# Patient Record
Sex: Male | Born: 1985
Health system: Southern US, Community
[De-identification: ages and names within clinical notes are randomized; demographics above are authoritative.]

## PROBLEM LIST (undated history)

## (undated) DIAGNOSIS — M109 Gout, unspecified: Secondary | ICD-10-CM

## (undated) DIAGNOSIS — F410 Panic disorder [episodic paroxysmal anxiety] without agoraphobia: Secondary | ICD-10-CM

## (undated) DIAGNOSIS — I2510 Atherosclerotic heart disease of native coronary artery without angina pectoris: Secondary | ICD-10-CM

## (undated) DIAGNOSIS — T7840XA Allergy, unspecified, initial encounter: Secondary | ICD-10-CM

## (undated) DIAGNOSIS — J45909 Unspecified asthma, uncomplicated: Secondary | ICD-10-CM

## (undated) DIAGNOSIS — N200 Calculus of kidney: Secondary | ICD-10-CM

## (undated) DIAGNOSIS — Z951 Presence of aortocoronary bypass graft: Secondary | ICD-10-CM

## (undated) HISTORY — PX: NO PAST SURGERIES: SHX2092

## (undated) HISTORY — PX: CARDIAC CATHETERIZATION: SHX172

## (undated) HISTORY — DX: Calculus of kidney: N20.0

## (undated) HISTORY — DX: Allergy, unspecified, initial encounter: T78.40XA

## (undated) HISTORY — DX: Unspecified asthma, uncomplicated: J45.909

## (undated) HISTORY — DX: Presence of aortocoronary bypass graft: Z95.1

## (undated) HISTORY — DX: Panic disorder (episodic paroxysmal anxiety): F41.0

---

## 2009-10-16 ENCOUNTER — Emergency Department (HOSPITAL_COMMUNITY): Admission: EM | Admit: 2009-10-16 | Discharge: 2009-10-16 | Payer: Self-pay | Admitting: Family Medicine

## 2012-09-21 ENCOUNTER — Ambulatory Visit: Payer: Self-pay | Admitting: Internal Medicine

## 2013-02-15 ENCOUNTER — Encounter: Payer: Self-pay | Admitting: Internal Medicine

## 2013-02-15 ENCOUNTER — Ambulatory Visit (INDEPENDENT_AMBULATORY_CARE_PROVIDER_SITE_OTHER): Payer: 59 | Admitting: Internal Medicine

## 2013-02-15 ENCOUNTER — Ambulatory Visit (INDEPENDENT_AMBULATORY_CARE_PROVIDER_SITE_OTHER): Payer: 59

## 2013-02-15 VITALS — BP 116/82 | HR 82 | Temp 98.3°F | Ht 70.0 in | Wt 245.1 lb

## 2013-02-15 DIAGNOSIS — Z0000 Encounter for general adult medical examination without abnormal findings: Secondary | ICD-10-CM

## 2013-02-15 DIAGNOSIS — E669 Obesity, unspecified: Secondary | ICD-10-CM

## 2013-02-15 DIAGNOSIS — Z23 Encounter for immunization: Secondary | ICD-10-CM

## 2013-02-15 LAB — CBC WITH DIFFERENTIAL/PLATELET
Basophils Absolute: 0 10*3/uL (ref 0.0–0.1)
Basophils Relative: 0.6 % (ref 0.0–3.0)
Eosinophils Absolute: 0.1 10*3/uL (ref 0.0–0.7)
Eosinophils Relative: 1.6 % (ref 0.0–5.0)
HCT: 46.3 % (ref 39.0–52.0)
Hemoglobin: 16.1 g/dL (ref 13.0–17.0)
Lymphocytes Relative: 31.9 % (ref 12.0–46.0)
Lymphs Abs: 2.2 10*3/uL (ref 0.7–4.0)
MCHC: 34.8 g/dL (ref 30.0–36.0)
MCV: 87.7 fl (ref 78.0–100.0)
Monocytes Absolute: 0.7 10*3/uL (ref 0.1–1.0)
Monocytes Relative: 9.9 % (ref 3.0–12.0)
Neutro Abs: 3.8 10*3/uL (ref 1.4–7.7)
Neutrophils Relative %: 56 % (ref 43.0–77.0)
Platelets: 317 10*3/uL (ref 150.0–400.0)
RBC: 5.28 Mil/uL (ref 4.22–5.81)
RDW: 12.9 % (ref 11.5–14.6)
WBC: 6.8 10*3/uL (ref 4.5–10.5)

## 2013-02-15 LAB — URINALYSIS, ROUTINE W REFLEX MICROSCOPIC
Bilirubin Urine: NEGATIVE
Ketones, ur: NEGATIVE
Leukocytes, UA: NEGATIVE
Nitrite: NEGATIVE
Specific Gravity, Urine: >=1.03 (ref 1.000–1.030)
Urine Glucose: NEGATIVE
Urobilinogen, UA: 0.2 (ref 0.0–1.0)
pH: 5.5 (ref 5.0–8.0)

## 2013-02-15 NOTE — Assessment & Plan Note (Signed)
Wt Readings from Last 3 Encounters:  02/15/13 245 lb 1.9 oz (111.186 kg)   The patient is asked to make an attempt to improve diet and exercise patterns to aid in medical management of this problem. Consider eval for OSA, especially if persisting concerns about ?HTN

## 2013-02-15 NOTE — Progress Notes (Signed)
  Subjective:    Patient ID: Michael Hart, male    DOB: 1985/11/15, 27 y.o.   MRN: [id number]  HPI  New patient to me, here today to establish care with PCP patient is here today for annual physical. Patient feels well and has no complaints.   Past Medical History  Diagnosis Date  . Asthma   . Allergy   . Hypertension   . Kidney stone   . Panic attacks     nocturnal, controls with self directed biofeedback   Family History  Problem Relation Age of Onset  . Breast cancer Mother   . Hypertension Other   . Stroke Other    History  Substance Use Topics  . Smoking status: Former Games developer  . Smokeless tobacco: Former Neurosurgeon    Quit date: 06/23/2011  . Alcohol Use: Yes    Review of Systems Constitutional: Negative for fever or unexpected weight change.  Respiratory: Negative for cough and shortness of breath.   Cardiovascular: Negative for chest pain or palpitations.  Gastrointestinal: Negative for abdominal pain, no bowel changes.  Musculoskeletal: Negative for gait problem or joint swelling.  Skin: Negative for rash.  Neurological: Negative for dizziness or headache.  No other specific complaints in a complete review of systems (except as listed in HPI above).     Objective:   Physical Exam BP 116/82  Pulse 82  Temp(Src) 98.3 F (36.8 C) (Oral)  Ht 5\' 10"  (1.778 m)  Wt 245 lb 1.9 oz (111.186 kg)  BMI 35.17 kg/m2  SpO2 97% Wt Readings from Last 3 Encounters:  02/15/13 245 lb 1.9 oz (111.186 kg)   Constitutional: he is overweight, but appears well-developed and well-nourished. No distress.  HENT: Head: Normocephalic and atraumatic. Ears: B TMs ok, no erythema or effusion; Nose: Nose normal. Mouth/Throat: Oropharynx is clear and moist. No oropharyngeal exudate.  Eyes: Conjunctivae and EOM are normal. Pupils are equal, round, and reactive to light. No scleral icterus.  Neck: thick; Normal range of motion. Neck supple. No JVD or LAD present. No thyromegaly present.   Cardiovascular: Normal rate, regular rhythm and normal heart sounds.  No murmur heard. No BLE edema. Pulmonary/Chest: Effort normal and breath sounds normal. No respiratory distress. he has no wheezes.  Abdominal: Soft. Bowel sounds are normal. he exhibits no distension. There is no tenderness. no masses Musculoskeletal: Normal range of motion, no joint effusions. No gross deformities Neurological: he is alert and oriented to person, place, and time. No cranial nerve deficit. Coordination, balance, strength, speech and gait are normal.  Skin: Skin is warm and dry. No rash noted. No erythema.  Psychiatric: he has a normal mood and affect. behavior is normal. Judgment and thought content normal.  No results found for this basename: WBC,  HGB,  HCT,  PLT,  GLUCOSE,  CHOL,  TRIG,  HDL,  LDLDIRECT,  LDLCALC,  ALT,  AST,  NA,  K,  CL,  CREATININE,  BUN,  CO2,  TSH,  PSA,  INR,  GLUF,  HGBA1C,  MICROALBUR       Assessment & Plan:   CPX/v70.0 - Patient has been counseled on age-appropriate routine health concerns for screening and prevention. These are reviewed and up-to-date. Immunizations are up-to-date or declined. Labs ordered and reviewed.  Also See problem list. Medications and labs reviewed today.

## 2013-02-15 NOTE — Patient Instructions (Addendum)
It was good to see you today. We have reviewed your prior records including labs and tests today Health Maintenance reviewed - Tdap undated today - consider the flu shot each fall -all other recommended immunizations and age-appropriate screenings are up-to-date. Test(s) ordered today. Your results will be released to MyChart (or called to you) after review, usually within 72hours after test completion. If any changes need to be made, you will be notified at that same time. Work on lifestyle changes as discussed (low fat, low carb, increased protein diet; improved exercise efforts; weight loss) to control sugar, blood pressure and cholesterol levels and/or reduce risk of developing other medical problems. Look into [email or web address] or other type of food journal to assist you in this process. Please schedule followup in 6 months for weight and blood pressure check, call sooner if problems.   Health Maintenance, Males A healthy lifestyle and preventative care can promote health and wellness.  Maintain regular health, dental, and eye exams.  Eat a healthy diet. Foods like vegetables, fruits, whole grains, low-fat dairy products, and lean protein foods contain the nutrients you need without too many calories. Decrease your intake of foods high in solid fats, added sugars, and salt. Get information about a proper diet from your caregiver, if necessary.  Regular physical exercise is one of the most important things you can do for your health. Most adults should get at least 150 minutes of moderate-intensity exercise (any activity that increases your heart rate and causes you to sweat) each week. In addition, most adults need muscle-strengthening exercises on 2 or more days a week.   Maintain a healthy weight. The body mass index (BMI) is a screening tool to identify possible weight problems. It provides an estimate of body fat based on height and weight. Your caregiver can help determine your BMI, and  can help you achieve or maintain a healthy weight. For adults 20 years and older:  A BMI below 18.5 is considered underweight.  A BMI of 18.5 to 24.9 is normal.  A BMI of 25 to 29.9 is considered overweight.  A BMI of 30 and above is considered obese.  Maintain normal blood lipids and cholesterol by exercising and minimizing your intake of saturated fat. Eat a balanced diet with plenty of fruits and vegetables. Blood tests for lipids and cholesterol should begin at age 24 and be repeated every 5 years. If your lipid or cholesterol levels are high, you are over 50, or you are a high risk for heart disease, you may need your cholesterol levels checked more frequently.Ongoing high lipid and cholesterol levels should be treated with medicines, if diet and exercise are not effective.  If you smoke, find out from your caregiver how to quit. If you do not use tobacco, do not start.  If you choose to drink alcohol, do not exceed 2 drinks per day. One drink is considered to be 12 ounces (355 mL) of beer, 5 ounces (148 mL) of wine, or 1.5 ounces (44 mL) of liquor.  Avoid use of street drugs. Do not share needles with anyone. Ask for help if you need support or instructions about stopping the use of drugs.  High blood pressure causes heart disease and increases the risk of stroke. Blood pressure should be checked at least every 1 to 2 years. Ongoing high blood pressure should be treated with medicines if weight loss and exercise are not effective.  If you are 42 to 29 years old, ask your caregiver if you  should take aspirin to prevent heart disease.  Diabetes screening involves taking a blood sample to check your fasting blood sugar level. This should be done once every 3 years, after age 60, if you are within normal weight and without risk factors for diabetes. Testing should be considered at a younger age or be carried out more frequently if you are overweight and have at least 1 risk factor for  diabetes.  Colorectal cancer can be detected and often prevented. Most routine colorectal cancer screening begins at the age of 64 and continues through age 60. However, your caregiver may recommend screening at an earlier age if you have risk factors for colon cancer. On a yearly basis, your caregiver may provide home test kits to check for hidden blood in the stool. Use of a small camera at the end of a tube, to directly examine the colon (sigmoidoscopy or colonoscopy), can detect the earliest forms of colorectal cancer. Talk to your caregiver about this at age 67, when routine screening begins. Direct examination of the colon should be repeated every 5 to 10 years through age 13, unless early forms of pre-cancerous polyps or small growths are found.  Hepatitis C blood testing is recommended for all people born from [birth year] through 1967 and any individual with known risks for hepatitis C.  Healthy men should no longer receive prostate-specific antigen (PSA) blood tests as part of routine cancer screening. Consult with your caregiver about prostate cancer screening.  Testicular cancer screening is not recommended for adolescents or adult males who have no symptoms. Screening includes self-exam, caregiver exam, and other screening tests. Consult with your caregiver about any symptoms you have or any concerns you have about testicular cancer.  Practice safe sex. Use condoms and avoid high-risk sexual practices to reduce the spread of sexually transmitted infections (STIs).  Use sunscreen with a sun protection factor (SPF) of 30 or greater. Apply sunscreen liberally and repeatedly throughout the day. You should seek shade when your shadow is shorter than you. Protect yourself by wearing long sleeves, pants, a wide-brimmed hat, and sunglasses year round, whenever you are outdoors.  Notify your caregiver of new moles or changes in moles, especially if there is a change in shape or color. Also notify your  caregiver if a mole is larger than the size of a pencil eraser.  A one-time screening for abdominal aortic aneurysm (AAA) and surgical repair of large AAAs by sound wave imaging (ultrasonography) is recommended for ages 6 to 82 years who are current or former smokers.  Stay current with your immunizations. Document Released: 12/05/2007 Document Revised: 08/31/2011 Document Reviewed: 11/03/2010 [facility] Patient Information 2014 [place], [place]. Exercise to Lose Weight Exercise and a healthy diet may help you lose weight. Your doctor may suggest specific exercises. EXERCISE IDEAS AND TIPS  Choose low-cost things you enjoy doing, such as walking, bicycling, or exercising to workout videos.  Take stairs instead of the elevator.  Walk during your lunch break.  Park your car further away from work or school.  Go to a gym or an exercise class.  Start with 5 to 10 minutes of exercise each day. Build up to 30 minutes of exercise 4 to 6 days a week.  Wear shoes with good support and comfortable clothes.  Stretch before and after working out.  Work out until you breathe harder and your heart beats faster.  Drink extra water when you exercise.  Do not do so much that you hurt yourself, feel dizzy,  or get very short of breath. Exercises that burn about 150 calories:  Running 1  miles in 15 minutes.  Playing volleyball for 45 to 60 minutes.  Washing and waxing a car for 45 to 60 minutes.  Playing touch football for 45 minutes.  Walking 1  miles in 35 minutes.  Pushing a stroller 1  miles in 30 minutes.  Playing basketball for 30 minutes.  Raking leaves for 30 minutes.  Bicycling 5 miles in 30 minutes.  Walking 2 miles in 30 minutes.  Dancing for 30 minutes.  Shoveling snow for 15 minutes.  Swimming laps for 20 minutes.  Walking up stairs for 15 minutes.  Bicycling 4 miles in 15 minutes.  Gardening for 30 to 45 minutes.  Jumping rope for 15 minutes.  Washing  windows or floors for 45 to 60 minutes. Document Released: 07/11/2010 Document Revised: 08/31/2011 Document Reviewed: 07/11/2010 [facility] Patient Information 2014 [place], [place].

## 2013-02-16 LAB — LIPID PANEL
Cholesterol: 246 mg/dL — ABNORMAL HIGH (ref 0–200)
HDL: 38.4 mg/dL — ABNORMAL LOW (ref >39.00)
Total CHOL/HDL Ratio: 6
Triglycerides: 262 mg/dL — ABNORMAL HIGH (ref 0.0–149.0)
VLDL: 52.4 mg/dL — ABNORMAL HIGH (ref 0.0–40.0)

## 2013-02-16 LAB — HEPATIC FUNCTION PANEL
ALT: 54 U/L — ABNORMAL HIGH (ref 0–53)
AST: 32 U/L (ref 0–37)
Albumin: 4.6 g/dL (ref 3.5–5.2)
Alkaline Phosphatase: 49 U/L (ref 39–117)
Bilirubin, Direct: 0.1 mg/dL (ref 0.0–0.3)
Total Bilirubin: 0.7 mg/dL (ref 0.3–1.2)
Total Protein: 7.9 g/dL (ref 6.0–8.3)

## 2013-02-16 LAB — BASIC METABOLIC PANEL
BUN: 15 mg/dL (ref 6–23)
CO2: 27 mEq/L (ref 19–32)
Calcium: 9.4 mg/dL (ref 8.4–10.5)
Chloride: 104 mEq/L (ref 96–112)
Creatinine, Ser: 0.9 mg/dL (ref 0.4–1.5)
GFR: 103.14 mL/min (ref >60.00)
Glucose, Bld: 85 mg/dL (ref 70–99)
Potassium: 4.3 mEq/L (ref 3.5–5.1)
Sodium: 138 mEq/L (ref 135–145)

## 2013-02-16 LAB — TSH: TSH: 2.7 u[IU]/mL (ref 0.35–5.50)

## 2013-02-17 LAB — LDL CHOLESTEROL, DIRECT: Direct LDL: 177.3 mg/dL

## 2013-02-17 NOTE — Progress Notes (Signed)
Spoke with pt advised of MDs result note. 

## 2013-08-16 ENCOUNTER — Ambulatory Visit (INDEPENDENT_AMBULATORY_CARE_PROVIDER_SITE_OTHER): Payer: Commercial Managed Care - PPO | Admitting: Internal Medicine

## 2013-08-16 ENCOUNTER — Encounter: Payer: Self-pay | Admitting: Internal Medicine

## 2013-08-16 VITALS — BP 122/78 | HR 91 | Temp 98.2°F | Wt 238.1 lb

## 2013-08-16 DIAGNOSIS — E669 Obesity, unspecified: Secondary | ICD-10-CM

## 2013-08-16 DIAGNOSIS — R030 Elevated blood-pressure reading, without diagnosis of hypertension: Secondary | ICD-10-CM

## 2013-08-16 DIAGNOSIS — Z23 Encounter for immunization: Secondary | ICD-10-CM

## 2013-08-16 DIAGNOSIS — IMO0001 Reserved for inherently not codable concepts without codable children: Secondary | ICD-10-CM

## 2013-08-16 NOTE — Progress Notes (Signed)
Pre-visit discussion using our clinic review tool. No additional management support is needed unless otherwise documented below in the visit note.  

## 2013-08-16 NOTE — Addendum Note (Signed)
Addended by: Deatra JamesBRAND, LUCY M on: 08/16/2013 02:45 PM   Modules accepted: Orders

## 2013-08-16 NOTE — Assessment & Plan Note (Signed)
Wt Readings from Last 3 Encounters:  08/16/13 238 lb 1.9 oz (108.011 kg)  02/15/13 245 lb 1.9 oz (111.186 kg)   Improving trend with increased attention to diet and exercise since summer 2014 The patient is asked to make continued efforts to improve diet and exercise patterns to aid in medical management of this problem. Consider eval for OSA, especially if persisting concerns about ?HTN

## 2013-08-16 NOTE — Progress Notes (Signed)
Michael AmsterdamWilliam H Hart [id number] 26, 1987 08/16/2013   Chief Complaint  Patient presents with  . Follow-up    6 months    Subjective  HPI Here for 6 mo follow up.  He is feeling well with no acute complaints.  He has had a recent viral illness with moderate rhinorrhea, but it is resolving and desires no treatment for it at this time.  Discussed heath goals in detail today.  He has been modifying his diet (1300-1500kcal / day) and exercising (30-45 min cardio + weight training) three days per week to lower weight, and control BP and hyperlipidema.  His weight did increase to 250 lb in January, but has since fallen to 238 today.  His goals are to avoid medical mgmt for BP and HLD and to be <220lb by his next visit in 6 mo.   Past Medical History  Diagnosis Date  . Asthma   . Allergy   . Hypertension   . Kidney stone   . Panic attacks     nocturnal, controls with self directed biofeedback    Past Surgical History  Procedure Laterality Date  . No past surgeries      Family History  Problem Relation Age of Onset  . Breast cancer Mother   . Hypertension Other   . Stroke Other     History  Substance Use Topics  . Smoking status: Former Games developermoker  . Smokeless tobacco: Former NeurosurgeonUser    Quit date: 06/23/2011  . Alcohol Use: Yes    No current outpatient prescriptions on file prior to visit.   No current facility-administered medications on file prior to visit.    Allergies:  Allergies  Allergen Reactions  . Penicillins   . Sulfa Antibiotics     Review of Systems  Constitutional: Negative for fever, chills and malaise/fatigue.  HENT: Positive for congestion (Recent moderately productive rhinorrhea.  Taking Tylenol Cold/Flu.  Resolving.  No treatment desired.). Negative for ear discharge and hearing loss.   Eyes: Negative for double vision and discharge.  Respiratory: Negative for cough, shortness of breath and wheezing.   Cardiovascular: Negative for chest pain, palpitations and  leg swelling.  Gastrointestinal: Negative for nausea, vomiting, abdominal pain, diarrhea and constipation.  Musculoskeletal: Negative for joint pain and myalgias.  Skin: Negative for rash.       Objective  Filed Vitals:   08/16/13 1310 08/16/13 1339  BP: 122/90 122/78  Pulse: 91   Temp: 98.2 F (36.8 C)   TempSrc: Oral   Weight: 238 lb 1.9 oz (108.011 kg)   SpO2: 97%     Physical Exam  Constitutional: He is oriented to person, place, and time. He appears well-developed and well-nourished. No distress.  Nontoxic  HENT:  Head: Normocephalic and atraumatic.  Mouth/Throat: Oropharynx is clear and moist. No oropharyngeal exudate.  Ears: TMs intact b/l, no erythema or effusion  Eyes: Conjunctivae and EOM are normal. Pupils are equal, round, and reactive to light. No scleral icterus.  Neck: Normal range of motion. Neck supple. No JVD present. No thyromegaly present.  Cardiovascular: Normal rate and regular rhythm.  Exam reveals no gallop and no friction rub.   No murmur heard. No edema b/l   Respiratory: Effort normal and breath sounds normal. No stridor. No respiratory distress. He has no wheezes. He has no rales.  GI: Soft. Bowel sounds are normal. He exhibits no distension. There is tenderness (Mild tenderness at R CVA w/ deep palpation.  No HSM.). There is no rebound and  no guarding.  Genitourinary:  Deferred  Musculoskeletal: Normal range of motion. He exhibits no edema and no tenderness.  Lymphadenopathy:    He has no cervical adenopathy.  Neurological: He is alert and oriented to person, place, and time.  Skin: He is not diaphoretic.  Psychiatric: He has a normal mood and affect. His behavior is normal. Judgment and thought content normal.    BP Readings from Last 3 Encounters:  08/16/13 122/78  02/15/13 116/82    Wt Readings from Last 3 Encounters:  08/16/13 238 lb 1.9 oz (108.011 kg)  02/15/13 245 lb 1.9 oz (111.186 kg)    Lab Results  Component Value Date    WBC 6.8 02/15/2013   HGB 16.1 02/15/2013   HCT 46.3 02/15/2013   PLT 317.0 02/15/2013   GLUCOSE 85 02/15/2013   CHOL 246* 02/15/2013   TRIG 262.0* 02/15/2013   HDL 38.40* 02/15/2013   LDLDIRECT 177.3 02/15/2013   ALT 54* 02/15/2013   AST 32 02/15/2013   NA 138 02/15/2013   K 4.3 02/15/2013   CL 104 02/15/2013   CREATININE 0.9 02/15/2013   BUN 15 02/15/2013   CO2 27 02/15/2013   TSH 2.70 02/15/2013    No results found.   Assessment and Plan  Elevated blood pressure - Normal today (122/78).  Lowered and appears to be controled with diet/excercise.  Continue to monitor.  No medical mgmt needed at this time.  Weight management / hyperlipidemia - Weight reduced from last visit.  Continue diet/excercise with goal of <220lbs in 6 months.  F/U labs for hyperlipidemia in 6 months.  No medical mgmt needed at this time.   Patient was instructed to notify the office if any new symptoms emerged or the current symptoms become worse.   No Follow-up on file. Evie Lacks    I have personally reviewed this case with PA student. I also personally examined this patient. I agree with history and findings as documented above. I reviewed, discussed and approve of the assessment and plan as listed above. Rene Paci, MD

## 2013-08-16 NOTE — Patient Instructions (Addendum)
It was good to see you today.  We have reviewed your prior records including labs and tests today  Your annual flu shot was given and/or updated today.  Medications reviewed and updated, no changes recommended at this time.  Please schedule followup in 6-9 months for annual exam - will check weight, blood pressure, labs and repeat seasonal flu shot -please call sooner if problems.  Exercise to Lose Weight Exercise and a healthy diet may help you lose weight. Your doctor may suggest specific exercises. EXERCISE IDEAS AND TIPS  Choose low-cost things you enjoy doing, such as walking, bicycling, or exercising to workout videos.  Take stairs instead of the elevator.  Walk during your lunch break.  Park your car further away from work or school.  Go to a gym or an exercise class.  Start with 5 to 10 minutes of exercise each day. Build up to 30 minutes of exercise 4 to 6 days a week.  Wear shoes with good support and comfortable clothes.  Stretch before and after working out.  Work out until you breathe harder and your heart beats faster.  Drink extra water when you exercise.  Do not do so much that you hurt yourself, feel dizzy, or get very short of breath. Exercises that burn about 150 calories:  Running 1  miles in 15 minutes.  Playing volleyball for 45 to 60 minutes.  Washing and waxing a car for 45 to 60 minutes.  Playing touch football for 45 minutes.  Walking 1  miles in 35 minutes.  Pushing a stroller 1  miles in 30 minutes.  Playing basketball for 30 minutes.  Raking leaves for 30 minutes.  Bicycling 5 miles in 30 minutes.  Walking 2 miles in 30 minutes.  Dancing for 30 minutes.  Shoveling snow for 15 minutes.  Swimming laps for 20 minutes.  Walking up stairs for 15 minutes.  Bicycling 4 miles in 15 minutes.  Gardening for 30 to 45 minutes.  Jumping rope for 15 minutes.  Washing windows or floors for 45 to 60 minutes. Document Released:  07/11/2010 Document Revised: 08/31/2011 Document Reviewed: 07/11/2010 [facility] Patient Information 2014 [place].

## 2013-08-16 NOTE — Progress Notes (Signed)
  Subjective:    Patient ID: Michael Hart, male    DOB: June 24, 1985, 28 y.o.   MRN: [id number]  HPI  Here for follow up - reviewed chronic medical issues and interval medical events   Past Medical History  Diagnosis Date  . Asthma   . Allergy   . Hypertension   . Kidney stone   . Panic attacks     nocturnal, controls with self directed biofeedback    Review of Systems  Constitutional: Negative for fever, fatigue and unexpected weight change.  Respiratory: Negative for shortness of breath. Cough: resolving URI sx.   Cardiovascular: Negative for chest pain and leg swelling.       Objective:   Physical Exam BP 122/90  Pulse 91  Temp(Src) 98.2 F (36.8 C) (Oral)  Wt 238 lb 1.9 oz (108.011 kg)  SpO2 97% Wt Readings from Last 3 Encounters:  08/16/13 238 lb 1.9 oz (108.011 kg)  02/15/13 245 lb 1.9 oz (111.186 kg)   Constitutional: he is overweight, but appears well-developed and well-nourished. No distress.  HENT: Head: Normocephalic and atraumatic. Ears: B TMs ok, no erythema or effusion; Nose: Nose normal. Mouth/Throat: Oropharynx is clear and moist. No oropharyngeal exudate.  Eyes: Conjunctivae and EOM are normal. Pupils are equal, round, and reactive to light. No scleral icterus.  Neck: thick; Normal range of motion. Neck supple. No JVD or LAD present. No thyromegaly present.  Cardiovascular: Normal rate, regular rhythm and normal heart sounds.  No murmur heard. No BLE edema. Pulmonary/Chest: Effort normal and breath sounds normal. No respiratory distress. he has no wheezes.  Abdominal: Soft. Bowel sounds are normal. he exhibits no distension. There is no tenderness. no masses Psychiatric: he has a normal mood and affect. behavior is normal. Judgment and thought content normal.  Lab Results  Component Value Date   WBC 6.8 02/15/2013   HGB 16.1 02/15/2013   HCT 46.3 02/15/2013   PLT 317.0 02/15/2013   GLUCOSE 85 02/15/2013   CHOL 246* 02/15/2013   TRIG 262.0* 02/15/2013    HDL 38.40* 02/15/2013   LDLDIRECT 177.3 02/15/2013   ALT 54* 02/15/2013   AST 32 02/15/2013   NA 138 02/15/2013   K 4.3 02/15/2013   CL 104 02/15/2013   CREATININE 0.9 02/15/2013   BUN 15 02/15/2013   CO2 27 02/15/2013   TSH 2.70 02/15/2013       Assessment & Plan:   Problem List Items Addressed This Visit   Elevated blood pressure      BP Readings from Last 3 Encounters:  08/16/13 122/78  02/15/13 116/82   Blood pressures have improved with diet and exercise changes Continue to monitor for risk of future hypertension    Obese - Primary      Wt Readings from Last 3 Encounters:  08/16/13 238 lb 1.9 oz (108.011 kg)  02/15/13 245 lb 1.9 oz (111.186 kg)   Improving trend with increased attention to diet and exercise since summer 2014 The patient is asked to make continued efforts to improve diet and exercise patterns to aid in medical management of this problem. Consider eval for OSA, especially if persisting concerns about ?HTN      Time spent with pt today 25 minutes, greater than 50% time spent counseling patient on weight mgmt, BP and medication review. Also review of prior records

## 2013-08-16 NOTE — Assessment & Plan Note (Signed)
BP Readings from Last 3 Encounters:  08/16/13 122/78  02/15/13 116/82   Blood pressures have improved with diet and exercise changes Continue to monitor for risk of future hypertension

## 2013-10-12 ENCOUNTER — Ambulatory Visit (INDEPENDENT_AMBULATORY_CARE_PROVIDER_SITE_OTHER): Payer: Commercial Managed Care - PPO | Admitting: Internal Medicine

## 2013-10-12 ENCOUNTER — Encounter: Payer: Self-pay | Admitting: Internal Medicine

## 2013-10-12 VITALS — BP 130/78 | HR 99 | Temp 98.0°F | Resp 13 | Wt 248.8 lb

## 2013-10-12 DIAGNOSIS — J029 Acute pharyngitis, unspecified: Secondary | ICD-10-CM

## 2013-10-12 LAB — POCT RAPID STREP A (OFFICE): Rapid Strep A Screen: NEGATIVE

## 2013-10-12 MED ORDER — AZITHROMYCIN 250 MG PO TABS: ORAL_TABLET | ORAL | Status: DC

## 2013-10-12 NOTE — Patient Instructions (Signed)
Zicam Melts or Zinc lozenges ; vitamin C 2000 mg daily; & Echinacea for 4-7 days. Report fever, exudate("pus") or progressive pain. Plain Mucinex (NOT D) for thick secretions ;force NON dairy fluids .   Nasal cleansing in the shower as discussed with lather of mild shampoo.After 10 seconds wash off lather while  exhaling through nostrils. Make sure that all residual soap is removed to prevent irritation.  Flonase OR Nasacort AQ 1 spray in each nostril twice a day as needed. Use the "crossover" technique into opposite nostril spraying toward opposite ear @ 45 degree angle, not straight up into nostril.  Use a Neti pot daily only  as needed for significant sinus congestion; going from open side to congested side . Plain Allegra (NOT D )  160 daily , Loratidine 10 mg , OR Zyrtec 10 mg @ bedtime  as needed for itchy eyes & sneezing.

## 2013-10-12 NOTE — Progress Notes (Signed)
   Subjective:    Patient ID: Michael Hart, male    DOB: May 02, 1986, 28 y.o.   MRN: [id number]  HPI   Symptoms began 10/10/13 as a sore throat.  His daughter is in daycare and now has an upper respiratory tract infection. He also has had pollen exposure  Tylenol & Mucinex have only been temporarily beneficial  At this time he has pain in his teeth and persistent left-sided sore throat. He also has pressure discomfort in the left ear.    Review of Systems   He specifically denies frontal headache, facial pain, nasal purulence, otic discharge, or significant cough and sputum production.  There is no shortness of breath or wheezing. He has no fever, chills, or sweats  He also denies itchy, watery eyes, sneezing.       Objective:   Physical Exam  General appearance:good health ;well nourished; no acute distress or increased work of breathing is present.  No  lymphadenopathy about the head, neck, or axilla noted; but there is tenderness to palpation of the left neck without definitive lymphadenopathy   Eyes: No conjunctival inflammation or lid edema is present. There is no scleral icterus.  Ears:  External ear exam shows no significant lesions or deformities.  Otoscopic examination reveals clear canals, tympanic membranes are intact bilaterally without bulging, retraction, inflammation or discharge.  Nose:  External nasal examination shows no deformity or inflammation. Nasal mucosa are pink and moist without lesions or exudates. No septal dislocation or deviation.No obstruction to airflow.   Oral exam: Dental hygiene is good; lips and gums are healthy appearing.Erythema of the pharynx which appears  Symmetric. There is no exudate noted.   Neck:  No deformities, thyromegaly, masses, or tenderness noted.   Supple with full range of motion without pain.   Heart:  Normal rate and regular rhythm. S1 and S2 normal without gallop, murmur, click, rub or other extra sounds.    Lungs:Chest clear to auscultation; no wheezes, rhonchi,rales ,or rubs present.No increased work of breathing.    Extremities:  No cyanosis, edema, or clubbing  noted    Skin: Warm & dry .         Assessment & Plan:  #1 upper respiratory infection with pharyngitis The [facility] criteria for pharyngitis which include fever, pharyngeal exudate, tender cervical  lymphadenopathy , and absence of cough were assessed. Plan: See orders and recommendations

## 2013-10-12 NOTE — Progress Notes (Signed)
Pre visit review using our clinic review tool, if applicable. No additional management support is needed unless otherwise documented below in the visit note. 

## 2014-03-14 ENCOUNTER — Ambulatory Visit: Payer: Commercial Managed Care - PPO | Admitting: Internal Medicine

## 2014-03-14 DIAGNOSIS — Z0289 Encounter for other administrative examinations: Secondary | ICD-10-CM

## 2014-05-24 ENCOUNTER — Encounter: Payer: Self-pay | Admitting: Family

## 2014-05-24 ENCOUNTER — Ambulatory Visit (INDEPENDENT_AMBULATORY_CARE_PROVIDER_SITE_OTHER): Payer: Commercial Managed Care - PPO | Admitting: Family

## 2014-05-24 VITALS — BP 172/86 | HR 80 | Temp 98.2°F | Resp 18 | Ht 69.0 in | Wt 239.0 lb

## 2014-05-24 DIAGNOSIS — R21 Rash and other nonspecific skin eruption: Secondary | ICD-10-CM

## 2014-05-24 MED ORDER — METHYLPREDNISOLONE (PAK) 4 MG PO TABS: ORAL_TABLET | ORAL | Status: DC

## 2014-05-24 NOTE — Progress Notes (Signed)
Pre visit review using our clinic review tool, if applicable. No additional management support is needed unless otherwise documented below in the visit note. 

## 2014-05-24 NOTE — Assessment & Plan Note (Signed)
Small red macules noted on hand and foot. Patient does recall having and mouth ulcer previously. Doubt hand-foot-and-mouth. Question possible insect bites. With itchiness will attempt Medrol Dosepak. Refer to dermatology for further evaluation. Follow up if symptoms worsen or fail to improve.

## 2014-05-24 NOTE — Patient Instructions (Addendum)
Thank you for choosing [facility].  Summary/Instructions:  Your prescription(s) have been submitted to your pharmacy. Please take as directed and contact our office if you believe you are having problem(s) with the medication(s).  Referrals have been made during this visit. You should expect to hear back from our schedulers in about 7-10 days in regards to establishing an appointment with the specialists we discussed.   If your symptoms worsen or fail to improve, please contact our office for further instruction, or in case of emergency go directly to the emergency room at the closest medical facility.     

## 2014-05-24 NOTE — Progress Notes (Signed)
   Subjective:    Patient ID: Michael Hart, male    DOB: 1985-08-26, 28 y.o.   MRN: [id number]  Chief Complaint  Patient presents with  . Rash    has little red bumps occuring on hands and bottoms of feet, sometimes other places x1 month   HPI:  Michael Hart is a [age] y.o. male who presents today an acute visit.   Acute symptoms started about a month ago. Indicates they are remodeling his home and were doing framing and sheet rock when he noticed some itchiness in his forearms. Benedryl was attempted, however only minimally improved itching. Since that time the rash has progressed to wax/wane on his hands and feet as well as his inguinal folds. States that some of the spots are painful with movement rating the intensity 5/10. Nothing really makes it better or worse.   Allergies  Allergen Reactions  . Penicillins   . Sulfa Antibiotics    Current Outpatient Prescriptions on File Prior to Visit  Medication Sig Dispense Refill  . acetaminophen (TYLENOL) 325 MG tablet Take 650 mg by mouth as needed.    Marland Kitchen. azithromycin (ZITHROMAX Z-PAK) 250 MG tablet 2 day 1, then 1 qd (Patient not taking: Reported on 05/24/2014) 6 each 0  . dextromethorphan-guaiFENesin (MUCINEX DM) 30-600 MG per 12 hr tablet Take 1 tablet by mouth as needed for cough.     No current facility-administered medications on file prior to visit.    Review of Systems    See HPI  Objective:    BP 172/86 mmHg  Pulse 80  Temp(Src) 98.2 F (36.8 C) (Oral)  Resp 18  Ht 5\' 9"  (1.753 m)  Wt 239 lb (108.41 kg)  BMI 35.28 kg/m2  SpO2 96% Nursing note and vital signs reviewed.  Physical Exam  Constitutional: He is oriented to person, place, and time. He appears well-developed and well-nourished. No distress.  Cardiovascular: Normal rate, regular rhythm, normal heart sounds and intact distal pulses.   Pulmonary/Chest: Effort normal and breath sounds normal.  Neurological: He is alert and oriented to person, place, and  time.  Skin: Skin is warm and dry.  Small macule/papule shaped spots spread sporadically around hands and soles of feet. Blanchable, with no discharge. Not currently located in inguinal areas.   Psychiatric: He has a normal mood and affect. His behavior is normal. Judgment and thought content normal.       Assessment & Plan:

## 2014-06-02 ENCOUNTER — Emergency Department (INDEPENDENT_AMBULATORY_CARE_PROVIDER_SITE_OTHER)
Admission: EM | Admit: 2014-06-02 | Discharge: 2014-06-02 | Disposition: A | Discharge status: Home / Self Care | Payer: Commercial Managed Care - PPO | Source: Home / Self Care | Attending: Family Medicine | Admitting: Family Medicine

## 2014-06-02 ENCOUNTER — Encounter (HOSPITAL_COMMUNITY): Payer: Self-pay

## 2014-06-02 DIAGNOSIS — M10072 Idiopathic gout, left ankle and foot: Secondary | ICD-10-CM

## 2014-06-02 DIAGNOSIS — M109 Gout, unspecified: Secondary | ICD-10-CM

## 2014-06-02 MED ORDER — HYDROCODONE-ACETAMINOPHEN 5-325 MG PO TABS: 1.0000 | ORAL_TABLET | ORAL | Status: DC | PRN

## 2014-06-02 MED ORDER — COLCHICINE 0.6 MG PO CAPS: ORAL_CAPSULE | ORAL | Status: DC

## 2014-06-02 MED ORDER — COLCHICINE 0.6 MG PO TABS: 0.6000 mg | ORAL_TABLET | Freq: Two times a day (BID) | ORAL | Status: DC

## 2014-06-02 NOTE — ED Provider Notes (Signed)
Left ankle Limited musculoskeletal ultrasound: Achilles tendon is normal appearing at insertion onto the calcaneus. No retrocalcaneal bursitis visible. Talar joint effusion present. Normal posterior tibialis tendon appearance.  No bony injuries noted.   Rodolph BongEvan S Deliah Strehlow, MD 06/02/14 [phone]

## 2014-06-02 NOTE — ED Provider Notes (Signed)
CSN: [id number]     Arrival date & time 06/02/14  1758 History   First MD Initiated Contact with Patient 06/02/14 1821     Chief Complaint  Patient presents with  . Foot Pain   (Consider location/radiation/quality/duration/timing/severity/associated sxs/prior Treatment) Patient is a 28 y.o. male presenting with lower extremity pain.  Foot Pain         28 year old male presents for evaluation of left ankle pain. This started initially yesterday evening and has gotten much worse since it began. He has pain in the medial part of the left ankle that radiates around the top of the ankle. It feels swollen. As the pain is gotten worse today, it has become more difficult to bear weight. He can no longer bear his full weight on the left leg. He notes that he had this once before, earlier this year. He went to his doctor and had x-ray which was negative, he was told to take Tylenol. He used crutches and he got better in about a week. He denies any injury. No other areas of pain or swelling. No personal or family history of gout. No recent travel or sick contacts. No personal or family history of rheumatoid conditions or other autoimmune conditions. No systemic symptoms.    Past Medical History  Diagnosis Date  . Asthma   . Allergy   . Hypertension   . Kidney stone   . Panic attacks     nocturnal, controls with self directed biofeedback   Past Surgical History  Procedure Laterality Date  . No past surgeries     Family History  Problem Relation Age of Onset  . Breast cancer Mother   . Hypertension Other   . Stroke Other    History  Substance Use Topics  . Smoking status: Former Games developermoker  . Smokeless tobacco: Former NeurosurgeonUser    Quit date: 06/23/2011  . Alcohol Use: Yes    Review of Systems  Musculoskeletal: Positive for arthralgias (left ankle pain and swelling).  All other systems reviewed and are negative.   Allergies  Penicillins and Sulfa antibiotics  Home Medications   Prior to  Admission medications   Medication Sig Start Date End Date Taking? Authorizing Provider  acetaminophen (TYLENOL) 325 MG tablet Take 650 mg by mouth as needed.    Historical Provider, MD  azithromycin (ZITHROMAX Z-PAK) 250 MG tablet 2 day 1, then 1 qd Patient not taking: Reported on 05/24/2014 10/12/13   Pecola LawlessWilliam F Hopper, MD  Colchicine 0.6 MG CAPS 1 capsule every 12 hours as needed for any ankle pain or swelling 06/02/14   Graylon GoodZachary H Aissatou Fronczak, PA-C  dextromethorphan-guaiFENesin [facility](MUCINEX DM) 30-600 MG per 12 hr tablet Take 1 tablet by mouth as needed for cough.    Historical Provider, MD  HYDROcodone-acetaminophen (NORCO) 5-325 MG per tablet Take 1 tablet by mouth every 4 (four) hours as needed for moderate pain. 06/02/14   Graylon GoodZachary H Alexxander Kurt, PA-C  methylPREDNIsolone (MEDROL DOSPACK) 4 MG tablet follow package directions 05/24/14   Jeanine LuzGregory Calone, FNP   BP 130/94 mmHg  Pulse 108  Temp(Src) 98.4 F (36.9 C) (Oral)  Resp 18  SpO2 98% Physical Exam  Constitutional: He is oriented to person, place, and time. He appears well-developed and well-nourished. No distress.  HENT:  Head: Normocephalic.  Cardiovascular:  Pulses:      Dorsalis pedis pulses are 2+ on the left side.  Pulmonary/Chest: Effort normal. No respiratory distress.  Musculoskeletal:       Left ankle: He  exhibits decreased range of motion (Decreased plantar flexion and decreased dorsiflexion, decreased internal or external rotation) and swelling (Effusion). He exhibits no ecchymosis, no deformity, no laceration and normal pulse. Tenderness (Medial joint line). Achilles tendon normal.       Left foot: Normal.  Neurological: He is alert and oriented to person, place, and time. He has normal strength. No sensory deficit. Coordination normal.  Skin: Skin is warm and dry. No rash noted. He is not diaphoretic.  Psychiatric: He has a normal mood and affect. Judgment normal.  Nursing note and vitals reviewed.   ED Course  Procedures  (including critical care time) Labs Review Labs Reviewed - No data to display  Imaging Review No results found.  Ultrasound done by Dr. Denyse Amassorey shows nonspecific joint effusion, no ligamentous injury  MDM   1. Acute gout of left ankle, unspecified cause    Patient is afebrile, nontoxic. No erythema. He has no reason to have a joint infection. The most likely diagnosis at this point, especially given the recurrence, is gout. Will treat with colchicine and pain medications. He is advised to use crutches and stay off the foot until starts feeling better. He will follow up with his primary care doctor in a month for consideration for uric acid lowering medications. He may follow-up here if no improvement in 2 days   Meds ordered this encounter  Medications  . DISCONTD: colchicine 0.6 MG tablet    Sig: Take 1 tablet (0.6 mg total) by mouth 2 (two) times daily.    Dispense:  30 tablet    Refill:  0    Order Specific Question:  Supervising Provider    Answer:  Linna HoffKINDL, JAMES D [phone]]  . DISCONTD: HYDROcodone-acetaminophen (NORCO) 5-325 MG per tablet    Sig: Take 1 tablet by mouth every 4 (four) hours as needed for moderate pain.    Dispense:  20 tablet    Refill:  0    Order Specific Question:  Supervising Provider    Answer:  Linna HoffKINDL, JAMES D [phone][[id number]]  . HYDROcodone-acetaminophen (NORCO) 5-325 MG per tablet    Sig: Take 1 tablet by mouth every 4 (four) hours as needed for moderate pain.    Dispense:  20 tablet    Refill:  0    Order Specific Question:  Supervising Provider    Answer:  Linna HoffKINDL, JAMES D [phone][[id number]]  . Colchicine 0.6 MG CAPS    Sig: 1 capsule every 12 hours as needed for any ankle pain or swelling    Dispense:  30 capsule    Refill:  0    Order Specific Question:  Supervising Provider    Answer:  Bradd CanaryKINDL, JAMES D [[id number]]      Graylon GoodZachary H Cola Highfill, PA-C 06/02/14 1923

## 2014-06-02 NOTE — ED Notes (Signed)
Denies injury. Woke w pain in left foot, ankle. Had something like this a few months ago, but they never decided what the problem was

## 2014-06-02 NOTE — Discharge Instructions (Signed)
Gout Gout is an inflammatory arthritis caused by a buildup of uric acid crystals in the joints. Uric acid is a chemical that is normally present in the blood. When the level of uric acid in the blood is too high it can form crystals that deposit in your joints and tissues. This causes joint redness, soreness, and swelling (inflammation). Repeat attacks are common. Over time, uric acid crystals can form into masses (tophi) near a joint, destroying bone and causing disfigurement. Gout is treatable and often preventable. CAUSES  The disease begins with elevated levels of uric acid in the blood. Uric acid is produced by your body when it breaks down a naturally found substance called purines. Certain foods you eat, such as meats and fish, contain high amounts of purines. Causes of an elevated uric acid level include:  Being passed down from parent to child (heredity).  Diseases that cause increased uric acid production (such as obesity, psoriasis, and certain cancers).  Excessive alcohol use.  Diet, especially diets rich in meat and seafood.  Medicines, including certain cancer-fighting medicines (chemotherapy), water pills (diuretics), and aspirin.  Chronic kidney disease. The kidneys are no longer able to remove uric acid well.  Problems with metabolism. Conditions strongly associated with gout include:  Obesity.  High blood pressure.  High cholesterol.  Diabetes. Not everyone with elevated uric acid levels gets gout. It is not understood why some people get gout and others do not. Surgery, joint injury, and eating too much of certain foods are some of the factors that can lead to gout attacks. SYMPTOMS   An attack of gout comes on quickly. It causes intense pain with redness, swelling, and warmth in a joint.  Fever can occur.  Often, only one joint is involved. Certain joints are more commonly involved:  Base of the big toe.  Knee.  Ankle.  Wrist.  Finger. Without  treatment, an attack usually goes away in a few days to weeks. Between attacks, you usually will not have symptoms, which is different from many other forms of arthritis. DIAGNOSIS  Your caregiver will suspect gout based on your symptoms and exam. In some cases, tests may be recommended. The tests may include:  Blood tests.  Urine tests.  X-rays.  Joint fluid exam. This exam requires a needle to remove fluid from the joint (arthrocentesis). Using a microscope, gout is confirmed when uric acid crystals are seen in the joint fluid. TREATMENT  There are two phases to gout treatment: treating the sudden onset (acute) attack and preventing attacks (prophylaxis).  Treatment of an Acute Attack.  Medicines are used. These include anti-inflammatory medicines or steroid medicines.  An injection of steroid medicine into the affected joint is sometimes necessary.  The painful joint is rested. Movement can worsen the arthritis.  You may use warm or cold treatments on painful joints, depending which works best for you.  Treatment to Prevent Attacks.  If you suffer from frequent gout attacks, your caregiver may advise preventive medicine. These medicines are started after the acute attack subsides. These medicines either help your kidneys eliminate uric acid from your body or decrease your uric acid production. You may need to stay on these medicines for a very long time.  The early phase of treatment with preventive medicine can be associated with an increase in acute gout attacks. For this reason, during the first few months of treatment, your caregiver may also advise you to take medicines usually used for acute gout treatment. Be sure you   understand your caregiver's directions. Your caregiver may make several adjustments to your medicine dose before these medicines are effective.  Discuss dietary treatment with your caregiver or dietitian. Alcohol and drinks high in sugar and fructose and foods  such as meat, poultry, and seafood can increase uric acid levels. Your caregiver or dietitian can advise you on drinks and foods that should be limited. HOME CARE INSTRUCTIONS   Do not take aspirin to relieve pain. This raises uric acid levels.  Only take over-the-counter or prescription medicines for pain, discomfort, or fever as directed by your caregiver.  Rest the joint as much as possible. When in bed, keep sheets and blankets off painful areas.  Keep the affected joint raised (elevated).  Apply warm or cold treatments to painful joints. Use of warm or cold treatments depends on which works best for you.  Use crutches if the painful joint is in your leg.  Drink enough fluids to keep your urine clear or pale yellow. This helps your body get rid of uric acid. Limit alcohol, sugary drinks, and fructose drinks.  Follow your dietary instructions. Pay careful attention to the amount of protein you eat. Your daily diet should emphasize fruits, vegetables, whole grains, and fat-free or low-fat milk products. Discuss the use of coffee, vitamin C, and cherries with your caregiver or dietitian. These may be helpful in lowering uric acid levels.  Maintain a healthy body weight. SEEK MEDICAL CARE IF:   You develop diarrhea, vomiting, or any side effects from medicines.  You do not feel better in 24 hours, or you are getting worse. SEEK IMMEDIATE MEDICAL CARE IF:   Your joint becomes suddenly more tender, and you have chills or a fever. MAKE SURE YOU:   Understand these instructions.  Will watch your condition.  Will get help right away if you are not doing well or get worse. Document Released: 06/05/2000 Document Revised: 10/23/2013 Document Reviewed: 01/20/2012 [employer] Patient Information 2015 [employer]. This information is not intended to replace advice given to you by your health care provider. Make sure you discuss any questions you have with your health care  provider. Low-Purine Diet Purines are compounds that affect the level of uric acid in your body. A low-purine diet is a diet that is low in purines. Eating a low-purine diet can prevent the level of uric acid in your body from getting too high and causing gout or kidney stones or both. WHAT DO I NEED TO KNOW ABOUT THIS DIET?  Choose low-purine foods. Examples of low-purine foods are listed in the next section.  Drink plenty of fluids, especially water. Fluids can help remove uric acid from your body. Try to drink 8-16 cups (1.9-3.8 L) a day.  Limit foods high in fat, especially saturated fat, as fat makes it harder for the body to get rid of uric acid. Foods high in saturated fat include pizza, cheese, ice cream, whole milk, fried foods, and gravies. Choose foods that are lower in fat and lean sources of protein. Use olive oil when cooking as it contains healthy fats that are not high in saturated fat.  Limit alcohol. Alcohol interferes with the elimination of uric acid from your body. If you are having a gout attack, avoid all alcohol.  Keep in mind that different people's bodies react differently to different foods. You will probably learn over time which foods do or do not affect you. If you discover that a food tends to cause your gout to flare up,   avoid eating that food. You can more freely enjoy foods that do not cause problems. If you have any questions about a food item, talk to your dietitian or health care provider. WHICH FOODS ARE LOW, MODERATE, AND HIGH IN PURINES? The following is a list of foods that are low, moderate, and high in purines. You can eat any amount of the foods that are low in purines. You may be able to have small amounts of foods that are moderate in purines. Ask your health care provider how much of a food moderate in purines you can have. Avoid foods high in purines. Grains  Foods low in purines: Enriched white bread, pasta, rice, cake, cornbread, popcorn.  Foods  moderate in purines: Whole-grain breads and cereals, wheat germ, bran, oatmeal. Uncooked oatmeal. Dry wheat bran or wheat germ.  Foods high in purines: Pancakes, French toast, biscuits, muffins. Vegetables  Foods low in purines: All vegetables, except those that are moderate in purines.  Foods moderate in purines: Asparagus, cauliflower, spinach, mushrooms, green peas. Fruits  All fruits are low in purines. Meats and other Protein Foods  Foods low in purines: Eggs, nuts, peanut butter.  Foods moderate in purines: 80-90% lean beef, lamb, veal, pork, poultry, fish, eggs, peanut butter, nuts. Crab, lobster, oysters, and shrimp. Cooked dried beans, peas, and lentils.  Foods high in purines: Anchovies, sardines, herring, mussels, tuna, codfish, scallops, trout, and haddock. Bacon. Organ meats (such as liver or kidney). Tripe. Game meat. Goose. Sweetbreads. Dairy  All dairy foods are low in purines. Low-fat and fat-free dairy products are best because they are low in saturated fat. Beverages  Drinks low in purines: Water, carbonated beverages, tea, coffee, cocoa.  Drinks moderate in purines: Soft drinks and other drinks sweetened with high-fructose corn syrup. Juices. To find whether a food or drink is sweetened with high-fructose corn syrup, look at the ingredients list.  Drinks high in purines: Alcoholic beverages (such as beer). Condiments  Foods low in purines: Salt, herbs, olives, pickles, relishes, vinegar.  Foods moderate in purines: Butter, margarine, oils, mayonnaise. Fats and Oils  Foods low in purines: All types, except gravies and sauces made with meat.  Foods high in purines: Gravies and sauces made with meat. Other Foods  Foods low in purines: Sugars, sweets, gelatin. Cake. Soups made without meat.  Foods moderate in purines: Meat-based or fish-based soups, broths, or bouillons. Foods and drinks sweetened with high-fructose corn syrup.  Foods high in purines:  High-fat desserts (such as ice cream, cookies, cakes, pies, doughnuts, and chocolate). Contact your dietitian for more information on foods that are not listed here. Document Released: 10/03/2010 Document Revised: 06/13/2013 Document Reviewed: 05/15/2013 [employer] Patient Information 2015 [employer]. This information is not intended to replace advice given to you by your health care provider. Make sure you discuss any questions you have with your health care provider.  

## 2014-10-28 ENCOUNTER — Encounter (HOSPITAL_COMMUNITY): Payer: Self-pay | Admitting: *Deleted

## 2014-10-28 ENCOUNTER — Emergency Department (INDEPENDENT_AMBULATORY_CARE_PROVIDER_SITE_OTHER)
Admission: EM | Admit: 2014-10-28 | Discharge: 2014-10-28 | Disposition: A | Discharge status: Home / Self Care | Payer: 59 | Source: Home / Self Care | Attending: Family Medicine | Admitting: Family Medicine

## 2014-10-28 DIAGNOSIS — B084 Enteroviral vesicular stomatitis with exanthem: Secondary | ICD-10-CM | POA: Diagnosis not present

## 2014-10-28 HISTORY — DX: Gout, unspecified: M10.9

## 2014-10-28 NOTE — ED Notes (Signed)
Started with pruritic, red, spotty rash to palms and hands yesterday.  Also has few spots on soles of feet.  Hand and feet are slightly painful when clenching fists and bearing weight.  States his child has one spot on her hand.  Concerned because he has a newborn at home.  Took Benadryl.

## 2014-10-28 NOTE — ED Provider Notes (Signed)
CSN: [id number]     Arrival date & time 10/28/14  0909 History   First MD Initiated Contact with Patient 10/28/14 1016     Chief Complaint  Patient presents with  . Rash   (Consider location/radiation/quality/duration/timing/severity/associated sxs/prior Treatment) Patient is a 29 y.o. male presenting with rash. The history is provided by the patient.  Rash Location:  Foot and hand Hand rash location:  L hand and R hand Foot rash location:  L foot and R foot Quality: itchiness, painful and redness   Pain details:    Quality:  Itching and sore   Severity:  Mild   Duration:  1 day Severity:  Mild Onset quality:  Sudden Chronicity:  New Context: sick contacts   Context comment:  Child with similar.   Past Medical History  Diagnosis Date  . Asthma   . Allergy   . Kidney stone   . Panic attacks     nocturnal, controls with self directed biofeedback  . Gout    Past Surgical History  Procedure Laterality Date  . No past surgeries     Family History  Problem Relation Age of Onset  . Breast cancer Mother   . Hypertension Other   . Stroke Other    History  Substance Use Topics  . Smoking status: Former Games developermoker  . Smokeless tobacco: Former NeurosurgeonUser    Quit date: 06/23/2011  . Alcohol Use: Yes     Comment: occasional    Review of Systems  Constitutional: Negative.   Skin: Positive for rash.    Allergies  Penicillins and Sulfa antibiotics  Home Medications   Prior to Admission medications   Medication Sig Start Date End Date Taking? Authorizing Provider  acetaminophen (TYLENOL) 325 MG tablet Take 650 mg by mouth as needed.    Historical Provider, MD  azithromycin (ZITHROMAX Z-PAK) 250 MG tablet 2 day 1, then 1 qd Patient not taking: Reported on 05/24/2014 10/12/13   Pecola LawlessWilliam F Hopper, MD  Colchicine 0.6 MG CAPS 1 capsule every 12 hours as needed for any ankle pain or swelling 06/02/14   Graylon GoodZachary H Baker, PA-C  dextromethorphan-guaiFENesin [facility](MUCINEX DM) 30-600 MG per 12 hr  tablet Take 1 tablet by mouth as needed for cough.    Historical Provider, MD  HYDROcodone-acetaminophen (NORCO) 5-325 MG per tablet Take 1 tablet by mouth every 4 (four) hours as needed for moderate pain. 06/02/14   Graylon GoodZachary H Baker, PA-C  methylPREDNIsolone (MEDROL DOSPACK) 4 MG tablet follow package directions 05/24/14   Veryl SpeakGregory D Calone, FNP   BP 141/93 mmHg  Pulse 77  Temp(Src) 98 F (36.7 C) (Oral)  Resp 16  SpO2 97% Physical Exam  Constitutional: He is oriented to person, place, and time. He appears well-developed and well-nourished.  HENT:  Mouth/Throat: Oropharynx is clear and moist.  Neck: Normal range of motion. Neck supple.  Musculoskeletal: He exhibits tenderness.  Red papular rash primarily on palms and soles, sore with rom, no oral lesions, no adenopathy.  Lymphadenopathy:    He has no cervical adenopathy.  Neurological: He is alert and oriented to person, place, and time.  Skin: Skin is warm. Rash noted.  Nursing note and vitals reviewed.   ED Course  Procedures (including critical care time) Labs Review Labs Reviewed - No data to display  Imaging Review No results found.   MDM   1. Hand, foot and mouth disease        Linna HoffJames D Kindl, MD 10/28/14 1030

## 2014-12-04 ENCOUNTER — Ambulatory Visit: Payer: 59 | Admitting: Internal Medicine

## 2014-12-05 ENCOUNTER — Encounter: Payer: Self-pay | Admitting: Internal Medicine

## 2014-12-05 ENCOUNTER — Ambulatory Visit (INDEPENDENT_AMBULATORY_CARE_PROVIDER_SITE_OTHER): Payer: 59 | Admitting: Internal Medicine

## 2014-12-05 VITALS — BP 130/86 | HR 75 | Temp 97.5°F | Resp 18 | Wt 235.0 lb

## 2014-12-05 DIAGNOSIS — K219 Gastro-esophageal reflux disease without esophagitis: Secondary | ICD-10-CM

## 2014-12-05 DIAGNOSIS — M2669 Other specified disorders of temporomandibular joint: Secondary | ICD-10-CM

## 2014-12-05 DIAGNOSIS — H539 Unspecified visual disturbance: Secondary | ICD-10-CM

## 2014-12-05 DIAGNOSIS — E782 Mixed hyperlipidemia: Secondary | ICD-10-CM | POA: Diagnosis not present

## 2014-12-05 DIAGNOSIS — M26629 Arthralgia of temporomandibular joint, unspecified side: Secondary | ICD-10-CM

## 2014-12-05 NOTE — Progress Notes (Signed)
   Subjective:    Patient ID: Michael Hart, male    DOB: [birth year], 29 y.o.   MRN: [id number]  HPI The last 2 days left jaw pain at rest. It does radiate into the left ear. It is  rated as a level III and described as throbbing. Ibuprofen 2 tabs twice a day has provided moderate relief.  While sitting at computer for at least 2 hours he noted some "dizziness". He felt "foggy or cloudy" but there was no frank vertigo. He felt as if his eyes were "losing focus". There was no associated cardiac or neurologic prodrome prior to the symptoms. When he dimmed the computer screen and increased the distance from the computer visual symptoms improved moderately.Marland Kitchen  He also has had some reflux with gas and "belching". The latter does improve the symptoms.  He has been concerned as his father recently has had porcine valve replacement. Maternal uncle had a stent in his 30s. Paternal grandmother had heart attack in 20s and paternal great grandfather had heart attack in his 30s. His last lipids on record were 8/14. Triglycerides were 262; HDL 38.4; and LDL 177.3.  He has only taken ibuprofen the last 3 days. He has 3 cups of coffee a day. He drinks a sixpack of beer every 2 weeks.  He denies constitutional symptoms or other GI symptoms.   Review of Systems  Denied were any change in heart rhythm or rate prior to the event. There was no associated chest pain or shortness of breath .  Also specifically denied prior to the episode were headache, limb weakness, tingling, or numbness. No seizure activity noted.  Unexplained weight loss, abdominal pain, dysphagia, melena, rectal bleeding, or persistently small caliber stools are denied.     Objective:   Physical Exam   Pertinent or positive findings include: There is slight lateralization of the tuning fork to the right. He has classic TMJ on the left with some dislocation. Abdomen is protuberant. He has mild crepitus of the knees.  General appearance  :adequately nourished; in no distress. BMI:34.69. Eyes: No conjunctival inflammation or scleral icterus is present.  Oral exam:  Lips and gums are healthy appearing.There is no oropharyngeal erythema or exudate noted. Dental hygiene is good.  Heart:  Normal rate and regular rhythm. S1 and S2 normal without gallop, murmur, click, rub or other extra sounds    Lungs:Chest clear to auscultation; no wheezes, rhonchi,rales ,or rubs present.No increased work of breathing.   Abdomen: bowel sounds normal, soft and non-tender without masses, organomegaly or hernias noted.  No guarding or rebound.  Vascular : all pulses equal ; no bruits present.  Skin:Warm & dry.  Intact without suspicious lesions or rashes ; no tenting or jaundice   Lymphatic: No lymphadenopathy is noted about the head, neck, axilla  Neuro: Strength, tone & DTRs normal.         Assessment & Plan:  #1 temporomandibular joint dysfunction on the left  #2 visual dysfunction most likely related to prolonged computer use   #3 mixed dyslipidemia with family history of premature coronary artery disease.  #4 GERD  Plan: See after visit summary. I asked he discuss the jaw symptoms with his Dentist; he has appointment tomorrow. Nutritional interventions were discussed in detail. Advanced lipid testing after 4 months was recommended.

## 2014-12-05 NOTE — Progress Notes (Signed)
Pre visit review using our clinic review tool, if applicable. No additional management support is needed unless otherwise documented below in the visit note. 

## 2014-12-05 NOTE — Patient Instructions (Addendum)
Soft or liquid diet if having pain at the temporomandibular joints. Avoid tough foods and avoid chewing gum. Use warm moist compresses to 3 times a day over the painful area. If symptoms persist; please consider evaluation by a Dentist for possible nighttime prosthesis to prevent grinding of teeth.   Please follow a Mediaterranean type diet  (many good cook books readily available) or review Dr Gildardo Griffes book Eat, Drink & Be Healthy for best  dietary cholesterol information & options.  Cardiovascular exercise, this can be as simple a program as walking, is recommended 30-45 minutes 3-4 times per week. If you're not exercising you should take 6-8 weeks to build up to this level.  An advanced cholesterol panel (NMR Lipoprofile Lipid Panel) after 4 months of nutritional & exercise changes is best way to optimally assess long LDL risk.   The most common cause of elevated triglycerides (TG) is the ingestion of sugar from high fructose corn syrup sources added to processed foods & drinks.  Eat a low-fat diet with lots of fruits and vegetables, up to 7-9 servings per day. Consume less than 40 Grams (preferably ZERO) of sugar per day from foods & drinks with High Fructose Corn Syrup (HFCS) sugar as #1,2,3 or # 4 on label.Whole Foods, [employer] & [employer] do not carry products with HFCS.

## 2015-04-03 ENCOUNTER — Ambulatory Visit: Payer: 59 | Admitting: Internal Medicine

## 2015-04-10 ENCOUNTER — Ambulatory Visit: Payer: 59 | Admitting: Internal Medicine

## 2015-04-10 DIAGNOSIS — R4689 Other symptoms and signs involving appearance and behavior: Secondary | ICD-10-CM | POA: Insufficient documentation

## 2015-07-01 MED FILL — CLOMIPHENE CITRATE 50 MG TA: 50 | 28 days supply | Qty: 8 | Fill #2

## 2015-08-16 MED FILL — CLOMIPHENE CITRATE 50 MG TA: 50 | 28 days supply | Qty: 8 | Fill #3

## 2015-08-21 ENCOUNTER — Encounter: Payer: Self-pay | Admitting: Internal Medicine

## 2015-09-18 MED FILL — CLOMIPHENE CITRATE 50 MG TA: 50 | 28 days supply | Qty: 8 | Fill #4

## 2015-10-01 ENCOUNTER — Telehealth: Payer: Self-pay | Admitting: Internal Medicine

## 2015-10-01 ENCOUNTER — Ambulatory Visit: Payer: 59 | Admitting: Internal Medicine

## 2015-10-01 DIAGNOSIS — Z0289 Encounter for other administrative examinations: Secondary | ICD-10-CM

## 2015-10-01 NOTE — Telephone Encounter (Signed)
Error

## 2015-10-22 ENCOUNTER — Encounter: Payer: Self-pay | Admitting: Internal Medicine

## 2015-10-22 ENCOUNTER — Ambulatory Visit (INDEPENDENT_AMBULATORY_CARE_PROVIDER_SITE_OTHER): Payer: 59 | Admitting: Internal Medicine

## 2015-10-22 VITALS — BP 124/80 | HR 77 | Temp 98.3°F | Resp 16 | Ht 70.0 in | Wt 237.0 lb

## 2015-10-22 DIAGNOSIS — R7989 Other specified abnormal findings of blood chemistry: Secondary | ICD-10-CM | POA: Insufficient documentation

## 2015-10-22 DIAGNOSIS — Z8639 Personal history of other endocrine, nutritional and metabolic disease: Secondary | ICD-10-CM | POA: Diagnosis not present

## 2015-10-22 DIAGNOSIS — E291 Testicular hypofunction: Secondary | ICD-10-CM | POA: Diagnosis not present

## 2015-10-22 DIAGNOSIS — Z8739 Personal history of other diseases of the musculoskeletal system and connective tissue: Secondary | ICD-10-CM

## 2015-10-22 DIAGNOSIS — E669 Obesity, unspecified: Secondary | ICD-10-CM

## 2015-10-22 DIAGNOSIS — E782 Mixed hyperlipidemia: Secondary | ICD-10-CM | POA: Diagnosis not present

## 2015-10-22 DIAGNOSIS — K219 Gastro-esophageal reflux disease without esophagitis: Secondary | ICD-10-CM | POA: Diagnosis not present

## 2015-10-22 MED ORDER — OMEPRAZOLE 20 MG PO CPDR: 20.0000 mg | DELAYED_RELEASE_CAPSULE | Freq: Every day | ORAL | Status: DC

## 2015-10-22 MED FILL — OMEPRAZOLE DR 20 MG CAPSULE: 20 | 90 days supply | Qty: 90 | Fill #0

## 2015-10-22 NOTE — Assessment & Plan Note (Signed)
On clomid Will check testosterone level for him with blood work

## 2015-10-22 NOTE — Assessment & Plan Note (Signed)
Taking cherry tabs No recent gout flare

## 2015-10-22 NOTE — Assessment & Plan Note (Signed)
Has had elevated lipids Will recheck lipid panel Exercising regularly  Working on weight loss Improving diet Will continue above lifestyle changes

## 2015-10-22 NOTE — Progress Notes (Signed)
Subjective:    Patient ID: Michael Hart, male    DOB: [birth year], 30 y.o.   MRN: [id number]  HPI He is here to establish with a new pcp.    He has a lot of burping all the time.  He does have GERD 4 days out of the week.  He sometimes has discomfort in his LUQ. This is not pain, just discomfort.  There is no pattern to it.  He is tryiing to exercise and eating better.  He went on a run a couple of weeks ago and ran harder than usual and a few days later he had increased LUQ discomfort - no pain. He thinks it is getting better.  Sometimes it is tender on his left side.    He has high uric acid level and has had gout in the past.  He saw orthopedics and was diagnosed with gout.  He was on colchicine, but is on longer taking that.  He is now only taking chery pills and has not had gout.    Low testosterone:  He is taking supplementation.  He needs to have his testosterone level checked and wonders if that could be done here. He has not noticed a difference since starting the medication.   Medications and allergies reviewed with patient and updated if appropriate.  Patient Active Problem List   Diagnosis Date Noted  . Non-compliant behavior 04/10/2015  . Mixed dyslipidemia 12/05/2014  . Elevated blood pressure   . Obese     Current Outpatient Prescriptions on File Prior to Visit  Medication Sig Dispense Refill  . acetaminophen (TYLENOL) 325 MG tablet Take 650 mg by mouth as needed.    . Colchicine 0.6 MG CAPS 1 capsule every 12 hours as needed for any ankle pain or swelling 30 capsule 0  . dextromethorphan-guaiFENesin (MUCINEX DM) 30-600 MG per 12 hr tablet Take 1 tablet by mouth as needed for cough.     No current facility-administered medications on file prior to visit.    Past Medical History  Diagnosis Date  . Asthma   . Allergy   . Kidney stone   . Panic attacks     nocturnal, controls with self directed biofeedback  . Gout     Past Surgical History  Procedure  Laterality Date  . No past surgeries      Social History   Social History  . Marital Status: Married    Spouse Name: N/A  . Number of Children: N/A  . Years of Education: N/A   Social History Main Topics  . Smoking status: Former Games developermoker  . Smokeless tobacco: Former NeurosurgeonUser    Quit date: 06/23/2011  . Alcohol Use: Yes     Comment: occasional  . Drug Use: No  . Sexual Activity: Not on file   Other Topics Concern  . 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 Breast cancer Mother   . Hypertension Other   . Stroke Other     Review of Systems  Constitutional: Negative for fever, chills, appetite change and fatigue.  Respiratory: Positive for shortness of breath (with LUQ discomfort). Negative for cough and wheezing.   Cardiovascular: Negative for chest pain, palpitations and leg swelling.  Gastrointestinal: Positive for abdominal pain (LUQ intermittent). Negative for nausea, diarrhea, constipation and blood in stool.  Genella Rife 4/7 days a week  Neurological: Negative for dizziness, light-headedness and headaches.       Objective:   Filed Vitals:   10/22/15 1608  BP: 124/80  Pulse: 77  Temp: 98.3 F (36.8 C)  Resp: 16   Filed Weights   10/22/15 1608  Weight: 237 lb (107.502 kg)   Body mass index is 34.01 kg/(m^2).   Physical Exam Constitutional: He appears well-developed and well-nourished. No distress.  HENT:  Head: Normocephalic and atraumatic.  Neck: Neck supple. No tracheal deviation present. No thyromegaly present.  No carotid bruit  Cardiovascular: Normal rate, regular rhythm, normal heart sounds and intact distal pulses.   No murmur heard. Pulmonary/Chest: Effort normal and breath sounds normal. No respiratory distress. He has no wheezes. He has no rales.  Abdominal: Soft.  He exhibits no distension. There is no tenderness -  left side of abdomen is not tender - area of tenderness was left mid flank/quadrant  Musculoskeletal: He exhibits no edema.  Lymphadenopathy:   He has no cervical adenopathy.  Skin: Skin is warm and dry. He is not diaphoretic.  Psychiatric: He has a normal mood and affect. His behavior is normal.         Assessment & Plan:   Screening blood work ordered  See Problem List for Assessment and Plan of chronic medical problems.  Follow up annually, sooner if needed

## 2015-10-22 NOTE — Assessment & Plan Note (Signed)
Has made changes in his lifestyle and will continue regular exercise and more healthy diet

## 2015-10-22 NOTE — Progress Notes (Signed)
Pre visit review using our clinic review tool, if applicable. No additional management support is needed unless otherwise documented below in the visit note. 

## 2015-10-22 NOTE — Assessment & Plan Note (Signed)
New, occuring about 4 days a week This may or may not be related to his left sided abdominal pain -- which he will just monitor for now Reviewed GERD diet Work on weight loss Start omeprazole 20 mg daily - take daily for now and will try to taper off after GERD is well controlled for a while Will call or return with question or concerns

## 2015-10-22 NOTE — Patient Instructions (Signed)
  Test(s) ordered today. Your results will be released to MyChart (or called to you) after review, usually within 72hours after test completion. If any changes need to be made, you will be notified at that same time.   Medications reviewed and updated.  Changes include starting omeprazole for your heartburn.    Your prescription(s) have been submitted to your pharmacy. Please take as directed and contact our office if you believe you are having problem(s) with the medication(s).   Please followup in one year

## 2015-11-01 ENCOUNTER — Other Ambulatory Visit (INDEPENDENT_AMBULATORY_CARE_PROVIDER_SITE_OTHER): Payer: 59

## 2015-11-01 ENCOUNTER — Encounter: Payer: Self-pay | Admitting: Internal Medicine

## 2015-11-01 DIAGNOSIS — E291 Testicular hypofunction: Secondary | ICD-10-CM | POA: Diagnosis not present

## 2015-11-01 DIAGNOSIS — Z8639 Personal history of other endocrine, nutritional and metabolic disease: Secondary | ICD-10-CM

## 2015-11-01 DIAGNOSIS — E782 Mixed hyperlipidemia: Secondary | ICD-10-CM

## 2015-11-01 DIAGNOSIS — Z8739 Personal history of other diseases of the musculoskeletal system and connective tissue: Secondary | ICD-10-CM

## 2015-11-01 LAB — COMPREHENSIVE METABOLIC PANEL
ALT: 26 U/L (ref 0–53)
AST: 19 U/L (ref 0–37)
Albumin: 4.5 g/dL (ref 3.5–5.2)
Alkaline Phosphatase: 39 U/L (ref 39–117)
BUN: 12 mg/dL (ref 6–23)
CO2: 27 mEq/L (ref 19–32)
Calcium: 9.5 mg/dL (ref 8.4–10.5)
Chloride: 104 mEq/L (ref 96–112)
Creatinine, Ser: 0.96 mg/dL (ref 0.40–1.50)
GFR: 96.45 mL/min (ref >60.00)
Glucose, Bld: 98 mg/dL (ref 70–99)
Potassium: 4.3 mEq/L (ref 3.5–5.1)
Sodium: 139 mEq/L (ref 135–145)
Total Bilirubin: 0.4 mg/dL (ref 0.2–1.2)
Total Protein: 7.4 g/dL (ref 6.0–8.3)

## 2015-11-01 LAB — TSH: TSH: 2.47 u[IU]/mL (ref 0.35–4.50)

## 2015-11-01 LAB — CBC WITH DIFFERENTIAL/PLATELET
Basophils Absolute: 0 10*3/uL (ref 0.0–0.1)
Basophils Relative: 0.7 % (ref 0.0–3.0)
Eosinophils Absolute: 0.1 10*3/uL (ref 0.0–0.7)
Eosinophils Relative: 1.7 % (ref 0.0–5.0)
HCT: 47 % (ref 39.0–52.0)
Hemoglobin: 16.3 g/dL (ref 13.0–17.0)
Lymphocytes Relative: 38.3 % (ref 12.0–46.0)
Lymphs Abs: 2.5 10*3/uL (ref 0.7–4.0)
MCHC: 34.7 g/dL (ref 30.0–36.0)
MCV: 85.8 fl (ref 78.0–100.0)
Monocytes Absolute: 0.7 10*3/uL (ref 0.1–1.0)
Monocytes Relative: 10 % (ref 3.0–12.0)
Neutro Abs: 3.2 10*3/uL (ref 1.4–7.7)
Neutrophils Relative %: 49.3 % (ref 43.0–77.0)
Platelets: 266 10*3/uL (ref 150.0–400.0)
RBC: 5.48 Mil/uL (ref 4.22–5.81)
RDW: 12.6 % (ref 11.5–15.5)
WBC: 6.5 10*3/uL (ref 4.0–10.5)

## 2015-11-01 LAB — LIPID PANEL
Cholesterol: 225 mg/dL — ABNORMAL HIGH (ref 0–200)
HDL: 27.8 mg/dL — ABNORMAL LOW (ref >39.00)
NonHDL: 197.42
Total CHOL/HDL Ratio: 8
Triglycerides: 220 mg/dL — ABNORMAL HIGH (ref 0.0–149.0)
VLDL: 44 mg/dL — ABNORMAL HIGH (ref 0.0–40.0)

## 2015-11-01 LAB — URIC ACID: Uric Acid, Serum: 9.6 mg/dL — ABNORMAL HIGH (ref 4.0–7.8)

## 2015-11-01 LAB — LDL CHOLESTEROL, DIRECT: Direct LDL: 158 mg/dL

## 2015-11-02 ENCOUNTER — Encounter: Payer: Self-pay | Admitting: Internal Medicine

## 2015-11-03 LAB — TESTOSTERONE, FREE, TOTAL, SHBG
Sex Hormone Binding: 18.9 nmol/L (ref 16.5–55.9)
Testosterone, Free: 10.2 pg/mL (ref 8.7–25.1)
Testosterone: 334 ng/dL — ABNORMAL LOW (ref 348–1197)

## 2016-01-09 ENCOUNTER — Telehealth: Payer: 59 | Admitting: Physician Assistant

## 2016-01-09 DIAGNOSIS — B9789 Other viral agents as the cause of diseases classified elsewhere: Secondary | ICD-10-CM

## 2016-01-09 DIAGNOSIS — B349 Viral infection, unspecified: Secondary | ICD-10-CM

## 2016-01-09 DIAGNOSIS — J329 Chronic sinusitis, unspecified: Secondary | ICD-10-CM | POA: Diagnosis not present

## 2016-01-09 MED ORDER — AZELASTINE HCL 0.1 % NA SOLN: 2.0000 | Freq: Two times a day (BID) | NASAL | Status: DC

## 2016-01-09 MED FILL — AZELASTINE 0.1% (137 MCG) S: 0.1 | 25 days supply | Qty: 30 | Fill #0

## 2016-01-09 NOTE — Progress Notes (Signed)

## 2016-05-19 ENCOUNTER — Telehealth: Payer: 59 | Admitting: Family

## 2016-05-19 DIAGNOSIS — J069 Acute upper respiratory infection, unspecified: Secondary | ICD-10-CM

## 2016-05-19 DIAGNOSIS — R05 Cough: Secondary | ICD-10-CM | POA: Diagnosis not present

## 2016-05-19 DIAGNOSIS — R059 Cough, unspecified: Secondary | ICD-10-CM

## 2016-05-19 MED ORDER — PREDNISONE 10 MG (21) PO TBPK: 10.0000 mg | ORAL_TABLET | Freq: Every day | ORAL | 0 refills | Status: DC

## 2016-05-19 NOTE — Progress Notes (Signed)
We are sorry that you are not feeling well.  Here is how we plan to help!  Based on what you have shared with me it looks like you have upper respiratory tract inflammation that has resulted in a significant cough.  Inflammation and infection in the upper respiratory tract is commonly called bronchitis and has four common causes:  Allergies, Viral Infections, Acid Reflux and Bacterial Infections.  Allergies, viruses and acid reflux are treated by controlling symptoms or eliminating the cause. An example might be a cough caused by taking certain blood pressure medications. You stop the cough by changing the medication. Another example might be a cough caused by acid reflux. Controlling the reflux helps control the cough.  Based on your presentation I believe you most likely have A cough due to a virus.  This is called viral bronchitis and is best treated by rest, plenty of fluids and control of the cough.  You may use Ibuprofen or Tylenol as directed to help your symptoms.     In addition you may use A non-prescription cough medication called Mucinex DM: take 2 tablets every 12 hours.  Sterapred 10 mg dosepak  USE OF BRONCHODILATOR ("RESCUE") INHALERS: There is a risk from using your bronchodilator too frequently.  The risk is that over-reliance on a medication which only relaxes the muscles surrounding the breathing tubes can reduce the effectiveness of medications prescribed to reduce swelling and congestion of the tubes themselves.  Although you feel brief relief from the bronchodilator inhaler, your asthma may actually be worsening with the tubes becoming more swollen and filled with mucus.  This can delay other crucial treatments, such as oral steroid medications. If you need to use a bronchodilator inhaler daily, several times per day, you should discuss this with your provider.  There are probably better treatments that could be used to keep your asthma under control.     HOME CARE . Only take  medications as instructed by your medical team. . Complete the entire course of an antibiotic. . Drink plenty of fluids and get plenty of rest. . Avoid close contacts especially the very young and the elderly . Cover your mouth if you cough or cough into your sleeve. . Always remember to wash your hands . A steam or ultrasonic humidifier can help congestion.   GET HELP RIGHT AWAY IF: . You develop worsening fever. . You become short of breath . You cough up blood. . Your symptoms persist after you have completed your treatment plan MAKE SURE YOU   Understand these instructions.  Will watch your condition.  Will get help right away if you are not doing well or get worse.  Your e-visit answers were reviewed by a board certified advanced clinical practitioner to complete your personal care plan.  Depending on the condition, your plan could have included both over the counter or prescription medications. If there is a problem please reply  once you have received a response from your provider. Your safety is important to us.  If you have drug allergies check your prescription carefully.    You can use MyChart to ask questions about today's visit, request a non-urgent call back, or ask for a work or school excuse for 24 hours related to this e-Visit. If it has been greater than 24 hours you will need to follow up with your provider, or enter a new e-Visit to address those concerns. You will get an e-mail in the next two days asking about your   experience.  I hope that your e-visit has been valuable and will speed your recovery. Thank you for using e-visits.  

## 2016-07-14 DIAGNOSIS — H5213 Myopia, bilateral: Secondary | ICD-10-CM | POA: Diagnosis not present

## 2016-07-14 DIAGNOSIS — H52223 Regular astigmatism, bilateral: Secondary | ICD-10-CM | POA: Diagnosis not present

## 2016-07-14 DIAGNOSIS — H21233 Degeneration of iris (pigmentary), bilateral: Secondary | ICD-10-CM | POA: Diagnosis not present

## 2016-09-08 ENCOUNTER — Telehealth: Payer: 59 | Admitting: Nurse Practitioner

## 2016-09-08 DIAGNOSIS — M1000 Idiopathic gout, unspecified site: Secondary | ICD-10-CM

## 2016-09-08 NOTE — Progress Notes (Signed)
Based on what you shared with me it looks like you have a serious condition that should be evaluated in a face to face office visit.  NOTE: Even if you have entered your credit card information for this eVisit, you will not be charged.   If you are having a true medical emergency please call 911.  If you need an urgent face to face visit, [facility] has four urgent care centers for your convenience.  If you need care fast and have a high deductible or no insurance consider:   [email or web address]  [phone]  [address], [address] [place], [place] [address] 8 am to 8 pm Monday-Friday 10 am to 4 pm Saturday-Sunday   The following sites will take your  insurance:    . [facility]  [phone] Get Driving Directions Find a Provider at this Location  [address] [place], [place] [address] . 10 am to 8 pm Monday-Friday . 12 pm to 8 pm Saturday-Sunday   . [facility]  [phone] Get Driving Directions Find a Provider at this Location  [address], [address] [place], [place] [address] . 8 am to 8 pm Monday-Friday . 9 am to 6 pm Saturday . 11 am to 6 pm Sunday   .  [facility] [place]  [phone] Get Driving Directions  [address] [place], [place] [address] . 8 am to 8 pm Monday-Friday . 8 am to 4 pm Saturday-Sunday   Your e-visit answers were reviewed by a board certified advanced clinical practitioner to complete your personal care plan.  Thank you for using e-Visits.  

## 2017-04-03 NOTE — Assessment & Plan Note (Signed)
No longer on clomid Made lifestyle changes - diet and exercise

## 2017-04-03 NOTE — Progress Notes (Signed)
Subjective:    Patient ID: Michael Hart, male    DOB: 04-02-1986, 31 y.o.   MRN: [id number]  HPI He is here for a physical exam.   It takes him a long time to empty his bladder.  It started over three years ago.  It is less after exercise.  It occurs during the day.  It is not related to how much he drinks.  He has occasional blood in the urine - once every 6 months.  His father has had kidney stones and he thought it was related to that. He has never been diagnosed with a kidney stone, but he thinks he passed one once.  He has 2/10 pain in the bladder about once week.    Medications and allergies reviewed with patient and updated if appropriate.  Patient Active Problem List   Diagnosis Date Noted  . History of gout 10/22/2015  . Low testosterone 10/22/2015  . Esophageal reflux 10/22/2015  . Mixed dyslipidemia 12/05/2014  . Obese     Current Outpatient Prescriptions on File Prior to Visit  Medication Sig Dispense Refill  . omeprazole (PRILOSEC) 20 MG capsule Take 1 capsule (20 mg total) by mouth daily. 90 capsule 3   No current facility-administered medications on file prior to visit.     Past Medical History:  Diagnosis Date  . Allergy   . Asthma   . Gout   . Kidney stone   . Panic attacks    nocturnal, controls with self directed biofeedback    Past Surgical History:  Procedure Laterality Date  . NO PAST SURGERIES      Social History   Social History  . Marital status: Married    Spouse name: N/A  . Number of children: N/A  . Years of education: N/A   Social History Main Topics  . Smoking status: Former Games developer  . Smokeless tobacco: Former Neurosurgeon    Quit date: 06/23/2011  . Alcohol use Yes     Comment: occasional  . Drug use: No  . Sexual activity: Not Asked   Other Topics Concern  . None   Social History Narrative   Married, lives with spouse   Engineer, maintenance (IT), works in Furniture conservator/restorer    Family History  Problem Relation Age of Onset  .  Breast cancer Mother   . Lung cancer Father   . Heart disease Father        CABG  . Hypertension Other   . Stroke Other     Review of Systems  Constitutional: Negative for chills and fever.  Eyes: Negative for visual disturbance.  Respiratory: Negative for cough, shortness of breath and wheezing.   Cardiovascular: Negative for chest pain, palpitations and leg swelling.  Gastrointestinal: Negative for abdominal pain, blood in stool, constipation, diarrhea and nausea.  Genitourinary: Positive for difficulty urinating, dysuria and hematuria (intermittent). Negative for frequency.  Musculoskeletal: Positive for arthralgias (intermittent - gout flares).  Skin: Negative for color change and rash.  Neurological: Negative for light-headedness and headaches.  Psychiatric/Behavioral: Negative for dysphoric mood. The patient is nervous/anxious.        Objective:   Vitals:   04/05/17 1422  BP: 130/86  Pulse: 76  Resp: 16  Temp: 98.7 F (37.1 C)  SpO2: 98%   Filed Weights   04/05/17 1422  Weight: 229 lb (103.9 kg)   Body mass index is 32.86 kg/m.  Wt Readings from Last 3 Encounters:  04/05/17 229 lb (103.9 kg)  10/22/15 237 lb (107.5 kg)  12/05/14 235 lb (106.6 kg)     Physical Exam Constitutional: He appears well-developed and well-nourished. No distress.  HENT:  Head: Normocephalic and atraumatic.  Right Ear: External ear normal.  Left Ear: External ear normal.  Mouth/Throat: Oropharynx is clear and moist.  Normal ear canals and TM b/l  Eyes: Conjunctivae and EOM are normal.  Neck: Neck supple. No tracheal deviation present. No thyromegaly present.  No carotid bruit  Cardiovascular: Normal rate, regular rhythm, normal heart sounds and intact distal pulses.   No murmur heard. Pulmonary/Chest: Effort normal and breath sounds normal. No respiratory distress. He has no wheezes. He has no rales.  Abdominal: Soft. He exhibits no distension. There is no tenderness.    Genitourinary: deferred  Musculoskeletal: He exhibits no edema.  Lymphadenopathy:   He has no cervical adenopathy.  Skin: Skin is warm and dry. He is not diaphoretic.  Psychiatric: He has a normal mood and affect. His behavior is normal.         Assessment & Plan:   Physical exam: Screening blood work  ordered Immunizations  Flu today, td up to date Exercise  regularly Weight - working on weight loss Skin    No concerns Substance abuse   none  See Problem List for Assessment and Plan of chronic medical problems.

## 2017-04-03 NOTE — Patient Instructions (Addendum)
Test(s) ordered today. Your results will be released to MyChart (or called to you) after review, usually within 72hours after test completion. If any changes need to be made, you will be notified at that same time.  All other Health Maintenance issues reviewed.   All recommended immunizations and age-appropriate screenings are up-to-date or discussed.  No immunizations administered today.   Medications reviewed and updated.  No changes recommended at this time.  A ct scan was ordered.   A referral was ordered for urology  Please followup in one year    Health Maintenance, Male A healthy lifestyle and preventive care is important for your health and wellness. Ask your health care provider about what schedule of regular examinations is right for you. What should I know about weight and diet? Eat a Healthy Diet  Eat plenty of vegetables, fruits, whole grains, low-fat dairy products, and lean protein.  Do not eat a lot of foods high in solid fats, added sugars, or salt.  Maintain a Healthy Weight Regular exercise can help you achieve or maintain a healthy weight. You should:  Do at least 150 minutes of exercise each week. The exercise should increase your heart rate and make you sweat (moderate-intensity exercise).  Do strength-training exercises at least twice a week.  Watch Your Levels of Cholesterol and Blood Lipids  Have your blood tested for lipids and cholesterol every 5 years starting at 33 years of age. If you are at high risk for heart disease, you should start having your blood tested when you are 32 years old. You may need to have your cholesterol levels checked more often if: ? Your lipid or cholesterol levels are high. ? You are older than 33 years of age. ? You are at high risk for heart disease.  What should I know about cancer screening? Many types of cancers can be detected early and may often be prevented. Lung Cancer  You should be screened every year for  lung cancer if: ? You are a current smoker who has smoked for at least 30 years. ? You are a former smoker who has quit within the past 15 years.  Talk to your health care provider about your screening options, when you should start screening, and how often you should be screened.  Colorectal Cancer  Routine colorectal cancer screening usually begins at 33 years of age and should be repeated every 5-10 years until you are 33 years old. You may need to be screened more often if early forms of precancerous polyps or small growths are found. Your health care provider may recommend screening at an earlier age if you have risk factors for colon cancer.  Your health care provider may recommend using home test kits to check for hidden blood in the stool.  A small camera at the end of a tube can be used to examine your colon (sigmoidoscopy or colonoscopy). This checks for the earliest forms of colorectal cancer.  Prostate and Testicular Cancer  Depending on your age and overall health, your health care provider may do certain tests to screen for prostate and testicular cancer.  Talk to your health care provider about any symptoms or concerns you have about testicular or prostate cancer.  Skin Cancer  Check your skin from head to toe regularly.  Tell your health care provider about any new moles or changes in moles, especially if: ? There is a change in a mole's size, shape, or color. ? You have a mole that  is larger than a pencil eraser.  Always use sunscreen. Apply sunscreen liberally and repeat throughout the day.  Protect yourself by wearing long sleeves, pants, a wide-brimmed hat, and sunglasses when outside.  What should I know about heart disease, diabetes, and high blood pressure?  If you are 5-54 years of age, have your blood pressure checked every 3-5 years. If you are 43 years of age or older, have your blood pressure checked every year. You should have your blood pressure  measured twice-once when you are at a hospital or clinic, and once when you are not at a hospital or clinic. Record the average of the two measurements. To check your blood pressure when you are not at a hospital or clinic, you can use: ? An automated blood pressure machine at a pharmacy. ? A home blood pressure monitor.  Talk to your health care provider about your target blood pressure.  If you are between 28-34 years old, ask your health care provider if you should take aspirin to prevent heart disease.  Have regular diabetes screenings by checking your fasting blood sugar level. ? If you are at a normal weight and have a low risk for diabetes, have this test once every three years after the age of 59. ? If you are overweight and have a high risk for diabetes, consider being tested at a younger age or more often.  A one-time screening for abdominal aortic aneurysm (AAA) by ultrasound is recommended for men aged 10-75 years who are current or former smokers. What should I know about preventing infection? Hepatitis B If you have a higher risk for hepatitis B, you should be screened for this virus. Talk with your health care provider to find out if you are at risk for hepatitis B infection. Hepatitis C Blood testing is recommended for:  Everyone born from [birth year] through 1967.  Anyone with known risk factors for hepatitis C.  Sexually Transmitted Diseases (STDs)  You should be screened each year for STDs including gonorrhea and chlamydia if: ? You are sexually active and are younger than 33 years of age. ? You are older than 32 years of age and your health care provider tells you that you are at risk for this type of infection. ? Your sexual activity has changed since you were last screened and you are at an increased risk for chlamydia or gonorrhea. Ask your health care provider if you are at risk.  Talk with your health care provider about whether you are at high risk of being infected  with HIV. Your health care provider may recommend a prescription medicine to help prevent HIV infection.  What else can I do?  Schedule regular health, dental, and eye exams.  Stay current with your vaccines (immunizations).  Do not use any tobacco products, such as cigarettes, chewing tobacco, and e-cigarettes. If you need help quitting, ask your health care provider.  Limit alcohol intake to no more than 2 drinks per day. One drink equals 12 ounces of beer, 5 ounces of wine, or 1 ounces of hard liquor.  Do not use street drugs.  Do not share needles.  Ask your health care provider for help if you need support or information about quitting drugs.  Tell your health care provider if you often feel depressed.  Tell your health care provider if you have ever been abused or do not feel safe at home. This information is not intended to replace advice given to you by your health  care provider. Make sure you discuss any questions you have with your health care provider. Document Released: 12/05/2007 Document Revised: 02/05/2016 Document Reviewed: 03/12/2015 Elsevier Interactive Patient Education  [employer].

## 2017-04-05 ENCOUNTER — Ambulatory Visit (INDEPENDENT_AMBULATORY_CARE_PROVIDER_SITE_OTHER): Payer: 59 | Admitting: Internal Medicine

## 2017-04-05 ENCOUNTER — Encounter: Payer: Self-pay | Admitting: Internal Medicine

## 2017-04-05 ENCOUNTER — Other Ambulatory Visit (INDEPENDENT_AMBULATORY_CARE_PROVIDER_SITE_OTHER): Payer: 59

## 2017-04-05 VITALS — BP 130/86 | HR 76 | Temp 98.7°F | Resp 16 | Ht 70.0 in | Wt 229.0 lb

## 2017-04-05 DIAGNOSIS — Z8739 Personal history of other diseases of the musculoskeletal system and connective tissue: Secondary | ICD-10-CM | POA: Diagnosis not present

## 2017-04-05 DIAGNOSIS — Z0001 Encounter for general adult medical examination with abnormal findings: Secondary | ICD-10-CM

## 2017-04-05 DIAGNOSIS — Z114 Encounter for screening for human immunodeficiency virus [HIV]: Secondary | ICD-10-CM

## 2017-04-05 DIAGNOSIS — Z0000 Encounter for general adult medical examination without abnormal findings: Secondary | ICD-10-CM

## 2017-04-05 DIAGNOSIS — K219 Gastro-esophageal reflux disease without esophagitis: Secondary | ICD-10-CM

## 2017-04-05 DIAGNOSIS — E782 Mixed hyperlipidemia: Secondary | ICD-10-CM | POA: Diagnosis not present

## 2017-04-05 DIAGNOSIS — R310 Gross hematuria: Secondary | ICD-10-CM | POA: Diagnosis not present

## 2017-04-05 DIAGNOSIS — R319 Hematuria, unspecified: Secondary | ICD-10-CM | POA: Insufficient documentation

## 2017-04-05 LAB — URINALYSIS, ROUTINE W REFLEX MICROSCOPIC
Bilirubin Urine: NEGATIVE
Hgb urine dipstick: NEGATIVE
Ketones, ur: NEGATIVE
Leukocytes, UA: NEGATIVE
Nitrite: NEGATIVE
RBC / HPF: NONE SEEN (ref 0–?)
Specific Gravity, Urine: 1.025 (ref 1.000–1.030)
Total Protein, Urine: NEGATIVE
Urine Glucose: NEGATIVE
Urobilinogen, UA: 0.2 (ref 0.0–1.0)
pH: 6 (ref 5.0–8.0)

## 2017-04-05 LAB — COMPREHENSIVE METABOLIC PANEL
ALT: 26 U/L (ref 0–53)
AST: 17 U/L (ref 0–37)
Albumin: 4.8 g/dL (ref 3.5–5.2)
Alkaline Phosphatase: 47 U/L (ref 39–117)
BUN: 12 mg/dL (ref 6–23)
CO2: 28 mEq/L (ref 19–32)
Calcium: 9.7 mg/dL (ref 8.4–10.5)
Chloride: 102 mEq/L (ref 96–112)
Creatinine, Ser: 0.94 mg/dL (ref 0.40–1.50)
GFR: 97.95 mL/min (ref >60.00)
Glucose, Bld: 88 mg/dL (ref 70–99)
Potassium: 3.8 mEq/L (ref 3.5–5.1)
Sodium: 139 mEq/L (ref 135–145)
Total Bilirubin: 0.8 mg/dL (ref 0.2–1.2)
Total Protein: 7.7 g/dL (ref 6.0–8.3)

## 2017-04-05 LAB — CBC WITH DIFFERENTIAL/PLATELET
Basophils Absolute: 0.1 10*3/uL (ref 0.0–0.1)
Basophils Relative: 0.9 % (ref 0.0–3.0)
Eosinophils Absolute: 0.1 10*3/uL (ref 0.0–0.7)
Eosinophils Relative: 1.9 % (ref 0.0–5.0)
HCT: 43.9 % (ref 39.0–52.0)
Hemoglobin: 15.5 g/dL (ref 13.0–17.0)
Lymphocytes Relative: 35.1 % (ref 12.0–46.0)
Lymphs Abs: 2.3 10*3/uL (ref 0.7–4.0)
MCHC: 35.3 g/dL (ref 30.0–36.0)
MCV: 86.2 fl (ref 78.0–100.0)
Monocytes Absolute: 0.6 10*3/uL (ref 0.1–1.0)
Monocytes Relative: 9.5 % (ref 3.0–12.0)
Neutro Abs: 3.4 10*3/uL (ref 1.4–7.7)
Neutrophils Relative %: 52.6 % (ref 43.0–77.0)
Platelets: 308 10*3/uL (ref 150.0–400.0)
RBC: 5.1 Mil/uL (ref 4.22–5.81)
RDW: 12.7 % (ref 11.5–15.5)
WBC: 6.5 10*3/uL (ref 4.0–10.5)

## 2017-04-05 LAB — HEMOGLOBIN A1C: Hgb A1c MFr Bld: 5.1 % (ref 4.6–6.5)

## 2017-04-05 LAB — LIPID PANEL
Cholesterol: 192 mg/dL (ref 0–200)
HDL: 30 mg/dL — ABNORMAL LOW (ref >39.00)
LDL Cholesterol: 123 mg/dL — ABNORMAL HIGH (ref 0–99)
NonHDL: 161.89
Total CHOL/HDL Ratio: 6
Triglycerides: 195 mg/dL — ABNORMAL HIGH (ref 0.0–149.0)
VLDL: 39 mg/dL (ref 0.0–40.0)

## 2017-04-05 LAB — TSH: TSH: 1.35 u[IU]/mL (ref 0.35–4.50)

## 2017-04-05 LAB — URIC ACID: Uric Acid, Serum: 9.7 mg/dL — ABNORMAL HIGH (ref 4.0–7.8)

## 2017-04-05 MED ORDER — ALBUTEROL SULFATE HFA 108 (90 BASE) MCG/ACT IN AERS: 2.0000 | INHALATION_SPRAY | Freq: Four times a day (QID) | RESPIRATORY_TRACT | 0 refills | Status: DC | PRN

## 2017-04-05 NOTE — Assessment & Plan Note (Signed)
Lipids, cmp

## 2017-04-05 NOTE — Assessment & Plan Note (Signed)
Occasionally has gout symptoms Trying to control it with diet Check uric acid Discussed allopurinol - if he keeps having symptoms he should start it

## 2017-04-05 NOTE — Assessment & Plan Note (Signed)
Has had a few episodes - possible kidney stones Also has difficulty urinating Will refer to urology UA, UCx CT renal protocol

## 2017-04-05 NOTE — Assessment & Plan Note (Signed)
Taking less of the omeprazole - uses it as needed continue

## 2017-04-06 LAB — HIV ANTIBODY (ROUTINE TESTING W REFLEX): HIV 1&2 Ab, 4th Generation: NONREACTIVE

## 2017-04-07 LAB — URINE CULTURE
MICRO NUMBER:: 81146497
SPECIMEN QUALITY:: ADEQUATE

## 2017-04-08 ENCOUNTER — Encounter: Payer: Self-pay | Admitting: Internal Medicine

## 2017-04-12 ENCOUNTER — Encounter: Payer: Self-pay | Admitting: Internal Medicine

## 2017-04-12 ENCOUNTER — Ambulatory Visit (INDEPENDENT_AMBULATORY_CARE_PROVIDER_SITE_OTHER)
Admission: RE | Admit: 2017-04-12 | Discharge: 2017-04-12 | Disposition: A | Discharge status: Home / Self Care | Payer: 59 | Source: Ambulatory Visit | Attending: Internal Medicine | Admitting: Internal Medicine

## 2017-04-12 DIAGNOSIS — R310 Gross hematuria: Secondary | ICD-10-CM | POA: Diagnosis not present

## 2017-04-12 DIAGNOSIS — N200 Calculus of kidney: Secondary | ICD-10-CM | POA: Diagnosis not present

## 2017-04-22 DIAGNOSIS — R3912 Poor urinary stream: Secondary | ICD-10-CM | POA: Diagnosis not present

## 2017-04-22 DIAGNOSIS — R310 Gross hematuria: Secondary | ICD-10-CM | POA: Diagnosis not present

## 2017-05-04 DIAGNOSIS — M62838 Other muscle spasm: Secondary | ICD-10-CM | POA: Diagnosis not present

## 2017-05-04 DIAGNOSIS — R3912 Poor urinary stream: Secondary | ICD-10-CM | POA: Diagnosis not present

## 2017-05-04 DIAGNOSIS — M6281 Muscle weakness (generalized): Secondary | ICD-10-CM | POA: Diagnosis not present

## 2017-05-04 DIAGNOSIS — M6289 Other specified disorders of muscle: Secondary | ICD-10-CM | POA: Diagnosis not present

## 2017-05-20 DIAGNOSIS — M62838 Other muscle spasm: Secondary | ICD-10-CM | POA: Diagnosis not present

## 2017-05-20 DIAGNOSIS — M6281 Muscle weakness (generalized): Secondary | ICD-10-CM | POA: Diagnosis not present

## 2017-05-20 DIAGNOSIS — M6289 Other specified disorders of muscle: Secondary | ICD-10-CM | POA: Diagnosis not present

## 2017-05-20 DIAGNOSIS — R3912 Poor urinary stream: Secondary | ICD-10-CM | POA: Diagnosis not present

## 2017-06-09 DIAGNOSIS — M62838 Other muscle spasm: Secondary | ICD-10-CM | POA: Diagnosis not present

## 2017-06-09 DIAGNOSIS — R3912 Poor urinary stream: Secondary | ICD-10-CM | POA: Diagnosis not present

## 2017-06-09 DIAGNOSIS — R102 Pelvic and perineal pain: Secondary | ICD-10-CM | POA: Diagnosis not present

## 2017-06-09 DIAGNOSIS — M6289 Other specified disorders of muscle: Secondary | ICD-10-CM | POA: Diagnosis not present

## 2017-06-09 DIAGNOSIS — M6281 Muscle weakness (generalized): Secondary | ICD-10-CM | POA: Diagnosis not present

## 2017-06-30 DIAGNOSIS — N35011 Post-traumatic bulbous urethral stricture: Secondary | ICD-10-CM | POA: Diagnosis not present

## 2017-07-21 ENCOUNTER — Ambulatory Visit: Payer: 59 | Admitting: Internal Medicine

## 2017-07-21 NOTE — Progress Notes (Signed)
Subjective:    Patient ID: Michael Hart, male    DOB: [birth year], 32 y.o.   MRN: [id number]  HPI He is here for an acute visit.   Right ankle injury: [place] and was transitioning from the road to the sidewalk and stepped in a hole.  He inverted his right ankle.  He still had 2 miles to run and he continued to run.  He has had lateral and dorsal right ankle/foot pain and swelling.  He denies any numbness or tingling.  Overall his foot pain is better-approximately 80% better.  He is still having a feeling of instability in the ankle and pain at an intensity of 3-4/10.  The pain is located at the lateral malleolus and dorsal aspect of the foot.  He denies any numbness or tingling.  He continues to exercise regularly-currently doing a boot camp.  He wants to continue to be able to exercise regularly.     Medications and allergies reviewed with patient and updated if appropriate.  Patient Active Problem List   Diagnosis Date Noted  . Hematuria 04/05/2017  . History of gout 10/22/2015  . Low testosterone 10/22/2015  . Esophageal reflux 10/22/2015  . Mixed dyslipidemia 12/05/2014  . Obese     Current Outpatient Medications on File Prior to Visit  Medication Sig Dispense Refill  . albuterol (PROVENTIL HFA;VENTOLIN HFA) 108 (90 Base) MCG/ACT inhaler Inhale 2 puffs into the lungs every 6 (six) hours as needed for wheezing or shortness of breath. 1 Inhaler 0  . omeprazole (PRILOSEC) 20 MG capsule Take 1 capsule (20 mg total) by mouth daily. 90 capsule 3   No current facility-administered medications on file prior to visit.     Past Medical History:  Diagnosis Date  . Allergy   . Asthma   . Gout   . Kidney stone   . Panic attacks    nocturnal, controls with self directed biofeedback    Past Surgical History:  Procedure Laterality Date  . NO PAST SURGERIES      Social History   Socioeconomic History  . Marital status: Married    Spouse name: None    . Number of children: None  . Years of education: None  . Highest education level: None  Social Needs  . Financial resource strain: None  . Food insecurity - worry: None  . Food insecurity - inability: None  . Transportation needs - medical: None  . Transportation needs - non-medical: None  Occupational History  . None  Tobacco Use  . Smoking status: Former Games developermoker  . Smokeless tobacco: Former NeurosurgeonUser    Quit date: 06/23/2011  Substance and Sexual Activity  . Alcohol use: Yes    Comment: occasional  . Drug use: No  . Sexual activity: None  Other Topics Concern  . 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 Breast cancer Mother   . Lung cancer Father   . Heart disease Father        CABG  . Hypertension Other   . Stroke Other     Review of Systems  Constitutional: Negative for fever.  Cardiovascular: Negative for leg swelling.  Musculoskeletal: Negative for joint swelling.       Mild foot swelling intermittently, right foot and ankle pain  Skin: Negative for color change and wound.  Objective:   Vitals:   07/22/17 0959  BP: 136/86  Pulse: 76  Resp: 16  Temp: 98 F (36.7 C)  SpO2: 97%   Wt Readings from Last 3 Encounters:  07/22/17 231 lb (104.8 kg)  04/05/17 229 lb (103.9 kg)  10/22/15 237 lb (107.5 kg)   Body mass index is 33.15 kg/m.   Physical Exam  Constitutional: He appears well-developed and well-nourished. No distress.  HENT:  Head: Normocephalic and atraumatic.  Cardiovascular: Intact distal pulses.  Musculoskeletal:  No right foot swelling or deformity, mild tenderness with palpation lateral/posterior malleolus   Neurological:  Normal sensation right foot  Skin: Skin is warm and dry. He is not diaphoretic. No erythema.           Assessment & Plan:    See Problem List for Assessment and Plan of chronic medical  problems.

## 2017-07-22 ENCOUNTER — Ambulatory Visit: Payer: 59 | Admitting: Internal Medicine

## 2017-07-22 ENCOUNTER — Encounter: Payer: Self-pay | Admitting: Internal Medicine

## 2017-07-22 VITALS — BP 136/86 | HR 76 | Temp 98.0°F | Resp 16 | Wt 231.0 lb

## 2017-07-22 DIAGNOSIS — S96911A Strain of unspecified muscle and tendon at ankle and foot level, right foot, initial encounter: Secondary | ICD-10-CM | POA: Diagnosis not present

## 2017-07-22 NOTE — Assessment & Plan Note (Signed)
Sprained R ankle - 80% - still with some residual pain and instability Exercises provided Referral to sports medicine

## 2017-07-22 NOTE — Patient Instructions (Signed)
Set up an appointment with sports medicine.    Chronic Ankle Instability Rehab After Surgery Ask your health care provider which exercises are safe for you. Do exercises exactly as told by your health care provider and adjust them as directed. It is normal to feel mild stretching, pulling, tightness, or discomfort as you do these exercises, but you should stop right away if you feel sudden pain or your pain gets worse.Do not begin these exercises until told by your health care provider. Stretching and range of motion exercises These exercises warm up your muscles and joints and improve the movement and flexibility of your ankle. These exercises also help to relieve pain, numbness, and tingling. Exercise A: Dorsiflexion/plantar flexion  1. Sit with your left / right knee straight or bent. Do not rest your foot on anything. 2. Flex your left / right ankle to tilt the top of your foot toward your shin. 3. Hold this position for __________ seconds. 4. Point your toes downward to tilt the top of your foot away from your shin. 5. Hold this position for __________ seconds. Repeat __________ times with your knee straight, then __________ times with your knee bent. Complete this exercise __________ times a day. Exercise B: Ankle alphabet  1. Sit with your left / right leg supported at the lower leg. ? Do not rest your foot on anything. ? Make sure your foot has room to move freely. 2. Think of your left / right foot as a paintbrush, and move your foot to trace each letter of the alphabet in the air. Keep your hip and knee still while you trace. Make the letters as large as you can without feeling discomfort. 3. Trace every letter from A to Z. Repeat __________ times. Complete this exercise __________ times a day. Exercise C: Gastrocsoleus  1. Sit on the floor with your left / right leg extended. 2. Loop a belt or towel around the ball of your left / right foot. The ball of your foot is on the  walking surface, right under your toes. 3. Keep your left / right ankle and foot relaxed and keep your knee straight while you use the belt or towel to pull your foot and ankle toward you. You should feel a gentle stretch behind your calf or knee. 4. Hold this position for __________ seconds. Repeat __________ times. Complete this exercise __________ times a day. Exercise D: Ankle dorsiflexion, active-assisted  1. Sit on a chair that is placed on a non-carpeted surface. 2. Place your left / right foot on the floor, directly under your knee. Extend your other leg for support. 3. Keeping your heel down, slide your left / right foot back toward the chair until you feel a stretch at your ankle or calf. If you do not feel a stretch, slide your buttocks forward to the edge of the chair. 4. Hold this stretch for __________ seconds. Repeat __________ times. Complete this stretch __________ times a day. Strengthening exercises These exercises build strength and endurance in your ankle. Endurance is the ability to use your muscles for a long time, even after they get tired. Exercise E: Dorsiflexion with band  1. Secure a rubber exercise band or tube to an object, like a table leg, that will not move if it is pulled on. Secure the other end around your left / right foot. 2. Sit on the floor facing the object with your left / right foot extended. The band or tube should be slightly tense  when your foot is relaxed. 3. Slowly flex your left / right ankle and toes to bring your foot toward you. 4. Hold this position for __________ seconds. 5. Let the band or tube slowly pull your foot back to the starting position. Repeat __________ times. Complete this exercise times a day. Exercise F: Plantar flexion with band  1. Sit on the floor with your left / right leg extended. 2. Loop a rubber exercise band or tube around the ball of your left / right foot. The ball of your foot is on the walking surface, right  under your toes. The band or tube should be slightly tense when your foot is relaxed. 3. Slowly point your toes downward, pushing them away from you. 4. Hold this position for __________ seconds. 5. Let the band or tube slowly pull your foot back to the starting position. Repeat __________ times. Complete this exercise __________ times a day. Exercise G: Ankle eversion with band  1. Secure one end of an exercise band or tubing to a fixed object, such as a table leg or a pole, that will stay still when the band is pulled. 2. Loop the other end of the band around the middle of your left / right foot. 3. Sit on the floor, facing the fixed object. The band should be slightly tense when your foot is relaxed. 4. Make fists with your hands and put them between your knees. This will focus your strengthening at your ankle. 5. Leading with your little toe, slowly push your banded foot outward, away from your body. The band or tube should be adding resistance. 6. Hold this position for __________ seconds. 7. Slowly return your foot to the starting position while controlling the tension in the band. Repeat __________ times. Complete this exercise __________ times a day. Exercise H: Ankle inversion with band  1. Secure one end of an exercise band or tubing to a fixed object, such as a table leg or a pole, that will stay still when the band is pulled. 2. Loop the other end of the band around your left / right foot, near your toes. 3. Sit on the floor, facing the fixed object. The band should be slightly tense when your foot is relaxed. 4. Make fists with your hands and put them between your knees. This will focus your strengthening at your ankle. 5. Leading with your big toe, slowly pull your banded foot inward, toward your body. The band or tube should be adding resistance. 6. Hold this position for __________ seconds. 7. Let the band or tube slowly pull your foot back to the starting position. Repeat  __________ times. Complete this exercises __________ times a day. Exercise I: Towel curls  1. Sit in a chair on a non-carpeted surface, and put your feet on the floor. 2. Place a towel in front of your feet. If told by your health care provider, add __________ to the end of the towel. 3. Keeping your heel on the floor, put your left / right footon the towel. 4. Pull the towel toward you by grabbing the towel with your toes and curling them under. Keep your heel on the floor. Repeat __________ times. Complete this exercise __________ times a day. This information is not intended to replace advice given to you by your health care provider. Make sure you discuss any questions you have with your health care provider. Document Released: 09/30/2015 Document Revised: 02/13/2016 Document Reviewed: 06/22/2015 Elsevier Interactive Patient Education  2018 Elsevier  Inc.  

## 2017-07-29 ENCOUNTER — Encounter: Payer: Self-pay | Admitting: Family Medicine

## 2017-07-29 ENCOUNTER — Ambulatory Visit: Payer: 59 | Admitting: Family Medicine

## 2017-07-29 ENCOUNTER — Ambulatory Visit: Payer: Self-pay

## 2017-07-29 VITALS — BP 132/78 | HR 81 | Ht 70.0 in | Wt 234.0 lb

## 2017-07-29 DIAGNOSIS — M24471 Recurrent dislocation, right ankle: Secondary | ICD-10-CM | POA: Diagnosis not present

## 2017-07-29 DIAGNOSIS — G8929 Other chronic pain: Secondary | ICD-10-CM

## 2017-07-29 DIAGNOSIS — M25571 Pain in right ankle and joints of right foot: Principal | ICD-10-CM

## 2017-07-29 MED ORDER — ALLOPURINOL 100 MG PO TABS: 200.0000 mg | ORAL_TABLET | Freq: Every day | ORAL | 6 refills | Status: DC

## 2017-07-29 MED FILL — ALLOPURINOL 100 MG TABS: 100 | 30 days supply | Qty: 60 | Fill #0 | Status: TO

## 2017-07-29 NOTE — Assessment & Plan Note (Signed)
Home exercises given, topical anti-inflammatories given, patient did have what appears to be significant gout and started on allopurinol.  Discussed which activities of doing which wants to avoid.  Follow-up again 4 weeks

## 2017-07-29 NOTE — Progress Notes (Signed)
Michael ScaleZach Kashonda Hart D.O. [facility] [address] [place], [place] [address] Phone: [phone] Subjective:    I'm seeing this patient by the request  of:  Pincus SanesBurns, Stacy J, MD   CC: right ankle pain   [id number]:Subjective  Michael AmsterdamWilliam H Hart is a 32 y.o. male coming in with complaint of right ankle and medial foot pain. States he stepped in a hole during a marathon. Says his ankle doesn't feel stable. Says his foot likes to invert with activity. Says he thought he had gout and that he's been monitoring his uric acid. Has been taking tart cherry.   Onset- December Location- Achilles/ peroneals and big toe Character- Sharp Aggravating factors- Running (heel striker), the step back of a step up, planting his foot a certain way Reliving factors- [place]  Severity-5 out of 10     Past Medical History:  Diagnosis Date  . Allergy   . Asthma   . Gout   . Kidney stone   . Panic attacks    nocturnal, controls with self directed biofeedback   Past Surgical History:  Procedure Laterality Date  . NO PAST SURGERIES     Social History   Socioeconomic History  . Marital status: Married    Spouse name: None  . Number of children: None  . Years of education: None  . Highest education level: None  Social Needs  . Financial resource strain: None  . Food insecurity - worry: None  . Food insecurity - inability: None  . Transportation needs - medical: None  . Transportation needs - non-medical: None  Occupational History  . None  Tobacco Use  . Smoking status: Former Games developermoker  . Smokeless tobacco: Former NeurosurgeonUser    Quit date: 06/23/2011  Substance and Sexual Activity  . Alcohol use: Yes    Comment: occasional  . Drug use: No  . Sexual activity: None  Other Topics Concern  . 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 Penicillins   . Sulfa Antibiotics    Family History  Problem  Relation Age of Onset  . Breast cancer Mother   . Lung cancer Father   . Heart disease Father        CABG  . Hypertension Other   . Stroke Other      Past medical history, social, surgical and family history all reviewed in electronic medical record.  No pertanent information unless stated regarding to the chief complaint.   Review of Systems:Review of systems updated and as accurate as of 07/29/17  No headache, visual changes, nausea, vomiting, diarrhea, constipation, dizziness, abdominal pain, skin rash, fevers, chills, night sweats, weight loss, swollen lymph nodes, body aches, joint swelling, muscle aches, chest pain, shortness of breath, mood changes.   Objective  Blood pressure 132/78, pulse 81, height 5\' 10"  (1.778 m), weight 234 lb (106.1 kg), SpO2 98 %. Systems examined below as of 07/29/17   General: No apparent distress alert and oriented x3 mood and affect normal, dressed appropriately.  HEENT: Pupils equal, extraocular movements intact  Respiratory: Patient's speak in full sentences and does not appear short of breath  Cardiovascular: No lower extremity edema, non tender, no erythema  Skin: Warm dry intact with no signs of infection or rash on extremities or on axial skeleton.  Abdomen: Soft nontender  Neuro: Cranial nerves II through XII are intact, neurovascularly intact in  all extremities with 2+ DTRs and 2+ pulses.  Lymph: No lymphadenopathy of posterior or anterior cervical chain or axillae bilaterally.  Gait normal with good balance and coordination.  MSK:  Non tender with full range of motion and good stability and symmetric strength and tone of shoulders, elbows, wrist, hip, knee  bilaterally.  Ankle: Right No visible erythema or swelling. Range of motion is full in all directions. Strength is 5/5 in all directions. Stable lateral and medial ligaments; squeeze test and kleiger test unremarkable; Talar dome nontender; No pain at base of 5th MT; No tenderness  over cuboid; No tenderness over N spot or navicular prominence No tenderness on posterior aspects of lateral and medial malleolus Peroneal tendons does have some mild swelling as well as tenderness. Negative tarsal tunnel tinel's Able to walk 4 steps. Contralateral ankle unremarkable  MSK US performed of: Right ankle This study was ordered, performed, and interpreted by Terrilee Files D.O.  Foot/Ankle:   Talar dome unremarkable  Ankle mortise without effusion. Peroneal tendon does show some subluxation noted.  Hypoechoic changes within the area. Posterior tibialis, flexor hallucis longus, and flexor digitorum longus tendons unremarkable on long and transverse views without sheath effusions. Achilles tendon visualized along length of tendon and unremarkable on long and transverse views without sheath effusion. Anterior Talofibular Ligament and Calcaneofibular Ligaments unremarkable and intact. Deltoid Ligament unremarkable and intact. Plantar fascia intact and without effusion, normal thickness. No increased doppler signal, cap sign, or thickening of tibial cortex. Power doppler signal normal.  Impression peroneal subluxation chronic      Impression and Recommendations:     This case required medical decision making of moderate complexity.      Note: This dictation was prepared with Dragon dictation along with smaller phrase technology. Any transcriptional errors that result from this process are unintentional.

## 2017-07-29 NOTE — Patient Instructions (Signed)
Good to see you  Ice 20 minutes 2 times daily. Usually after activity and before bed. Exercises 3 times a week.  [email or web address] size large x-linked ankle brace pennsaid pinkie amount topically 2 times daily as needed.  Allopurinol 200mg  daily  Avoid being barefoot Avoid running for at least a couple weeks.  See me again in 4 weeks

## 2017-08-27 MED FILL — ALLOPURINOL 100 MG TABLET: 100 | 30 days supply | Qty: 60 | Fill #0

## 2017-08-29 NOTE — Progress Notes (Signed)
Tawana ScaleZach Smith D.O. [facility] [address] [place], [place] [address] Phone: [phone] Subjective:     CC: Right ankle pain  EXB:[id number]:Subjective  Michael AmsterdamWilliam H Hart is a 32 y.o. male coming in with complaint of right ankle pain.  Right-sided peroneal subluxation as well as gout.  Patient was given home exercises, icing regimen, topical anti-inflammatories.  Patient states that he is doing better. He does have pain in his achilles. He feels that he is at around 92% better.     Past Medical History:  Diagnosis Date  . Allergy   . Asthma   . Gout   . Kidney stone   . Panic attacks    nocturnal, controls with self directed biofeedback   Past Surgical History:  Procedure Laterality Date  . NO PAST SURGERIES     Social History   Socioeconomic History  . Marital status: Married    Spouse name: None  . Number of children: None  . Years of education: None  . Highest education level: None  Social Needs  . Financial resource strain: None  . Food insecurity - worry: None  . Food insecurity - inability: None  . Transportation needs - medical: None  . Transportation needs - non-medical: None  Occupational History  . None  Tobacco Use  . Smoking status: Former Games developermoker  . Smokeless tobacco: Former NeurosurgeonUser    Quit date: 06/23/2011  Substance and Sexual Activity  . Alcohol use: Yes    Comment: occasional  . Drug use: No  . Sexual activity: None  Other Topics Concern  . 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 Penicillins   . Sulfa Antibiotics    Family History  Problem Relation Age of Onset  . Breast cancer Mother   . Lung cancer Father   . Heart disease Father        CABG  . Hypertension Other   . Stroke Other      Past medical history, social, surgical and family history all reviewed in electronic medical record.  No pertanent information unless stated  regarding to the chief complaint.   Review of Systems:Review of systems updated and as accurate as of 08/30/17  No headache, visual changes, nausea, vomiting, diarrhea, constipation, dizziness, abdominal pain, skin rash, fevers, chills, night sweats, weight loss, swollen lymph nodes, body aches, joint swelling, muscle aches, chest pain, shortness of breath, mood changes.   Objective  Blood pressure 110/72, pulse 76, height 5\' 10"  (1.778 m), weight 234 lb (106.1 kg), SpO2 96 %. Systems examined below as of 08/30/17   General: No apparent distress alert and oriented x3 mood and affect normal, dressed appropriately.  HEENT: Pupils equal, extraocular movements intact  Respiratory: Patient's speak in full sentences and does not appear short of breath  Cardiovascular: No lower extremity edema, non tender, no erythema  Skin: Warm dry intact with no signs of infection or rash on extremities or on axial skeleton.  Abdomen: Soft nontender  Neuro: Cranial nerves II through XII are intact, neurovascularly intact in all extremities with 2+ DTRs and 2+ pulses.  Lymph: No lymphadenopathy of posterior or anterior cervical chain or axillae bilaterally.  Gait normal with good balance and coordination.  MSK:  Non tender with full range of motion and good stability and symmetric strength and tone of shoulders, elbows, wrist, hip, knee and ankles bilaterally.  Foot  exam shows the patient does have mild acne of the transverse arch.  Still some mild tenderness noted over the peroneal tendons on the right side.  Mild pain over the Achilles as well.    Impression and Recommendations:     This case required medical decision making of moderate complexity.      Note: This dictation was prepared with Dragon dictation along with smaller phrase technology. Any transcriptional errors that result from this process are unintentional.

## 2017-08-30 ENCOUNTER — Ambulatory Visit: Payer: 59 | Admitting: Family Medicine

## 2017-08-30 ENCOUNTER — Encounter: Payer: Self-pay | Admitting: Family Medicine

## 2017-08-30 DIAGNOSIS — M24471 Recurrent dislocation, right ankle: Secondary | ICD-10-CM

## 2017-08-30 NOTE — Assessment & Plan Note (Signed)
Discussed compression, home exercise, continue the allopurinol.  Follow-up with me again in 6 weeks

## 2017-08-30 NOTE — Patient Instructions (Addendum)
Allopurinol still at 200  Continue the exercises Heel lift (1/16inch) in the shoes Try the body helix with walking then.  Start a walk-run progression April 1st   - Initially start one minute walking than one minute running for 20 mins in the first week,   then 25 mins during the second week, then 30 mins afterwards. No more then runnign 3 times a week.  Once you have reached 30 mins: - Run 2 mins, then walk 1 min. -Then run 3 mins, and walk 1 min. -Then run 4 mins, and walk 1 min. -Then run 5 mins, and walk 1 min. -Slowly build up weekly to running 30 mins nonstop.  If painful at any of the steps, back up one step.  Send me a message in 4 weeks and tell me how you are doing.  See me again in 4-6 weeks

## 2017-09-09 ENCOUNTER — Encounter: Payer: Self-pay | Admitting: Family Medicine

## 2017-09-27 MED FILL — ALLOPURINOL 100 MG TABLET: 100 | 30 days supply | Qty: 60 | Fill #1

## 2017-10-14 ENCOUNTER — Ambulatory Visit: Payer: 59 | Admitting: Family Medicine

## 2017-11-19 MED FILL — ALLOPURINOL 100 MG TABLET: 100 | 30 days supply | Qty: 60 | Fill #2

## 2018-01-03 MED FILL — ALLOPURINOL 100 MG TABLET: 100 | 30 days supply | Qty: 60 | Fill #3

## 2018-01-05 DIAGNOSIS — H5213 Myopia, bilateral: Secondary | ICD-10-CM | POA: Diagnosis not present

## 2018-01-05 DIAGNOSIS — H52223 Regular astigmatism, bilateral: Secondary | ICD-10-CM | POA: Diagnosis not present

## 2018-04-07 NOTE — Patient Instructions (Addendum)
For anxiety -we can try effexor, prozac or zoloft.  It would be once a day.  Let me know.    Tests ordered today. Your results will be released to MyChart (or called to you) after review, usually within 72hours after test completion. If any changes need to be made, you will be notified at that same time.  All other Health Maintenance issues reviewed.   All recommended immunizations and age-appropriate screenings are up-to-date or discussed.  No immunizations administered today.   Medications reviewed and updated.  Changes include :   none   Please followup in one year    Health Maintenance, Male A healthy lifestyle and preventive care is important for your health and wellness. Ask your health care provider about what schedule of regular examinations is right for you. What should I know about weight and diet? Eat a Healthy Diet  Eat plenty of vegetables, fruits, whole grains, low-fat dairy products, and lean protein.  Do not eat a lot of foods high in solid fats, added sugars, or salt.  Maintain a Healthy Weight Regular exercise can help you achieve or maintain a healthy weight. You should:  Do at least 150 minutes of exercise each week. The exercise should increase your heart rate and make you sweat (moderate-intensity exercise).  Do strength-training exercises at least twice a week.  Watch Your Levels of Cholesterol and Blood Lipids  Have your blood tested for lipids and cholesterol every 5 years starting at 34 years of age. If you are at high risk for heart disease, you should start having your blood tested when you are 34 years old. You may need to have your cholesterol levels checked more often if: ? Your lipid or cholesterol levels are high. ? You are older than 34 years of age. ? You are at high risk for heart disease.  What should I know about cancer screening? Many types of cancers can be detected early and may often be prevented. Lung Cancer  You should be screened  every year for lung cancer if: ? You are a current smoker who has smoked for at least 30 years. ? You are a former smoker who has quit within the past 15 years.  Talk to your health care provider about your screening options, when you should start screening, and how often you should be screened.  Colorectal Cancer  Routine colorectal cancer screening usually begins at 34 years of age and should be repeated every 5-10 years until you are 34 years old. You may need to be screened more often if early forms of precancerous polyps or small growths are found. Your health care provider may recommend screening at an earlier age if you have risk factors for colon cancer.  Your health care provider may recommend using home test kits to check for hidden blood in the stool.  A small camera at the end of a tube can be used to examine your colon (sigmoidoscopy or colonoscopy). This checks for the earliest forms of colorectal cancer.  Prostate and Testicular Cancer  Depending on your age and overall health, your health care provider may do certain tests to screen for prostate and testicular cancer.  Talk to your health care provider about any symptoms or concerns you have about testicular or prostate cancer.  Skin Cancer  Check your skin from head to toe regularly.  Tell your health care provider about any new moles or changes in moles, especially if: ? There is a change in a mole's  size, shape, or color. ? You have a mole that is larger than a pencil eraser.  Always use sunscreen. Apply sunscreen liberally and repeat throughout the day.  Protect yourself by wearing long sleeves, pants, a wide-brimmed hat, and sunglasses when outside.  What should I know about heart disease, diabetes, and high blood pressure?  If you are 65-57 years of age, have your blood pressure checked every 3-5 years. If you are 15 years of age or older, have your blood pressure checked every year. You should have your blood  pressure measured twice-once when you are at a hospital or clinic, and once when you are not at a hospital or clinic. Record the average of the two measurements. To check your blood pressure when you are not at a hospital or clinic, you can use: ? An automated blood pressure machine at a pharmacy. ? A home blood pressure monitor.  Talk to your health care provider about your target blood pressure.  If you are between 74-40 years old, ask your health care provider if you should take aspirin to prevent heart disease.  Have regular diabetes screenings by checking your fasting blood sugar level. ? If you are at a normal weight and have a low risk for diabetes, have this test once every three years after the age of 64. ? If you are overweight and have a high risk for diabetes, consider being tested at a younger age or more often.  A one-time screening for abdominal aortic aneurysm (AAA) by ultrasound is recommended for men aged 65-75 years who are current or former smokers. What should I know about preventing infection? Hepatitis B If you have a higher risk for hepatitis B, you should be screened for this virus. Talk with your health care provider to find out if you are at risk for hepatitis B infection. Hepatitis C Blood testing is recommended for:  Everyone born from [birth year] through 1967.  Anyone with known risk factors for hepatitis C.  Sexually Transmitted Diseases (STDs)  You should be screened each year for STDs including gonorrhea and chlamydia if: ? You are sexually active and are younger than 34 years of age. ? You are older than 34 years of age and your health care provider tells you that you are at risk for this type of infection. ? Your sexual activity has changed since you were last screened and you are at an increased risk for chlamydia or gonorrhea. Ask your health care provider if you are at risk.  Talk with your health care provider about whether you are at high risk of being  infected with HIV. Your health care provider may recommend a prescription medicine to help prevent HIV infection.  What else can I do?  Schedule regular health, dental, and eye exams.  Stay current with your vaccines (immunizations).  Do not use any tobacco products, such as cigarettes, chewing tobacco, and e-cigarettes. If you need help quitting, ask your health care provider.  Limit alcohol intake to no more than 2 drinks per day. One drink equals 12 ounces of beer, 5 ounces of wine, or 1 ounces of hard liquor.  Do not use street drugs.  Do not share needles.  Ask your health care provider for help if you need support or information about quitting drugs.  Tell your health care provider if you often feel depressed.  Tell your health care provider if you have ever been abused or do not feel safe at home. This information is not  intended to replace advice given to you by your health care provider. Make sure you discuss any questions you have with your health care provider. Document Released: 12/05/2007 Document Revised: 02/05/2016 Document Reviewed: 03/12/2015 Elsevier Interactive Patient Education  [employer].

## 2018-04-07 NOTE — Progress Notes (Signed)
Subjective:    Patient ID: Michael Hart, male    DOB: 1986/05/04, 32 y.o.   MRN: [id number]  HPI He is here for a physical exam.   He denies any major changes since he was here last.  He does state some anxiety.  He is unsure if he wants to take anything or not.  He has been able to lose weight since he was here last.  He is really concentrate on his diet is exercising some, but could exercise more.  He has no major concerns.  Medications and allergies reviewed with patient and updated if appropriate.  Patient Active Problem List   Diagnosis Date Noted  . Chronic or recurrent subluxation of peroneal tendon of right foot 07/29/2017  . Hematuria 04/05/2017  . History of gout 10/22/2015  . Low testosterone 10/22/2015  . Esophageal reflux 10/22/2015  . Mixed dyslipidemia 12/05/2014  . Obese     Current Outpatient Medications on File Prior to Visit  Medication Sig Dispense Refill  . allopurinol (ZYLOPRIM) 100 MG tablet Take 2 tablets (200 mg total) by mouth daily. 60 tablet 6   No current facility-administered medications on file prior to visit.     Past Medical History:  Diagnosis Date  . Allergy   . Asthma   . Gout   . Kidney stone   . Panic attacks    nocturnal, controls with self directed biofeedback    Past Surgical History:  Procedure Laterality Date  . NO PAST SURGERIES      Social History   Socioeconomic History  . Marital status: Married    Spouse name: Not on file  . Number of children: Not on file  . Years of education: Not on file  . Highest education level: Not on file  Occupational History  . Not on file  Social Needs  . Financial resource strain: Not on file  . Food insecurity:    Worry: Not on file    Inability: Not on file  . Transportation needs:    Medical: Not on file    Non-medical: Not on file  Tobacco Use  . Smoking status: Former Games developer  . Smokeless tobacco: Former Neurosurgeon    Quit date: 06/23/2011  Substance and Sexual Activity   . Alcohol use: Yes    Comment: occasional  . Drug use: No  . Sexual activity: Not on file  Lifestyle  . Physical activity:    Days per week: Not on file    Minutes per session: Not on file  . Stress: Not on file  Relationships  . Social connections:    Talks on phone: Not on file    Gets together: Not on file    Attends religious service: Not on file    Active member of club or organization: Not on file    Attends meetings of clubs or organizations: Not on file    Relationship status: Not on file  Other Topics Concern  . Not on file  Social History Narrative   Married, lives with spouse   Engineer, maintenance (IT), works in Furniture conservator/restorer    Family History  Problem Relation Age of Onset  . Breast cancer Mother   . Lung cancer Father   . Heart disease Father        CABG  . Hypertension Other   . Stroke Other     Review of Systems  Constitutional: Negative for chills, fatigue and fever.  Eyes: Negative for visual disturbance.  Respiratory: Negative for cough, shortness of breath and wheezing.   Cardiovascular: Negative for chest pain, palpitations and leg swelling.  Gastrointestinal: Negative for abdominal pain, blood in stool, constipation, diarrhea and nausea.       No gerd  Genitourinary: Negative for dysuria and hematuria.  Musculoskeletal: Negative for arthralgias and back pain.  Skin: Negative for color change and rash.  Neurological: Negative for light-headedness and headaches.  Psychiatric/Behavioral: Negative for dysphoric mood and sleep disturbance. The patient is nervous/anxious.        Objective:   Vitals:   04/08/18 1457  BP: 130/84  Pulse: 73  Resp: 16  Temp: 98.2 F (36.8 C)  SpO2: 98%   Filed Weights   04/08/18 1457  Weight: 222 lb 12.8 oz (101.1 kg)   Body mass index is 31.97 kg/m.  Wt Readings from Last 3 Encounters:  04/08/18 222 lb 12.8 oz (101.1 kg)  08/30/17 234 lb (106.1 kg)  07/29/17 234 lb (106.1 kg)     Physical  Exam Constitutional: He appears well-developed and well-nourished. No distress.  HENT:  Head: Normocephalic and atraumatic.  Right Ear: External ear normal.  Left Ear: External ear normal.  Mouth/Throat: Oropharynx is clear and moist.  Normal ear canals and TM b/l  Eyes: Conjunctivae and EOM are normal.  Neck: Neck supple. No tracheal deviation present. No thyromegaly present.  No carotid bruit  Cardiovascular: Normal rate, regular rhythm, normal heart sounds and intact distal pulses.   No murmur heard. Pulmonary/Chest: Effort normal and breath sounds normal. No respiratory distress. He has no wheezes. He has no rales.  Abdominal: Soft. He exhibits no distension. There is no tenderness.  Genitourinary: deferred  Musculoskeletal: He exhibits no edema.  Lymphadenopathy:   He has no cervical adenopathy.  Skin: Skin is warm and dry. He is not diaphoretic.  Psychiatric: He has a normal mood and affect. His behavior is normal.         Assessment & Plan:   Physical exam: Screening blood work ordered Immunizations     td up to date, flu vaccine given at work Exercise  running occasionally, some boot camp - not as much as he should Weight   Working on weight loss Skin no concerns Substance abuse   none  See Problem List for Assessment and Plan of chronic medical problems.   Follow-up annually, sooner if needed

## 2018-04-08 ENCOUNTER — Encounter: Payer: Self-pay | Admitting: Internal Medicine

## 2018-04-08 ENCOUNTER — Other Ambulatory Visit (INDEPENDENT_AMBULATORY_CARE_PROVIDER_SITE_OTHER): Payer: 59

## 2018-04-08 ENCOUNTER — Ambulatory Visit (INDEPENDENT_AMBULATORY_CARE_PROVIDER_SITE_OTHER): Payer: 59 | Admitting: Internal Medicine

## 2018-04-08 VITALS — BP 130/84 | HR 73 | Temp 98.2°F | Resp 16 | Ht 70.0 in | Wt 222.8 lb

## 2018-04-08 DIAGNOSIS — Z8739 Personal history of other diseases of the musculoskeletal system and connective tissue: Secondary | ICD-10-CM | POA: Diagnosis not present

## 2018-04-08 DIAGNOSIS — E782 Mixed hyperlipidemia: Secondary | ICD-10-CM

## 2018-04-08 DIAGNOSIS — F419 Anxiety disorder, unspecified: Secondary | ICD-10-CM | POA: Diagnosis not present

## 2018-04-08 DIAGNOSIS — Z0000 Encounter for general adult medical examination without abnormal findings: Secondary | ICD-10-CM | POA: Diagnosis not present

## 2018-04-08 DIAGNOSIS — K219 Gastro-esophageal reflux disease without esophagitis: Secondary | ICD-10-CM

## 2018-04-08 DIAGNOSIS — E6609 Other obesity due to excess calories: Secondary | ICD-10-CM

## 2018-04-08 DIAGNOSIS — R7989 Other specified abnormal findings of blood chemistry: Secondary | ICD-10-CM | POA: Diagnosis not present

## 2018-04-08 LAB — COMPREHENSIVE METABOLIC PANEL
ALT: 25 U/L (ref 0–53)
AST: 14 U/L (ref 0–37)
Albumin: 5.2 g/dL (ref 3.5–5.2)
Alkaline Phosphatase: 50 U/L (ref 39–117)
BUN: 18 mg/dL (ref 6–23)
CO2: 28 mEq/L (ref 19–32)
Calcium: 10 mg/dL (ref 8.4–10.5)
Chloride: 99 mEq/L (ref 96–112)
Creatinine, Ser: 1.04 mg/dL (ref 0.40–1.50)
GFR: 86.64 mL/min (ref >60.00)
Glucose, Bld: 89 mg/dL (ref 70–99)
Potassium: 3.8 mEq/L (ref 3.5–5.1)
Sodium: 137 mEq/L (ref 135–145)
Total Bilirubin: 0.7 mg/dL (ref 0.2–1.2)
Total Protein: 8.4 g/dL — ABNORMAL HIGH (ref 6.0–8.3)

## 2018-04-08 LAB — LDL CHOLESTEROL, DIRECT: Direct LDL: 181 mg/dL

## 2018-04-08 LAB — CBC WITH DIFFERENTIAL/PLATELET
Basophils Absolute: 0.1 10*3/uL (ref 0.0–0.1)
Basophils Relative: 0.9 % (ref 0.0–3.0)
Eosinophils Absolute: 0.1 10*3/uL (ref 0.0–0.7)
Eosinophils Relative: 1.4 % (ref 0.0–5.0)
HCT: 45.5 % (ref 39.0–52.0)
Hemoglobin: 16.2 g/dL (ref 13.0–17.0)
Lymphocytes Relative: 29.3 % (ref 12.0–46.0)
Lymphs Abs: 2.3 10*3/uL (ref 0.7–4.0)
MCHC: 35.5 g/dL (ref 30.0–36.0)
MCV: 85.8 fl (ref 78.0–100.0)
Monocytes Absolute: 0.7 10*3/uL (ref 0.1–1.0)
Monocytes Relative: 8.6 % (ref 3.0–12.0)
Neutro Abs: 4.6 10*3/uL (ref 1.4–7.7)
Neutrophils Relative %: 59.8 % (ref 43.0–77.0)
Platelets: 301 10*3/uL (ref 150.0–400.0)
RBC: 5.31 Mil/uL (ref 4.22–5.81)
RDW: 12.2 % (ref 11.5–15.5)
WBC: 7.7 10*3/uL (ref 4.0–10.5)

## 2018-04-08 LAB — LIPID PANEL
Cholesterol: 240 mg/dL — ABNORMAL HIGH (ref 0–200)
HDL: 32.1 mg/dL — ABNORMAL LOW (ref >39.00)
NonHDL: 207.82
Total CHOL/HDL Ratio: 7
Triglycerides: 243 mg/dL — ABNORMAL HIGH (ref 0.0–149.0)
VLDL: 48.6 mg/dL — ABNORMAL HIGH (ref 0.0–40.0)

## 2018-04-08 LAB — URIC ACID: Uric Acid, Serum: 9.9 mg/dL — ABNORMAL HIGH (ref 4.0–7.8)

## 2018-04-08 LAB — TSH: TSH: 2.29 u[IU]/mL (ref 0.35–4.50)

## 2018-04-08 NOTE — Assessment & Plan Note (Signed)
Working on weight loss Continue dietary changes Increase exercise if possible

## 2018-04-08 NOTE — Assessment & Plan Note (Signed)
Will check uric acid level Taking allopurinol 200 mg daily No gout symptoms since starting allopurinol

## 2018-04-08 NOTE — Assessment & Plan Note (Signed)
Testosterone level low in the past and was on Clomid Not sure if he wants to do supplementation or not We will check testosterone levels and was referred back to urology if he is interested in replacement

## 2018-04-08 NOTE — Assessment & Plan Note (Signed)
Resolved with weight loss.   

## 2018-04-08 NOTE — Assessment & Plan Note (Signed)
He is having some anxiety that is fairly He may be interested in a daily medication, but is unsure at this time He would prefer not to take something as needed Gave him a list of some medications including sertraline, Prozac and Effexor-he will let me know if he wants to try 1

## 2018-04-08 NOTE — Assessment & Plan Note (Signed)
Check lipid panel, TSH, CMP He has lost weight and is eating better Increase exercise Continue weight loss efforts

## 2018-04-11 ENCOUNTER — Encounter: Payer: Self-pay | Admitting: Internal Medicine

## 2018-04-12 LAB — TESTOSTERONE, FREE & TOTAL
Free Testosterone: 48.3 pg/mL (ref 35.0–155.0)
Testosterone, Total, LC-MS-MS: 261 ng/dL (ref 250–1100)

## 2018-04-12 MED ORDER — VENLAFAXINE HCL ER 37.5 MG PO CP24: 37.5000 mg | ORAL_CAPSULE | Freq: Every day | ORAL | 5 refills | Status: DC

## 2018-04-12 MED FILL — VENLAFAXINE HCL ER 37.5 MG: 37.5 | 30 days supply | Qty: 30 | Fill #0

## 2018-04-12 NOTE — Addendum Note (Signed)
Addended by: Deatra James on: 04/12/2018 02:01 PM   Modules accepted: Orders

## 2018-04-12 NOTE — Telephone Encounter (Signed)
Resent rx [facility]!

## 2018-04-14 ENCOUNTER — Encounter: Payer: Self-pay | Admitting: Internal Medicine

## 2018-04-14 DIAGNOSIS — R7989 Other specified abnormal findings of blood chemistry: Secondary | ICD-10-CM

## 2018-04-14 DIAGNOSIS — E782 Mixed hyperlipidemia: Secondary | ICD-10-CM

## 2018-04-14 DIAGNOSIS — Z8739 Personal history of other diseases of the musculoskeletal system and connective tissue: Secondary | ICD-10-CM

## 2018-04-14 MED ORDER — ALLOPURINOL 300 MG PO TABS: 300.0000 mg | ORAL_TABLET | Freq: Every day | ORAL | 1 refills | Status: DC

## 2018-04-14 MED FILL — ALLOPURINOL 300 MG TAB: 300 | 90 days supply | Qty: 90 | Fill #0

## 2018-05-17 MED FILL — VENLAFAXINE HCL ER 37.5 MG: 37.5 | 30 days supply | Qty: 30 | Fill #0

## 2018-06-28 MED FILL — VENLAFAXINE HCL ER 37.5 MG: 37.5 | 30 days supply | Qty: 30 | Fill #1

## 2018-08-10 MED FILL — VENLAFAXINE HCL ER 37.5 MG: 37.5 | 30 days supply | Qty: 30 | Fill #2 | Status: TO

## 2018-08-10 MED FILL — ALLOPURINOL 300 MG TABS: 300 | 90 days supply | Qty: 90 | Fill #0

## 2018-08-29 DIAGNOSIS — Z3009 Encounter for other general counseling and advice on contraception: Secondary | ICD-10-CM | POA: Diagnosis not present

## 2018-08-29 DIAGNOSIS — N35011 Post-traumatic bulbous urethral stricture: Secondary | ICD-10-CM | POA: Diagnosis not present

## 2018-08-29 DIAGNOSIS — E291 Testicular hypofunction: Secondary | ICD-10-CM | POA: Diagnosis not present

## 2018-09-07 DIAGNOSIS — E291 Testicular hypofunction: Secondary | ICD-10-CM | POA: Diagnosis not present

## 2018-09-09 MED FILL — CLOMIPHENE CITRATE 50 MG TA: 50 | 28 days supply | Qty: 8 | Fill #0

## 2018-09-22 MED FILL — VENLAFAXINE HCL ER 37.5 MG: 37.5 | 30 days supply | Qty: 30 | Fill #0

## 2018-10-01 MED FILL — CLOMIPHENE CITRATE 50 MG TA: 50 | 30 days supply | Qty: 15 | Fill #0

## 2018-10-21 DIAGNOSIS — E291 Testicular hypofunction: Secondary | ICD-10-CM | POA: Diagnosis not present

## 2018-10-22 MED FILL — VENLAFAXINE HCL ER 37.5 MG: 37.5 | 30 days supply | Qty: 30 | Fill #1

## 2018-10-25 MED FILL — CLOMIPHENE CITRATE 50 MG TA: 50 | 30 days supply | Qty: 15 | Fill #1

## 2018-11-21 MED FILL — VENLAFAXINE HCL ER 37.5 MG: 37.5 | 30 days supply | Qty: 30 | Fill #2

## 2018-11-25 MED FILL — CLOMIPHENE CITRATE 50 MG TA: 50 | 30 days supply | Qty: 15 | Fill #2

## 2018-11-30 ENCOUNTER — Other Ambulatory Visit: Payer: Self-pay

## 2018-11-30 ENCOUNTER — Ambulatory Visit: Payer: Self-pay

## 2018-11-30 ENCOUNTER — Ambulatory Visit: Payer: 59 | Admitting: Family Medicine

## 2018-11-30 ENCOUNTER — Encounter: Payer: Self-pay | Admitting: Family Medicine

## 2018-11-30 VITALS — BP 118/70 | HR 87 | Ht 70.0 in | Wt 239.0 lb

## 2018-11-30 DIAGNOSIS — G5602 Carpal tunnel syndrome, left upper limb: Secondary | ICD-10-CM

## 2018-11-30 DIAGNOSIS — M25532 Pain in left wrist: Secondary | ICD-10-CM

## 2018-11-30 MED ORDER — GABAPENTIN 100 MG PO CAPS: 200.0000 mg | ORAL_CAPSULE | Freq: Every day | ORAL | 0 refills | Status: DC

## 2018-11-30 MED ORDER — ALLOPURINOL 300 MG PO TABS: 300.0000 mg | ORAL_TABLET | Freq: Every day | ORAL | 1 refills | Status: DC

## 2018-11-30 MED FILL — ALLOPURINOL 300 MG TABS: 300 | 90 days supply | Qty: 90 | Fill #0

## 2018-11-30 MED FILL — GABAPENTIN 100 MG CAPSULE: 100 | 90 days supply | Qty: 180 | Fill #0

## 2018-11-30 NOTE — Patient Instructions (Addendum)
Good to see you Exercises 3x a week Tart Cherry 1200 mg at bedtime Mitigare: One pill twice a day for 3 days Brace night and day for 2 weeks, then just during the day for 1 week. See me again in 5-6 weeks

## 2018-11-30 NOTE — Assessment & Plan Note (Signed)
Carpal tunnel syndrome.  We discussed with patient in great length.  Will do bracing, continue the same medications.  Patient is given a short course of colchicine as well that I think will be beneficial.  We will continue the allopurinol and the tart cherry extract for the gout.  Worsening symptoms will consider injection at follow-up.  Follow-up again 4 to 6 weeks

## 2018-11-30 NOTE — Progress Notes (Signed)
Tawana ScaleZach Janyah Singleterry D.O. [facility] [address] [place], [place] [address] Phone: [phone] Subjective:   Michael Hart, Michael Hart, am serving as a scribe for Dr. Antoine PrimasZachary Emiline Mancebo.  I'm seeing this patient by the request  of:  Pincus SanesBurns, Stacy J, MD  CC: Left hand numbness  BJY:NWGNFAOZHYHPI:Subjective  Michael Hart is a 33 y.o. male coming in with complaint of left hand pain. States that he was helping someone pressure wash a deck for 4 hours. Then he went kayaking and has been playing guitar more than usual. Is having numbness in his fingertips. Stopped playing guitar for a few days and this has decreased his pain. Pain is temporarily relieved when he reaches his arm overhead. Has also taken Allopurinol.       Past Medical History:  Diagnosis Date  . Allergy   . Asthma   . Gout   . Kidney stone   . Panic attacks    nocturnal, controls with self directed biofeedback   Past Surgical History:  Procedure Laterality Date  . NO PAST SURGERIES     Social History   Socioeconomic History  . Marital status: Married    Spouse name: Not on file  . Number of children: Not on file  . Years of education: Not on file  . Highest education level: Not on file  Occupational History  . Not on file  Social Needs  . Financial resource strain: Not on file  . Food insecurity:    Worry: Not on file    Inability: Not on file  . Transportation needs:    Medical: Not on file    Non-medical: Not on file  Tobacco Use  . Smoking status: Former Games developermoker  . Smokeless tobacco: Former NeurosurgeonUser    Quit date: 06/23/2011  Substance and Sexual Activity  . Alcohol use: Yes    Comment: occasional  . Drug use: No  . Sexual activity: Not on file  Lifestyle  . Physical activity:    Days per week: Not on file    Minutes per session: Not on file  . Stress: Not on file  Relationships  . Social connections:    Talks on phone: Not on file    Gets together: Not on file    Attends religious service: Not on file    Active  member of club or organization: Not on file    Attends meetings of clubs or organizations: Not on file    Relationship status: Not on file  Other Topics Concern  . Not on file  Social History Narrative   Married, lives with spouse   Engineer, maintenance (IT)College graduate, 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   Allergies  Allergen Reactions  . Penicillins   . Sulfa Antibiotics    Family History  Problem Relation Age of Onset  . Breast cancer Mother   . Lung cancer Father   . Heart disease Father        CABG  . COPD Father   . Hypertension Other   . Stroke Other        Current Outpatient Medications (Analgesics):  .  allopurinol (ZYLOPRIM) 300 MG tablet, Take 1 tablet (300 mg total) by mouth daily.   Current Outpatient Medications (Other):  .  venlafaxine XR (EFFEXOR XR) 37.5 MG 24 hr capsule, Take 1 capsule (37.5 mg total) by mouth daily with breakfast. .  gabapentin (NEURONTIN) 100 MG capsule, Take 2 capsules (200 mg total) by mouth at bedtime.    Past medical history,  social, surgical and family history all reviewed in electronic medical record.  No pertanent information unless stated regarding to the chief complaint.   Review of Systems:  No headache, visual changes, nausea, vomiting, diarrhea, constipation, dizziness, abdominal pain, skin rash, fevers, chills, night sweats, weight loss, swollen lymph nodes, body aches, joint swelling, muscle aches, chest pain, shortness of breath, mood changes.   Objective  Blood pressure 118/70, pulse 87, height 5\' 10"  (1.778 m), weight 239 lb (108.4 kg), SpO2 98 %.    General: No apparent distress alert and oriented x3 mood and affect normal, dressed appropriately.  HEENT: Pupils equal, extraocular movements intact  Respiratory: Patient's speak in full sentences and does not appear short of breath  Cardiovascular: No lower extremity edema, non tender, no erythema  Skin: Warm dry intact with no signs of infection or rash on extremities or on axial skeleton.   Abdomen: Soft nontender  Neuro: Cranial nerves II through XII are intact, neurovascularly intact in all extremities with 2+ DTRs and 2+ pulses.  Lymph: No lymphadenopathy of posterior or anterior cervical chain or axillae bilaterally.  Gait normal with good balance and coordination.  MSK:  Non tender with full range of motion and good stability and symmetric strength and tone of shoulders, elbows, wrist, hip, knee and ankles bilaterally.  Wrist exam shows the patient is some tenderness to palpation over the palmar aspect of the wrist.  Patient does have near full range of motion.  Mild positive Tinel's test noted.  Positive Phalen's noted.  Good grip strength.  Neck exam is unremarkable.  Full range of motion.  Negative Spurling's.  Limited musculoskeletal ultrasound was performed and interpreted Lyndal Pulley  Limited ultrasound of patient's carpal tunnel or median nerve does show some scarring noted.  Bifid nerve appreciated.  Minimal hypoechoic changes.  Possible gouty deposits noted.    Impression and Recommendations:     This case required medical decision making of moderate complexity. The above documentation has been reviewed and is accurate and complete Lyndal Pulley, DO       Note: This dictation was prepared with Dragon dictation along with smaller phrase technology. Any transcriptional errors that result from this process are unintentional.

## 2018-12-23 MED FILL — CLOMIPHENE CITRATE 50 MG TA: 50 | 30 days supply | Qty: 15 | Fill #3

## 2019-01-02 MED FILL — VENLAFAXINE HCL ER 37.5 MG: 37.5 | 30 days supply | Qty: 30 | Fill #1

## 2019-01-04 ENCOUNTER — Ambulatory Visit: Payer: 59 | Admitting: Family Medicine

## 2019-02-04 NOTE — Progress Notes (Signed)
Tawana ScaleZach Harshini Trent D.O. [facility] [address] [place], [place] [address] Phone: [phone]) [phone] Subjective:   Michael Hart, Michael Hart, am serving as a scribe for Dr. Antoine PrimasZachary Therma Lasure.   CC: Left wrist pain follow-up  BJY:NWGNFAOZHYHPI:Subjective   11/30/2018 Carpal tunnel syndrome.  We discussed with patient in great length.  Will do bracing, continue the same medications.  Patient is given a short course of colchicine as well that I think will be beneficial.  We will continue the allopurinol and the tart cherry extract for the gout.  Worsening symptoms will consider injection at follow-up.  Follow-up again 4 to 6 weeks  Update 02/06/2019 Michael AmsterdamWilliam H Hart is a 33 y.o. male coming in with complaint of left wrist pain. Patient states that his pain is much better. Is able to play guitar without pain. Is using gabapentin. Using allopurinol and tart cherry daily.    Patient's previous ultrasound did show some possible gouty deposits as well as history consistent with more of a carpal tunnel.  Past Medical History:  Diagnosis Date  . Allergy   . Asthma   . Gout   . Kidney stone   . Panic attacks    nocturnal, controls with self directed biofeedback   Past Surgical History:  Procedure Laterality Date  . NO PAST SURGERIES     Social History   Socioeconomic History  . Marital status: Married    Spouse name: Not on file  . Number of children: Not on file  . Years of education: Not on file  . Highest education level: Not on file  Occupational History  . Not on file  Social Needs  . Financial resource strain: Not on file  . Food insecurity    Worry: Not on file    Inability: Not on file  . Transportation needs    Medical: Not on file    Non-medical: Not on file  Tobacco Use  . Smoking status: Former Games developermoker  . Smokeless tobacco: Former NeurosurgeonUser    Quit date: 06/23/2011  Substance and Sexual Activity  . Alcohol use: Yes    Comment: occasional  . Drug use: No  . Sexual activity: Not on file   Lifestyle  . Physical activity    Days per week: Not on file    Minutes per session: Not on file  . Stress: Not on file  Relationships  . Social Musicianconnections    Talks on phone: Not on file    Gets together: Not on file    Attends religious service: Not on file    Active member of club or organization: Not on file    Attends meetings of clubs or organizations: Not on file    Relationship status: Not on file  Other Topics Concern  . Not on file  Social History Narrative   Married, lives with spouse   Engineer, maintenance (IT)College graduate, 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   Allergies  Allergen Reactions  . Penicillins   . Sulfa Antibiotics    Family History  Problem Relation Age of Onset  . Breast cancer Mother   . Lung cancer Father   . Heart disease Father        CABG  . COPD Father   . Hypertension Other   . Stroke Other        Current Outpatient Medications (Analgesics):  .  allopurinol (ZYLOPRIM) 300 MG tablet, Take 1 tablet (300 mg total) by mouth daily.   Current Outpatient Medications (Other):  .  gabapentin (NEURONTIN)  100 MG capsule, Take 2 capsules (200 mg total) by mouth at bedtime. Marland Kitchen  venlafaxine XR (EFFEXOR XR) 37.5 MG 24 hr capsule, Take 1 capsule (37.5 mg total) by mouth daily with breakfast.    Past medical history, social, surgical and family history all reviewed in electronic medical record.  No pertanent information unless stated regarding to the chief complaint.   Review of Systems:  No headache, visual changes, nausea, vomiting, diarrhea, constipation, dizziness, abdominal pain, skin rash, fevers, chills, night sweats, weight loss, swollen lymph nodes, body aches, joint swelling, muscle aches, chest pain, shortness of breath, mood changes.   Objective  Blood pressure 132/86, pulse 94, height 5\' 10"  (1.778 m), weight 248 lb (112.5 kg), SpO2 97 %.    General: No apparent distress alert and oriented x3 mood and affect normal, dressed appropriately.  HEENT: Pupils equal,  extraocular movements intact  Respiratory: Patient's speak in full sentences and does not appear short of breath  Cardiovascular: No lower extremity edema, non tender, no erythema  Skin: Warm dry intact with no signs of infection or rash on extremities or on axial skeleton.  Abdomen: Soft nontender  Neuro: Cranial nerves II through XII are intact, neurovascularly intact in all extremities with 2+ DTRs and 2+ pulses.  Lymph: No lymphadenopathy of posterior or anterior cervical chain or axillae bilaterally.  Gait normal with good balance and coordination.  MSK:  Non tender with full range of motion and good stability and symmetric strength and tone of shoulders, elbows, hip, knee and ankles bilaterally.  Left wrist exam shows the patient has full range of motion.  Minimal tenderness noted on exam. Negative Tinel sign today.  Good grip strength.  Neurovascular intact distally    Impression and Recommendations:      The above documentation has been reviewed and is accurate and complete Lyndal Pulley, DO       Note: This dictation was prepared with Dragon dictation along with smaller phrase technology. Any transcriptional errors that result from this process are unintentional.

## 2019-02-06 ENCOUNTER — Encounter: Payer: Self-pay | Admitting: Family Medicine

## 2019-02-06 ENCOUNTER — Other Ambulatory Visit: Payer: Self-pay

## 2019-02-06 ENCOUNTER — Ambulatory Visit (INDEPENDENT_AMBULATORY_CARE_PROVIDER_SITE_OTHER): Payer: 59 | Admitting: Family Medicine

## 2019-02-06 DIAGNOSIS — G5602 Carpal tunnel syndrome, left upper limb: Secondary | ICD-10-CM | POA: Diagnosis not present

## 2019-02-06 MED ORDER — VENLAFAXINE HCL ER 37.5 MG PO CP24: 37.5000 mg | ORAL_CAPSULE | Freq: Every day | ORAL | 5 refills | Status: DC

## 2019-02-06 MED FILL — VENLAFAXINE HCL ER 37.5 MG: 37.5 | 30 days supply | Qty: 30 | Fill #0

## 2019-02-06 NOTE — Assessment & Plan Note (Signed)
Patient is doing significantly better at this time.  I do believe that gout was playing a role.  Gabapentin intermittently.  Continue the allopurinol.  Follow-up as needed

## 2019-03-08 MED FILL — ALLOPURINOL 300 MG TABS: 300 | 90 days supply | Qty: 90 | Fill #1

## 2019-03-08 MED FILL — VENLAFAXINE HCL ER 37.5 MG: 37.5 | 30 days supply | Qty: 30 | Fill #1

## 2019-04-09 NOTE — Progress Notes (Signed)
Subjective:    Patient ID: Michael Hart, male    DOB: 1985-09-15, 33 y.o.   MRN: [id number]  HPI He is here for a physical exam.   His father died in 02/22/23.  He had lung cancer.  He is speaking with a Social worker.  He is taking his Effexor daily.  This was for generalized anxiety.  He does not think he needs a higher dose at this time.  He is working on weight loss.  He did gain weight over the past few months, but has started exercising and is being more active with weight loss.  Medications and allergies reviewed with patient and updated if appropriate.  Patient Active Problem List   Diagnosis Date Noted  . Carpal tunnel syndrome of left wrist 11/30/2018  . Anxiety 04/08/2018  . Chronic or recurrent subluxation of peroneal tendon of right foot 07/29/2017  . Hematuria 04/05/2017  . History of gout 10/22/2015  . Low testosterone 10/22/2015  . Mixed dyslipidemia 12/05/2014  . Obese     Current Outpatient Medications on File Prior to Visit  Medication Sig Dispense Refill  . allopurinol (ZYLOPRIM) 300 MG tablet Take 1 tablet (300 mg total) by mouth daily. 90 tablet 1  . cetirizine (ZYRTEC) 10 MG tablet Take 10 mg by mouth daily.    Marland Kitchen venlafaxine XR (EFFEXOR XR) 37.5 MG 24 hr capsule Take 1 capsule (37.5 mg total) by mouth daily with breakfast. 30 capsule 5   No current facility-administered medications on file prior to visit.     Past Medical History:  Diagnosis Date  . Allergy   . Asthma   . Gout   . Kidney stone   . Panic attacks    nocturnal, controls with self directed biofeedback    Past Surgical History:  Procedure Laterality Date  . NO PAST SURGERIES      Social History   Socioeconomic History  . Marital status: Married    Spouse name: Not on file  . Number of children: Not on file  . Years of education: Not on file  . Highest education level: Not on file  Occupational History  . Not on file  Social Needs  . Financial resource strain: Not on file   . Food insecurity    Worry: Not on file    Inability: Not on file  . Transportation needs    Medical: Not on file    Non-medical: Not on file  Tobacco Use  . Smoking status: Former Research scientist (life sciences)  . Smokeless tobacco: Former Systems developer    Quit date: 06/23/2011  Substance and Sexual Activity  . Alcohol use: Yes    Comment: occasional  . Drug use: No  . Sexual activity: Not on file  Lifestyle  . Physical activity    Days per week: Not on file    Minutes per session: Not on file  . Stress: Not on file  Relationships  . Social Herbalist on phone: Not on file    Gets together: Not on file    Attends religious service: Not on file    Active member of club or organization: Not on file    Attends meetings of clubs or organizations: Not on file    Relationship status: Not on file  Other Topics Concern  . Not on file  Social History Narrative   Married, lives with spouse   Forensic psychologist, works in Nurse, learning disability    Family History  Problem Relation Age  of Onset  . Breast cancer Mother   . Lung cancer Father   . Heart disease Father        CABG  . COPD Father   . Hypertension Other   . Stroke Other     Review of Systems  Constitutional: Negative for chills and fever.  Eyes: Negative for visual disturbance.  Respiratory: Negative for cough, shortness of breath and wheezing.   Cardiovascular: Negative for chest pain, palpitations and leg swelling.  Gastrointestinal: Negative for abdominal pain, blood in stool, constipation, diarrhea and nausea.       No gerd  Genitourinary: Positive for difficulty urinating (slower stream due to stricture). Negative for dysuria and hematuria.  Musculoskeletal: Negative for arthralgias and back pain.  Skin: Negative for color change and rash.  Neurological: Negative for light-headedness and headaches.  Psychiatric/Behavioral: Positive for dysphoric mood. The patient is not nervous/anxious.        Objective:   Vitals:   04/10/19 1449   BP: 140/80  Pulse: 84  Resp: 16  Temp: 98.2 F (36.8 C)  SpO2: 98%   Filed Weights   04/10/19 1449  Weight: 247 lb (112 kg)   Body mass index is 35.44 kg/m.  BP Readings from Last 3 Encounters:  04/10/19 140/80  02/06/19 132/86  11/30/18 118/70    Wt Readings from Last 3 Encounters:  04/10/19 247 lb (112 kg)  02/06/19 248 lb (112.5 kg)  11/30/18 239 lb (108.4 kg)     Physical Exam Constitutional: He appears well-developed and well-nourished. No distress.  HENT:  Head: Normocephalic and atraumatic.  Right Ear: External ear normal.  Left Ear: External ear normal.  Mouth/Throat: Oropharynx is clear and moist.  Normal ear canals and TM b/l  Eyes: Conjunctivae and EOM are normal.  Neck: Neck supple. No tracheal deviation present. No thyromegaly present.  No carotid bruit  Cardiovascular: Normal rate, regular rhythm, normal heart sounds and intact distal pulses.   No murmur heard. Pulmonary/Chest: Effort normal and breath sounds normal. No respiratory distress. He has no wheezes. He has no rales.  Abdominal: Soft. He exhibits no distension. There is no tenderness.  Genitourinary: deferred  Musculoskeletal: He exhibits no edema.  Lymphadenopathy:   He has no cervical adenopathy.  Skin: Skin is warm and dry. He is not diaphoretic.  Small lump subcutaneous tissue left mid quadrant-probable lipoma Psychiatric: He has a normal mood and affect. His behavior is normal.         Assessment & Plan:   Physical exam: Screening blood work  ordered Immunizations   Flu vaccine today, tdap up to date Exercise   regular Weight  Working on weight loss Substance abuse   none  See Problem List for Assessment and Plan of chronic medical problems.   Follow-up in 1 year, sooner if needed

## 2019-04-09 NOTE — Patient Instructions (Addendum)
Tests ordered today. Your results will be released to [place] (or called to you) after review.  If any changes need to be made, you will be notified at that same time.  All other Health Maintenance issues reviewed.   All recommended immunizations and age-appropriate screenings are up-to-date or discussed.  Flu immunization administered today.    Medications reviewed and updated.  Changes include :   Restarting clomid  Your prescription(s) have been submitted to your pharmacy. Please take as directed and contact our office if you believe you are having problem(s) with the medication(s).   Please followup in 1 year    Health Maintenance, Male Adopting a healthy lifestyle and getting preventive care are important in promoting health and wellness. Ask your health care provider about:  The right schedule for you to have regular tests and exams.  Things you can do on your own to prevent diseases and keep yourself healthy. What should I know about diet, weight, and exercise? Eat a healthy diet   Eat a diet that includes plenty of vegetables, fruits, low-fat dairy products, and lean protein.  Do not eat a lot of foods that are high in solid fats, added sugars, or sodium. Maintain a healthy weight Body mass index (BMI) is a measurement that can be used to identify possible weight problems. It estimates body fat based on height and weight. Your health care provider can help determine your BMI and help you achieve or maintain a healthy weight. Get regular exercise Get regular exercise. This is one of the most important things you can do for your health. Most adults should:  Exercise for at least 150 minutes each week. The exercise should increase your heart rate and make you sweat (moderate-intensity exercise).  Do strengthening exercises at least twice a week. This is in addition to the moderate-intensity exercise.  Spend less time sitting. Even light physical activity can be beneficial.  Watch cholesterol and blood lipids Have your blood tested for lipids and cholesterol at 35 years of age, then have this test every 5 years. You may need to have your cholesterol levels checked more often if:  Your lipid or cholesterol levels are high.  You are older than 35 years of age.  You are at high risk for heart disease. What should I know about cancer screening? Many types of cancers can be detected early and may often be prevented. Depending on your health history and family history, you may need to have cancer screening at various ages. This may include screening for:  Colorectal cancer.  Prostate cancer.  Skin cancer.  Lung cancer. What should I know about heart disease, diabetes, and high blood pressure? Blood pressure and heart disease  High blood pressure causes heart disease and increases the risk of stroke. This is more likely to develop in people who have high blood pressure readings, are of African descent, or are overweight.  Talk with your health care provider about your target blood pressure readings.  Have your blood pressure checked: ? Every 3-5 years if you are 91-39 years of age. ? Every year if you are 66 years old or older.  If you are between the ages of 65 and 71 and are a current or former smoker, ask your health care provider if you should have a one-time screening for abdominal aortic aneurysm (AAA). Diabetes Have regular diabetes screenings. This checks your fasting blood sugar level. Have the screening done:  Once every three years after age 25 if you  are at a normal weight and have a low risk for diabetes.  More often and at a younger age if you are overweight or have a high risk for diabetes. What should I know about preventing infection? Hepatitis B If you have a higher risk for hepatitis B, you should be screened for this virus. Talk with your health care provider to find out if you are at risk for hepatitis B infection. Hepatitis C  Blood testing is recommended for:  Everyone born from [birth year] through 1967.  Anyone with known risk factors for hepatitis C. Sexually transmitted infections (STIs)  You should be screened each year for STIs, including gonorrhea and chlamydia, if: ? You are sexually active and are younger than 35 years of age. ? You are older than 35 years of age and your health care provider tells you that you are at risk for this type of infection. ? Your sexual activity has changed since you were last screened, and you are at increased risk for chlamydia or gonorrhea. Ask your health care provider if you are at risk.  Ask your health care provider about whether you are at high risk for HIV. Your health care provider may recommend a prescription medicine to help prevent HIV infection. If you choose to take medicine to prevent HIV, you should first get tested for HIV. You should then be tested every 3 months for as long as you are taking the medicine. Follow these instructions at home: Lifestyle  Do not use any products that contain nicotine or tobacco, such as cigarettes, e-cigarettes, and chewing tobacco. If you need help quitting, ask your health care provider.  Do not use street drugs.  Do not share needles.  Ask your health care provider for help if you need support or information about quitting drugs. Alcohol use  Do not drink alcohol if your health care provider tells you not to drink.  If you drink alcohol: ? Limit how much you have to 0-2 drinks a day. ? Be aware of how much alcohol is in your drink. In the [place] one drink equals one 12 oz bottle of beer (355 mL), one 5 oz glass of wine (148 mL), or one 1 oz glass of hard liquor (44 mL). General instructions  Schedule regular health, dental, and eye exams.  Stay current with your vaccines.  Tell your health care provider if: ? You often feel depressed. ? You have ever been abused or do not feel safe at home. Summary  Adopting a  healthy lifestyle and getting preventive care are important in promoting health and wellness.  Follow your health care provider's instructions about healthy diet, exercising, and getting tested or screened for diseases.  Follow your health care provider's instructions on monitoring your cholesterol and blood pressure. This information is not intended to replace advice given to you by your health care provider. Make sure you discuss any questions you have with your health care provider. Document Released: 12/05/2007 Document Revised: 06/01/2018 Document Reviewed: 06/01/2018 Elsevier Patient Education  2020 [employer].

## 2019-04-10 ENCOUNTER — Ambulatory Visit (INDEPENDENT_AMBULATORY_CARE_PROVIDER_SITE_OTHER): Payer: 59 | Admitting: Internal Medicine

## 2019-04-10 ENCOUNTER — Other Ambulatory Visit: Payer: Self-pay

## 2019-04-10 ENCOUNTER — Encounter: Payer: Self-pay | Admitting: Internal Medicine

## 2019-04-10 VITALS — BP 140/80 | HR 84 | Temp 98.2°F | Resp 16 | Ht 70.0 in | Wt 247.0 lb

## 2019-04-10 DIAGNOSIS — F419 Anxiety disorder, unspecified: Secondary | ICD-10-CM | POA: Diagnosis not present

## 2019-04-10 DIAGNOSIS — E782 Mixed hyperlipidemia: Secondary | ICD-10-CM | POA: Diagnosis not present

## 2019-04-10 DIAGNOSIS — R7989 Other specified abnormal findings of blood chemistry: Secondary | ICD-10-CM | POA: Diagnosis not present

## 2019-04-10 DIAGNOSIS — Z634 Disappearance and death of family member: Secondary | ICD-10-CM

## 2019-04-10 DIAGNOSIS — E6609 Other obesity due to excess calories: Secondary | ICD-10-CM

## 2019-04-10 DIAGNOSIS — Z0000 Encounter for general adult medical examination without abnormal findings: Secondary | ICD-10-CM | POA: Diagnosis not present

## 2019-04-10 DIAGNOSIS — Z8739 Personal history of other diseases of the musculoskeletal system and connective tissue: Secondary | ICD-10-CM

## 2019-04-10 DIAGNOSIS — Z6835 Body mass index (BMI) 35.0-35.9, adult: Secondary | ICD-10-CM

## 2019-04-10 DIAGNOSIS — Z23 Encounter for immunization: Secondary | ICD-10-CM | POA: Diagnosis not present

## 2019-04-10 MED ORDER — CLOMIPHENE CITRATE 50 MG PO TABS: 50.0000 mg | ORAL_TABLET | ORAL | 0 refills | Status: DC

## 2019-04-10 MED FILL — CLOMIPHENE CITRATE 50 MG TA: 50 | 45 days supply | Qty: 13 | Fill #0

## 2019-04-10 NOTE — Assessment & Plan Note (Signed)
Check lipid panel, TSH, CMP Working on weight loss He has restarted exercising Working on changes in his diet

## 2019-04-10 NOTE — Assessment & Plan Note (Signed)
His  Father died in August and it was slightly unexpected - he did have lung cancer, but it was worse than he was aware Seeing a counselor Deferred increasing effexor at this time - he will let me know if he feels the dose needs to be increased

## 2019-04-10 NOTE — Assessment & Plan Note (Signed)
BMI 35.44 Has gout and hyperlipidemia He knows how important weight loss is and he is actively working on it He has started exercising and trying to make changes in his diet

## 2019-04-10 NOTE — Assessment & Plan Note (Signed)
Generalized anxiety-stable on Effexor 37.5 mg daily Will continue current dose. He is also dealing with his father's death we did discuss possibly increasing the Effexor to help him deal with this, but he does not feel this is necessary, but will let me know if he changes his mind He is seeing a Social worker.

## 2019-04-10 NOTE — Assessment & Plan Note (Signed)
He did see urology last year and was started on Clomid He just recently ran out and would like it renewed-advised I will renew it, but he needs to follow-up with urology Check CBC, CMP, testosterone levels

## 2019-04-10 NOTE — Assessment & Plan Note (Signed)
Taking allopurinol daily and doing well with it We will check uric acid level Continue allopurinol

## 2019-04-12 DIAGNOSIS — F4323 Adjustment disorder with mixed anxiety and depressed mood: Secondary | ICD-10-CM | POA: Diagnosis not present

## 2019-04-12 DIAGNOSIS — Z630 Problems in relationship with spouse or partner: Secondary | ICD-10-CM | POA: Diagnosis not present

## 2019-04-12 MED FILL — VENLAFAXINE HCL ER 37.5 MG: 37.5 | 30 days supply | Qty: 30 | Fill #2

## 2019-04-14 ENCOUNTER — Other Ambulatory Visit (INDEPENDENT_AMBULATORY_CARE_PROVIDER_SITE_OTHER): Payer: 59

## 2019-04-14 DIAGNOSIS — E782 Mixed hyperlipidemia: Secondary | ICD-10-CM | POA: Diagnosis not present

## 2019-04-14 DIAGNOSIS — Z8739 Personal history of other diseases of the musculoskeletal system and connective tissue: Secondary | ICD-10-CM

## 2019-04-14 DIAGNOSIS — Z0000 Encounter for general adult medical examination without abnormal findings: Secondary | ICD-10-CM | POA: Diagnosis not present

## 2019-04-14 DIAGNOSIS — R7989 Other specified abnormal findings of blood chemistry: Secondary | ICD-10-CM | POA: Diagnosis not present

## 2019-04-14 LAB — CBC WITH DIFFERENTIAL/PLATELET
Basophils Absolute: 0.1 10*3/uL (ref 0.0–0.1)
Basophils Relative: 1.4 % (ref 0.0–3.0)
Eosinophils Absolute: 0.1 10*3/uL (ref 0.0–0.7)
Eosinophils Relative: 2.7 % (ref 0.0–5.0)
HCT: 45.8 % (ref 39.0–52.0)
Hemoglobin: 15.9 g/dL (ref 13.0–17.0)
Lymphocytes Relative: 39.3 % (ref 12.0–46.0)
Lymphs Abs: 2.1 10*3/uL (ref 0.7–4.0)
MCHC: 34.7 g/dL (ref 30.0–36.0)
MCV: 87.6 fl (ref 78.0–100.0)
Monocytes Absolute: 0.4 10*3/uL (ref 0.1–1.0)
Monocytes Relative: 8 % (ref 3.0–12.0)
Neutro Abs: 2.6 10*3/uL (ref 1.4–7.7)
Neutrophils Relative %: 48.6 % (ref 43.0–77.0)
Platelets: 272 10*3/uL (ref 150.0–400.0)
RBC: 5.23 Mil/uL (ref 4.22–5.81)
RDW: 12.9 % (ref 11.5–15.5)
WBC: 5.4 10*3/uL (ref 4.0–10.5)

## 2019-04-14 LAB — COMPREHENSIVE METABOLIC PANEL
ALT: 37 U/L (ref 0–53)
AST: 22 U/L (ref 0–37)
Albumin: 4.6 g/dL (ref 3.5–5.2)
Alkaline Phosphatase: 44 U/L (ref 39–117)
BUN: 14 mg/dL (ref 6–23)
CO2: 26 mEq/L (ref 19–32)
Calcium: 9.2 mg/dL (ref 8.4–10.5)
Chloride: 104 mEq/L (ref 96–112)
Creatinine, Ser: 0.92 mg/dL (ref 0.40–1.50)
GFR: 93.36 mL/min (ref >60.00)
Glucose, Bld: 101 mg/dL — ABNORMAL HIGH (ref 70–99)
Potassium: 4 mEq/L (ref 3.5–5.1)
Sodium: 139 mEq/L (ref 135–145)
Total Bilirubin: 0.5 mg/dL (ref 0.2–1.2)
Total Protein: 7.4 g/dL (ref 6.0–8.3)

## 2019-04-14 LAB — LIPID PANEL
Cholesterol: 237 mg/dL — ABNORMAL HIGH (ref 0–200)
HDL: 29.7 mg/dL — ABNORMAL LOW (ref >39.00)
Total CHOL/HDL Ratio: 8
Triglycerides: 438 mg/dL — ABNORMAL HIGH (ref 0.0–149.0)

## 2019-04-14 LAB — LDL CHOLESTEROL, DIRECT: Direct LDL: 118 mg/dL

## 2019-04-14 LAB — TSH: TSH: 1.38 u[IU]/mL (ref 0.35–4.50)

## 2019-04-14 LAB — URIC ACID: Uric Acid, Serum: 6.1 mg/dL (ref 4.0–7.8)

## 2019-04-17 DIAGNOSIS — Z630 Problems in relationship with spouse or partner: Secondary | ICD-10-CM | POA: Diagnosis not present

## 2019-04-17 DIAGNOSIS — F4323 Adjustment disorder with mixed anxiety and depressed mood: Secondary | ICD-10-CM | POA: Diagnosis not present

## 2019-04-18 LAB — TESTOSTERONE, FREE & TOTAL
Free Testosterone: 59.7 pg/mL (ref 35.0–155.0)
Testosterone, Total, LC-MS-MS: 264 ng/dL (ref 250–1100)

## 2019-04-19 ENCOUNTER — Encounter: Payer: Self-pay | Admitting: Internal Medicine

## 2019-04-19 DIAGNOSIS — R739 Hyperglycemia, unspecified: Secondary | ICD-10-CM | POA: Insufficient documentation

## 2019-04-19 DIAGNOSIS — E782 Mixed hyperlipidemia: Secondary | ICD-10-CM

## 2019-04-19 MED ORDER — ROSUVASTATIN CALCIUM 10 MG PO TABS: 10.0000 mg | ORAL_TABLET | Freq: Every day | ORAL | 3 refills | Status: DC

## 2019-04-19 MED FILL — ROSUVASTATIN CALCIUM 10 MG: 10 | 90 days supply | Qty: 90 | Fill #0

## 2019-04-21 DIAGNOSIS — F4323 Adjustment disorder with mixed anxiety and depressed mood: Secondary | ICD-10-CM | POA: Diagnosis not present

## 2019-04-21 DIAGNOSIS — Z630 Problems in relationship with spouse or partner: Secondary | ICD-10-CM | POA: Diagnosis not present

## 2019-05-24 ENCOUNTER — Encounter: Payer: Self-pay | Admitting: Internal Medicine

## 2019-05-24 ENCOUNTER — Other Ambulatory Visit (INDEPENDENT_AMBULATORY_CARE_PROVIDER_SITE_OTHER): Payer: 59

## 2019-05-24 DIAGNOSIS — Z8739 Personal history of other diseases of the musculoskeletal system and connective tissue: Secondary | ICD-10-CM

## 2019-05-24 DIAGNOSIS — E782 Mixed hyperlipidemia: Secondary | ICD-10-CM

## 2019-05-24 DIAGNOSIS — Z0000 Encounter for general adult medical examination without abnormal findings: Secondary | ICD-10-CM

## 2019-05-24 LAB — LIPID PANEL
Cholesterol: 191 mg/dL (ref 0–200)
HDL: 29.5 mg/dL — ABNORMAL LOW (ref >39.00)
NonHDL: 161.57
Total CHOL/HDL Ratio: 6
Triglycerides: 212 mg/dL — ABNORMAL HIGH (ref 0.0–149.0)
VLDL: 42.4 mg/dL — ABNORMAL HIGH (ref 0.0–40.0)

## 2019-05-24 LAB — URIC ACID: Uric Acid, Serum: 6.2 mg/dL (ref 4.0–7.8)

## 2019-05-24 LAB — CBC WITH DIFFERENTIAL/PLATELET
Basophils Absolute: 0.1 10*3/uL (ref 0.0–0.1)
Basophils Relative: 0.9 % (ref 0.0–3.0)
Eosinophils Absolute: 0.1 10*3/uL (ref 0.0–0.7)
Eosinophils Relative: 0.9 % (ref 0.0–5.0)
HCT: 46.5 % (ref 39.0–52.0)
Hemoglobin: 16.3 g/dL (ref 13.0–17.0)
Lymphocytes Relative: 28.3 % (ref 12.0–46.0)
Lymphs Abs: 2 10*3/uL (ref 0.7–4.0)
MCHC: 35.1 g/dL (ref 30.0–36.0)
MCV: 87.3 fl (ref 78.0–100.0)
Monocytes Absolute: 0.6 10*3/uL (ref 0.1–1.0)
Monocytes Relative: 8.2 % (ref 3.0–12.0)
Neutro Abs: 4.4 10*3/uL (ref 1.4–7.7)
Neutrophils Relative %: 61.7 % (ref 43.0–77.0)
Platelets: 309 10*3/uL (ref 150.0–400.0)
RBC: 5.33 Mil/uL (ref 4.22–5.81)
RDW: 12.8 % (ref 11.5–15.5)
WBC: 7.1 10*3/uL (ref 4.0–10.5)

## 2019-05-24 LAB — COMPREHENSIVE METABOLIC PANEL
ALT: 24 U/L (ref 0–53)
AST: 20 U/L (ref 0–37)
Albumin: 4.8 g/dL (ref 3.5–5.2)
Alkaline Phosphatase: 46 U/L (ref 39–117)
BUN: 13 mg/dL (ref 6–23)
CO2: 26 mEq/L (ref 19–32)
Calcium: 9.6 mg/dL (ref 8.4–10.5)
Chloride: 104 mEq/L (ref 96–112)
Creatinine, Ser: 1.01 mg/dL (ref 0.40–1.50)
GFR: 83.77 mL/min (ref >60.00)
Glucose, Bld: 99 mg/dL (ref 70–99)
Potassium: 3.8 mEq/L (ref 3.5–5.1)
Sodium: 140 mEq/L (ref 135–145)
Total Bilirubin: 0.9 mg/dL (ref 0.2–1.2)
Total Protein: 7.7 g/dL (ref 6.0–8.3)

## 2019-05-24 LAB — HEPATIC FUNCTION PANEL
ALT: 24 U/L (ref 0–53)
AST: 20 U/L (ref 0–37)
Albumin: 4.8 g/dL (ref 3.5–5.2)
Alkaline Phosphatase: 46 U/L (ref 39–117)
Bilirubin, Direct: 0.1 mg/dL (ref 0.0–0.3)
Total Bilirubin: 0.9 mg/dL (ref 0.2–1.2)
Total Protein: 7.7 g/dL (ref 6.0–8.3)

## 2019-05-24 LAB — LDL CHOLESTEROL, DIRECT: Direct LDL: 142 mg/dL

## 2019-05-24 LAB — TSH: TSH: 1.86 u[IU]/mL (ref 0.35–4.50)

## 2019-05-25 ENCOUNTER — Encounter: Payer: Self-pay | Admitting: Internal Medicine

## 2019-05-25 MED ORDER — CLOMIPHENE CITRATE 50 MG PO TABS: 50.0000 mg | ORAL_TABLET | ORAL | 2 refills | Status: DC

## 2019-05-25 MED FILL — CLOMIPHENE CITRATE 50 MG TA: 50 | 45 days supply | Qty: 13 | Fill #0

## 2019-06-14 DIAGNOSIS — Z630 Problems in relationship with spouse or partner: Secondary | ICD-10-CM | POA: Diagnosis not present

## 2019-06-14 DIAGNOSIS — F4323 Adjustment disorder with mixed anxiety and depressed mood: Secondary | ICD-10-CM | POA: Diagnosis not present

## 2019-06-26 ENCOUNTER — Other Ambulatory Visit: Payer: Self-pay | Admitting: Family Medicine

## 2019-06-26 MED FILL — VENLAFAXINE HCL ER 37.5 MG: 37.5 | 30 days supply | Qty: 30 | Fill #4

## 2019-06-27 MED FILL — ALLOPURINOL 300 MG TABS: 300 | 90 days supply | Qty: 90 | Fill #0

## 2019-07-05 DIAGNOSIS — F4323 Adjustment disorder with mixed anxiety and depressed mood: Secondary | ICD-10-CM | POA: Diagnosis not present

## 2019-07-05 DIAGNOSIS — Z630 Problems in relationship with spouse or partner: Secondary | ICD-10-CM | POA: Diagnosis not present

## 2019-07-12 DIAGNOSIS — Z630 Problems in relationship with spouse or partner: Secondary | ICD-10-CM | POA: Diagnosis not present

## 2019-07-12 DIAGNOSIS — F4323 Adjustment disorder with mixed anxiety and depressed mood: Secondary | ICD-10-CM | POA: Diagnosis not present

## 2019-07-25 MED FILL — VENLAFAXINE HCL ER 37.5 MG: 37.5 | 30 days supply | Qty: 30 | Fill #5

## 2019-07-25 MED FILL — ROSUVASTATIN CALCIUM 10 MG: 10 | 90 days supply | Qty: 90 | Fill #1

## 2019-08-02 DIAGNOSIS — F4323 Adjustment disorder with mixed anxiety and depressed mood: Secondary | ICD-10-CM | POA: Diagnosis not present

## 2019-08-02 DIAGNOSIS — Z630 Problems in relationship with spouse or partner: Secondary | ICD-10-CM | POA: Diagnosis not present

## 2019-08-28 ENCOUNTER — Other Ambulatory Visit: Payer: Self-pay | Admitting: Family Medicine

## 2019-08-28 MED FILL — VENLAFAXINE HCL ER 37.5 MG: 37.5 | 30 days supply | Qty: 30 | Fill #0

## 2019-08-30 DIAGNOSIS — Z630 Problems in relationship with spouse or partner: Secondary | ICD-10-CM | POA: Diagnosis not present

## 2019-08-30 DIAGNOSIS — F4323 Adjustment disorder with mixed anxiety and depressed mood: Secondary | ICD-10-CM | POA: Diagnosis not present

## 2019-09-27 MED FILL — VENLAFAXINE HCL ER 37.5 MG: 37.5 | 30 days supply | Qty: 30 | Fill #1

## 2019-10-18 MED FILL — ROSUVASTATIN CALCIUM 10 MG: 10 | 90 days supply | Qty: 90 | Fill #2

## 2019-10-18 MED FILL — ALLOPURINOL 300 MG TABS: 300 | 90 days supply | Qty: 90 | Fill #1

## 2019-10-23 MED FILL — VENLAFAXINE HCL ER 37.5 MG: 37.5 | 30 days supply | Qty: 30 | Fill #2

## 2019-12-13 MED FILL — VENLAFAXINE HCL ER 37.5 MG: 37.5 | 30 days supply | Qty: 30 | Fill #0

## 2020-01-20 MED FILL — VENLAFAXINE HCL ER 37.5 MG: 37.5 | 30 days supply | Qty: 30 | Fill #1

## 2020-03-19 MED FILL — VENLAFAXINE HCL ER 37.5 MG: 37.5 | 30 days supply | Qty: 30 | Fill #2

## 2020-04-10 ENCOUNTER — Encounter: Payer: 59 | Admitting: Internal Medicine

## 2020-12-11 ENCOUNTER — Telehealth: Payer: Self-pay | Admitting: Physician Assistant

## 2020-12-11 DIAGNOSIS — R0982 Postnasal drip: Secondary | ICD-10-CM

## 2020-12-12 ENCOUNTER — Other Ambulatory Visit (HOSPITAL_COMMUNITY): Payer: Self-pay

## 2020-12-12 MED ORDER — LEVOCETIRIZINE DIHYDROCHLORIDE 5 MG PO TABS
5.0000 mg | ORAL_TABLET | Freq: Every evening | ORAL | 0 refills | Status: DC
  Filled 2020-12-12: qty 30, 30d supply, fill #0

## 2020-12-12 MED ORDER — FLUTICASONE PROPIONATE 50 MCG/ACT NA SUSP
2.0000 | Freq: Every day | NASAL | 0 refills | Status: DC
  Filled 2020-12-12: qty 16, 30d supply, fill #0

## 2020-12-12 MED ORDER — IPRATROPIUM BROMIDE 0.06 % NA SOLN
2.0000 | Freq: Two times a day (BID) | NASAL | 12 refills | Status: DC
  Filled 2020-12-12: qty 15, 20d supply, fill #0

## 2020-12-12 NOTE — Progress Notes (Signed)
E visit for Allergic Rhinitis We are sorry that you are not feeling well.  Here is how we plan to help!  Based on what you have shared with me it looks like you have Allergic Rhinitis.  Rhinitis is when a reaction occurs that causes nasal congestion, runny nose, sneezing, and itching.  Most types of rhinitis are caused by an inflammation and are associated with symptoms in the eyes ears or throat. There are several types of rhinitis.  The most common are acute rhinitis, which is usually caused by a viral illness, allergic or seasonal rhinitis, and nonallergic or year-round rhinitis.  Nasal allergies occur certain times of the year.  Allergic rhinitis is caused when allergens in the air trigger the release of histamine in the body.  Histamine causes itching, swelling, and fluid to build up in the fragile linings of the nasal passages, sinuses and eyelids.  An itchy nose and clear discharge are common.  I recommend the following over the counter treatments: Xyzal 5 mg take 1 tablet daily  I also would recommend a nasal spray: Flonase 2 sprays into each nostril once daily And Ipratropium Bromide nasal spray 2 sprays into each nostril twice daily  HOME CARE:  You can use an over-the-counter saline nasal spray as needed Avoid areas where there is heavy dust, mites, or molds Stay indoors on windy days during the pollen season Keep windows closed in home, at least in bedroom; use air conditioner. Use high-efficiency house air filter Keep windows closed in car, turn AC on re-circulate Avoid playing out with dog during pollen season  GET HELP RIGHT AWAY IF:  If your symptoms do not improve within 10 days You become short of breath You develop yellow or green discharge from your nose for over 3 days You have coughing fits  MAKE SURE YOU:  Understand these instructions Will watch your condition Will get help right away if you are not doing well or get worse  Thank you for choosing an  e-visit. Your e-visit answers were reviewed by a board certified advanced clinical practitioner to complete your personal care plan. Depending upon the condition, your plan could have included both over the counter or prescription medications. Please review your pharmacy choice. Be sure that the pharmacy you have chosen is open so that you can pick up your prescription now.  If there is a problem you may message your provider in MyChart to have the prescription routed to another pharmacy. Your safety is important to [place]. If you have drug allergies check your prescription carefully.  For the next 24 hours, you can use MyChart to ask questions about today's visit, request a non-urgent call back, or ask for a work or school excuse from your e-visit provider. You will get an email in the next two days asking about your experience. I hope that your e-visit has been valuable and will speed your recovery.  I provided 6 minutes of non face-to-face time during this encounter for chart review and documentation.

## 2020-12-20 ENCOUNTER — Other Ambulatory Visit (HOSPITAL_COMMUNITY): Payer: Self-pay

## 2021-02-14 ENCOUNTER — Other Ambulatory Visit: Payer: Self-pay | Admitting: Occupational Medicine

## 2021-02-14 ENCOUNTER — Ambulatory Visit: Payer: Self-pay

## 2021-02-14 ENCOUNTER — Other Ambulatory Visit: Payer: Self-pay

## 2021-02-14 DIAGNOSIS — Z0000 Encounter for general adult medical examination without abnormal findings: Secondary | ICD-10-CM

## 2021-08-12 ENCOUNTER — Encounter (HOSPITAL_COMMUNITY): Payer: Self-pay

## 2021-08-12 ENCOUNTER — Ambulatory Visit (HOSPITAL_COMMUNITY)
Admission: EM | Admit: 2021-08-12 | Discharge: 2021-08-12 | Disposition: A | Discharge status: Home / Self Care | Payer: 59 | Attending: Internal Medicine | Admitting: Internal Medicine

## 2021-08-12 ENCOUNTER — Other Ambulatory Visit: Payer: Self-pay

## 2021-08-12 DIAGNOSIS — J039 Acute tonsillitis, unspecified: Secondary | ICD-10-CM | POA: Insufficient documentation

## 2021-08-12 LAB — POCT RAPID STREP A, ED / UC: Streptococcus, Group A Screen (Direct): NEGATIVE

## 2021-08-12 MED ORDER — ACETAMINOPHEN 325 MG PO TABS
ORAL_TABLET | ORAL | Status: AC
  Filled 2021-08-12: qty 2

## 2021-08-12 MED ORDER — ACETAMINOPHEN 325 MG PO TABS
650.0000 mg | ORAL_TABLET | Freq: Once | ORAL | Status: AC
  Administered 2021-08-12: 650 mg via ORAL

## 2021-08-12 MED ORDER — AZITHROMYCIN 500 MG PO TABS: 500.0000 mg | ORAL_TABLET | Freq: Every day | ORAL | 0 refills | Status: AC

## 2021-08-12 NOTE — ED Provider Notes (Signed)
[facility] [facility]    CSN: [id number] Arrival date & time: 08/12/21  1938      History   Chief Complaint Chief Complaint  Patient presents with   Sore Throat    HPI Michael Hart is a 36 y.o. male.   Patient reports with cough and sore throat that started yesterday.  Reports his children are recently treated for strep throat and one was treated for an ear infection.  Endorses fever, mostly dry cough, nasal congestion, runny nose, postnasal drip, swollen glands, sore throat.  Denies shortness of breath, wheezing, chest pain or tightness, sneezing, sinus pressure, tooth pain, nausea vomiting or diarrhea, and change in appetite.  He denies any new rash and denies any fatigue.  He has taken Mucinex and Excedrin for his symptoms without relief.    Past Medical History:  Diagnosis Date   Allergy    Asthma    Gout    Kidney stone    Panic attacks    nocturnal, controls with self directed biofeedback    Patient Active Problem List   Diagnosis Date Noted   Hyperglycemia 04/19/2019   Bereavement 04/10/2019   Carpal tunnel syndrome of left wrist 11/30/2018   Anxiety 04/08/2018   Chronic or recurrent subluxation of peroneal tendon of right foot 07/29/2017   Hematuria 04/05/2017   History of gout 10/22/2015   Low testosterone 10/22/2015   Mixed dyslipidemia 12/05/2014   Obese     Past Surgical History:  Procedure Laterality Date   NO PAST SURGERIES         Home Medications    Prior to Admission medications   Medication Sig Start Date End Date Taking? Authorizing Provider  azithromycin (ZITHROMAX) 500 MG tablet Take 1 tablet (500 mg total) by mouth daily for 5 days. Take first 2 tablets together, then 1 every day until finished. 08/12/21 08/17/21 Yes Noemi Chapel A, NP  allopurinol (ZYLOPRIM) 300 MG tablet TAKE 1 TABLET BY MOUTH DAILY. 06/27/19   Lyndal Pulley, DO  cetirizine (ZYRTEC) 10 MG tablet Take 10 mg by mouth daily.    [provider]   fluticasone (FLONASE) 50 MCG/ACT nasal spray Place 2 sprays into both nostrils daily. 12/12/20   Mar Daring, PA-C  ipratropium (ATROVENT) 0.06 % nasal spray Place 2 sprays into both nostrils in the morning and at bedtime. 12/12/20   Mar Daring, PA-C  levocetirizine (XYZAL) 5 MG tablet Take 1 tablet (5 mg total) by mouth every evening. 12/12/20   Mar Daring, PA-C  rosuvastatin (CRESTOR) 10 MG tablet Take 1 tablet (10 mg total) by mouth daily. 04/19/19   Binnie Rail, MD  venlafaxine XR (EFFEXOR-XR) 37.5 MG 24 hr capsule TAKE 1 CAPSULE (37.5 MG TOTAL) BY MOUTH DAILY WITH BREAKFAST. 08/28/19   Lyndal Pulley, DO    Family History Family History  Problem Relation Age of Onset   Breast cancer Mother    Lung cancer Father    Heart disease Father        CABG   COPD Father    Hypertension Other    Stroke Other     Social History Social History   Tobacco Use   Smoking status: Former   Smokeless tobacco: Former    Quit date: 06/23/2011  Substance Use Topics   Alcohol use: Yes    Comment: occasional   Drug use: No     Allergies   Penicillins and Sulfa antibiotics   Review of Systems Review of  Systems Per HPI  Physical Exam Triage Vital Signs ED Triage Vitals [08/12/21 1954]  Enc Vitals Group     BP 137/76     Pulse Rate (!) 131     Resp 18     Temp (!) 102.3 F (39.1 C)     Temp Source Oral     SpO2 96 %     Weight      Height      Head Circumference      Peak Flow      Pain Score 5     Pain Loc      Pain Edu?      Excl. in [place]?    No data found.  Updated Vital Signs BP 137/76 (BP Location: Left Arm)    Pulse (!) 131    Temp (!) 102.3 F (39.1 C) (Oral)    Resp 18    SpO2 96%   Visual Acuity Right Eye Distance:   Left Eye Distance:   Bilateral Distance:    Right Eye Near:   Left Eye Near:    Bilateral Near:     Physical Exam Vitals and nursing note reviewed.  Constitutional:      General: He is not in acute distress.     Appearance: He is well-developed. He is not toxic-appearing.  HENT:     Head: Normocephalic and atraumatic.     Right Ear: Ear canal normal. Tympanic membrane is not erythematous.     Left Ear: Ear canal normal. Tympanic membrane is not erythematous.     Nose: Congestion and rhinorrhea present.     Mouth/Throat:     Pharynx: Uvula midline. Posterior oropharyngeal erythema present. No uvula swelling.     Tonsils: Tonsillar exudate present. 2+ on the right. 2+ on the left.  Eyes:     Extraocular Movements:     Right eye: Normal extraocular motion.     Left eye: Normal extraocular motion.  Cardiovascular:     Rate and Rhythm: Tachycardia present.  Pulmonary:     Effort: Pulmonary effort is normal. No respiratory distress.     Breath sounds: Normal breath sounds. No wheezing, rhonchi or rales.  Abdominal:     General: Bowel sounds are normal.     Palpations: Abdomen is soft.  Musculoskeletal:     Cervical back: Normal range of motion and neck supple.  Lymphadenopathy:     Cervical: No cervical adenopathy.  Skin:    General: Skin is warm and dry.     Capillary Refill: Capillary refill takes less than 2 seconds.     Coloration: Skin is not pale.     Findings: No erythema or rash.  Neurological:     Mental Status: He is alert and oriented to person, place, and time.     UC Treatments / Results  Labs (all labs ordered are listed, but only abnormal results are displayed) Labs Reviewed  CULTURE, GROUP A STREP [facility])  POCT RAPID STREP A, ED / UC    EKG   Radiology No results found.  Procedures Procedures (including critical care time)  Medications Ordered in UC Medications  acetaminophen (TYLENOL) tablet 650 mg (650 mg Oral Given 08/12/21 2003)    Initial Impression / Assessment and Plan / UC Course  I have reviewed the triage vital signs and the nursing notes.  Pertinent labs & imaging results that were available during my care of the patient were reviewed by me and  considered in my medical decision  making (see chart for details).     Symptoms are consistent with tonsillitis, rapid strep test today in office negative, however given positive exposure, will empirically treat patient while throat culture is pending.  Treat with azithromycin 500 mg for 5 days given history of allergic reaction to penicillin.  Patient tachycardic today, probably related to fever.  Tylenol given in clinic today.  Encourage patient to continue alternating Tylenol and ibuprofen with fever symptoms.  Can continue Mucinex and Excedrin as needed.  Push hydration, can continue Neti pot.  Follow-up with primary care if symptoms or not improving.  If symptoms worsen, go to emergency room.  Note given for work. Final Clinical Impressions(s) / UC Diagnoses   Final diagnoses:  Tonsillitis     Discharge Instructions      Please take all of the antibiotic.  If you are not feeling better after the antibiotic, please follow up with your PCP. Some things to help you feel better are: - Increased rest - Increasing fluid with water/sugar free electrolytes - Acetaminophen as needed for fever/pain.  - Salt water gargling, chloraseptic spray and throat lozenges - OTC guaifenesin (Mucinex).  - Saline sinus flushes or a neti pot.  - Humidifying the air.      ED Prescriptions     Medication Sig Dispense Auth. Provider   azithromycin (ZITHROMAX) 500 MG tablet Take 1 tablet (500 mg total) by mouth daily for 5 days. Take first 2 tablets together, then 1 every day until finished. 5 tablet Eulogio Bear, NP      PDMP not reviewed this encounter.   Eulogio Bear, NP 08/12/21 2021

## 2021-08-12 NOTE — ED Triage Notes (Signed)
Pt c/o sore throat and fever since yesterday. States his kids have strep.

## 2021-08-12 NOTE — Discharge Instructions (Addendum)
Please take all of the antibiotic.  If you are not feeling better after the antibiotic, please follow up with your PCP. Some things to help you feel better are: - Increased rest - Increasing fluid with water/sugar free electrolytes - Acetaminophen as needed for fever/pain.  - Salt water gargling, chloraseptic spray and throat lozenges - OTC guaifenesin (Mucinex).  - Saline sinus flushes or a neti pot.  - Humidifying the air.

## 2021-08-15 LAB — CULTURE, GROUP A STREP (THRC)

## 2021-11-12 ENCOUNTER — Emergency Department (HOSPITAL_BASED_OUTPATIENT_CLINIC_OR_DEPARTMENT_OTHER)
Admission: EM | Admit: 2021-11-12 | Discharge: 2021-11-13 | Disposition: A | Discharge status: Home / Self Care | Payer: 59 | Attending: Emergency Medicine | Admitting: Emergency Medicine

## 2021-11-12 ENCOUNTER — Emergency Department (HOSPITAL_BASED_OUTPATIENT_CLINIC_OR_DEPARTMENT_OTHER): Payer: 59

## 2021-11-12 ENCOUNTER — Other Ambulatory Visit: Payer: Self-pay

## 2021-11-12 DIAGNOSIS — Z7951 Long term (current) use of inhaled steroids: Secondary | ICD-10-CM | POA: Diagnosis not present

## 2021-11-12 DIAGNOSIS — Z20822 Contact with and (suspected) exposure to covid-19: Secondary | ICD-10-CM | POA: Insufficient documentation

## 2021-11-12 DIAGNOSIS — R519 Headache, unspecified: Secondary | ICD-10-CM | POA: Diagnosis present

## 2021-11-12 DIAGNOSIS — E871 Hypo-osmolality and hyponatremia: Secondary | ICD-10-CM | POA: Diagnosis not present

## 2021-11-12 DIAGNOSIS — J45909 Unspecified asthma, uncomplicated: Secondary | ICD-10-CM | POA: Insufficient documentation

## 2021-11-12 DIAGNOSIS — R718 Other abnormality of red blood cells: Secondary | ICD-10-CM | POA: Diagnosis not present

## 2021-11-12 DIAGNOSIS — E878 Other disorders of electrolyte and fluid balance, not elsewhere classified: Secondary | ICD-10-CM | POA: Diagnosis not present

## 2021-11-12 DIAGNOSIS — R779 Abnormality of plasma protein, unspecified: Secondary | ICD-10-CM | POA: Diagnosis not present

## 2021-11-12 DIAGNOSIS — R030 Elevated blood-pressure reading, without diagnosis of hypertension: Secondary | ICD-10-CM

## 2021-11-12 DIAGNOSIS — H53149 Visual discomfort, unspecified: Secondary | ICD-10-CM | POA: Insufficient documentation

## 2021-11-12 DIAGNOSIS — R112 Nausea with vomiting, unspecified: Secondary | ICD-10-CM | POA: Insufficient documentation

## 2021-11-12 DIAGNOSIS — I10 Essential (primary) hypertension: Secondary | ICD-10-CM | POA: Insufficient documentation

## 2021-11-12 LAB — URINALYSIS, ROUTINE W REFLEX MICROSCOPIC
Bilirubin Urine: NEGATIVE
Glucose, UA: NEGATIVE mg/dL
Hgb urine dipstick: NEGATIVE
Leukocytes,Ua: NEGATIVE
Nitrite: NEGATIVE
Protein, ur: NEGATIVE mg/dL
Specific Gravity, Urine: 1.021 (ref 1.005–1.030)
pH: 5.5 (ref 5.0–8.0)

## 2021-11-12 LAB — COMPREHENSIVE METABOLIC PANEL
ALT: 27 U/L (ref 0–44)
AST: 24 U/L (ref 15–41)
Albumin: 4.9 g/dL (ref 3.5–5.0)
Alkaline Phosphatase: 38 U/L (ref 38–126)
Anion gap: 13 (ref 5–15)
BUN: 13 mg/dL (ref 6–20)
CO2: 25 mmol/L (ref 22–32)
Calcium: 9.8 mg/dL (ref 8.9–10.3)
Chloride: 96 mmol/L — ABNORMAL LOW (ref 98–111)
Creatinine, Ser: 0.99 mg/dL (ref 0.61–1.24)
GFR, Estimated: >60 mL/min (ref >60)
Glucose, Bld: 94 mg/dL (ref 70–99)
Potassium: 4.4 mmol/L (ref 3.5–5.1)
Sodium: 134 mmol/L — ABNORMAL LOW (ref 135–145)
Total Bilirubin: 0.7 mg/dL (ref 0.3–1.2)
Total Protein: 8.3 g/dL — ABNORMAL HIGH (ref 6.5–8.1)

## 2021-11-12 LAB — CBC WITH DIFFERENTIAL/PLATELET
Abs Immature Granulocytes: 0.02 10*3/uL (ref 0.00–0.07)
Basophils Absolute: 0.1 10*3/uL (ref 0.0–0.1)
Basophils Relative: 1 %
Eosinophils Absolute: 0 10*3/uL (ref 0.0–0.5)
Eosinophils Relative: 1 %
HCT: 47.7 % (ref 39.0–52.0)
Hemoglobin: 17.1 g/dL — ABNORMAL HIGH (ref 13.0–17.0)
Immature Granulocytes: 0 %
Lymphocytes Relative: 22 %
Lymphs Abs: 1.8 10*3/uL (ref 0.7–4.0)
MCH: 30.5 pg (ref 26.0–34.0)
MCHC: 35.8 g/dL (ref 30.0–36.0)
MCV: 85.2 fL (ref 80.0–100.0)
Monocytes Absolute: 0.9 10*3/uL (ref 0.1–1.0)
Monocytes Relative: 11 %
Neutro Abs: 5.5 10*3/uL (ref 1.7–7.7)
Neutrophils Relative %: 65 %
Platelets: 271 10*3/uL (ref 150–400)
RBC: 5.6 MIL/uL (ref 4.22–5.81)
RDW: 12 % (ref 11.5–15.5)
WBC: 8.4 10*3/uL (ref 4.0–10.5)
nRBC: 0 % (ref 0.0–0.2)

## 2021-11-12 LAB — RESP PANEL BY RT-PCR (FLU A&B, COVID) ARPGX2
Influenza A by PCR: NEGATIVE
Influenza B by PCR: NEGATIVE
SARS Coronavirus 2 by RT PCR: NEGATIVE

## 2021-11-12 LAB — LIPASE, BLOOD: Lipase: 12 U/L (ref 11–51)

## 2021-11-12 LAB — TROPONIN I (HIGH SENSITIVITY): Troponin I (High Sensitivity): 3 ng/L (ref <18)

## 2021-11-12 MED ORDER — KETOROLAC TROMETHAMINE 15 MG/ML IJ SOLN
15.0000 mg | Freq: Once | INTRAMUSCULAR | Status: AC
  Administered 2021-11-12: 15 mg via INTRAVENOUS
  Filled 2021-11-12: qty 1

## 2021-11-12 MED ORDER — AMLODIPINE BESYLATE 5 MG PO TABS
5.0000 mg | ORAL_TABLET | Freq: Every day | ORAL | 1 refills | Status: DC
  Filled 2021-11-12: qty 30, 30d supply, fill #0

## 2021-11-12 MED ORDER — DEXAMETHASONE SODIUM PHOSPHATE 10 MG/ML IJ SOLN
10.0000 mg | Freq: Once | INTRAMUSCULAR | Status: AC
  Administered 2021-11-12: 10 mg via INTRAVENOUS
  Filled 2021-11-12: qty 1

## 2021-11-12 MED ORDER — METOCLOPRAMIDE HCL 5 MG/ML IJ SOLN
10.0000 mg | Freq: Once | INTRAMUSCULAR | Status: AC
  Administered 2021-11-12: 10 mg via INTRAVENOUS
  Filled 2021-11-12: qty 2

## 2021-11-12 MED ORDER — LACTATED RINGERS IV BOLUS
1000.0000 mL | Freq: Once | INTRAVENOUS | Status: AC
  Administered 2021-11-12: 1000 mL via INTRAVENOUS

## 2021-11-12 NOTE — ED Notes (Signed)
Took BP 3 times, systolic and diastolic was constant 172/111, 176/111 and 176/109

## 2021-11-12 NOTE — ED Triage Notes (Signed)
Pt c/o nausea, vomiting, headache x 2 days. States he chaperoned his son to the zoo and felt sick the day after. Denies diarrhea, last BM 2 days ago.

## 2021-11-12 NOTE — ED Provider Notes (Signed)
[place] EMERGENCY DEPT Provider Note   CSN: [id number] Arrival date & time: 11/12/21  1414     History Chief Complaint  Patient presents with   Emesis   Headache    Michael Hart is a 36 y.o. male with h/o asthma presents to the ED for evaluation of headache for the past 3-4 days.  The patient reported his headache started on Sunday.  It was worse on Monday 6 and he left for work.  He has been taking Tylenol and ibuprofen with minimal relief.  The next day on Tuesday morning he noticed that he had some problems with his speech whenever he was talking with his dog.  He reports he called his mom for her to evaluate the speech.  He reported that this may have lasted for around 2 hours although he was by himself did not have anyone else to talk to at home.  Additionally, he reports he has some lightheadedness with standing otherwise normal.  Denies any trouble walking or any other episodes of trouble talking.  He does have some nausea and for the past 2 days has had 4 episodes of vomiting.  He denies any abdominal pain, constipation, diarrhea, chest pain, shortness breath, blurry vision, weakness, or dizziness.  He does have some photophobia.  Denies any neck pain, body aches, or fever.  Denies any syncope.  He reports that today he tried some Excedrin Migraine that actually helped him as the medication wore off his headache returned.  He denies any trauma, falls, or MVC's.   Emesis Associated symptoms: headaches   Associated symptoms: no abdominal pain, no chills, no diarrhea, no fever and no sore throat   Headache Associated symptoms: photophobia and vomiting   Associated symptoms: no abdominal pain, no back pain, no congestion, no diarrhea, no dizziness, no fever, no nausea, no neck pain, no sore throat and no weakness       Home Medications Prior to Admission medications   Medication Sig Start Date End Date Taking? Authorizing Provider  allopurinol (ZYLOPRIM) 300 MG  tablet TAKE 1 TABLET BY MOUTH DAILY. 06/27/19   Lyndal Pulley, DO  cetirizine (ZYRTEC) 10 MG tablet Take 10 mg by mouth daily.    [provider]  fluticasone (FLONASE) 50 MCG/ACT nasal spray Place 2 sprays into both nostrils daily. 12/12/20   Mar Daring, PA-C  ipratropium (ATROVENT) 0.06 % nasal spray Place 2 sprays into both nostrils in the morning and at bedtime. 12/12/20   Mar Daring, PA-C  levocetirizine (XYZAL) 5 MG tablet Take 1 tablet (5 mg total) by mouth every evening. 12/12/20   Mar Daring, PA-C  rosuvastatin (CRESTOR) 10 MG tablet Take 1 tablet (10 mg total) by mouth daily. 04/19/19   Binnie Rail, MD  venlafaxine XR (EFFEXOR-XR) 37.5 MG 24 hr capsule TAKE 1 CAPSULE (37.5 MG TOTAL) BY MOUTH DAILY WITH BREAKFAST. 08/28/19   Lyndal Pulley, DO      Allergies    Penicillins and Sulfa antibiotics    Review of Systems   Review of Systems  Constitutional:  Negative for chills and fever.  HENT:  Negative for congestion, rhinorrhea and sore throat.   Eyes:  Positive for photophobia. Negative for visual disturbance.  Respiratory:  Negative for shortness of breath.   Cardiovascular:  Negative for chest pain.  Gastrointestinal:  Positive for vomiting. Negative for abdominal pain, constipation, diarrhea and nausea.  Musculoskeletal:  Negative for back pain and neck pain.  Neurological:  Positive for speech difficulty and headaches. Negative for dizziness, syncope, facial asymmetry, weakness and light-headedness.   Physical Exam Updated Vital Signs BP (!) 176/111 (BP Location: Right Arm)   Pulse 73   Temp 98.5 F (36.9 C) (Oral)   Resp 18   Ht 5\' 10"  (1.778 m)   Wt 111.1 kg   SpO2 98%   BMI 35.15 kg/m  Physical Exam Vitals and nursing note reviewed.  Constitutional:      General: He is not in acute distress.    Appearance: Normal appearance. He is not ill-appearing or toxic-appearing.  HENT:     Head: Normocephalic and atraumatic.      Right Ear: Tympanic membrane, ear canal and external ear normal.     Left Ear: Tympanic membrane, ear canal and external ear normal.     Nose: Nose normal.     Mouth/Throat:     Mouth: Mucous membranes are moist.  Eyes:     General: No scleral icterus.    Extraocular Movements: Extraocular movements intact.     Pupils: Pupils are equal, round, and reactive to light.  Neck:     Comments: Full range of motion without any pain.  No midline or paraspinal tenderness palpation.  No cervical lymphadenopathy palpated. Cardiovascular:     Rate and Rhythm: Normal rate and regular rhythm.  Pulmonary:     Effort: Pulmonary effort is normal. No respiratory distress.     Breath sounds: Normal breath sounds.     Comments: Clear to auscultation bilaterally.  No respiratory distress, accessory muscle use, nasal pain, cyanosis, or tripoding present.  Patient is satting well on room air without any increased work of breathing. Abdominal:     General: Abdomen is flat. Bowel sounds are normal.     Palpations: Abdomen is soft.     Tenderness: There is no abdominal tenderness. There is no guarding.  Musculoskeletal:        General: No deformity.     Cervical back: Normal range of motion. No rigidity.  Lymphadenopathy:     Cervical: No cervical adenopathy.  Skin:    General: Skin is warm and dry.  Neurological:     General: No focal deficit present.     Mental Status: He is alert. Mental status is at baseline.     GCS: GCS eye subscore is 4. GCS verbal subscore is 5. GCS motor subscore is 6.     Cranial Nerves: Cranial nerves 2-12 are intact. No dysarthria or facial asymmetry.     Sensory: No sensory deficit.     Motor: Motor function is intact. No pronator drift.     Coordination: Finger-Nose-Finger Test normal.     Comments: Patient is alert and oriented.  GCS 15.  He is answering questions appropriately with appropriate speech.  His cranial nerves II through XII are intact.  No facial asymmetry noted.   Sensation intact throughout.  He has no pronator drift.  Normal finger-to-nose.    ED Results / Procedures / Treatments   Labs (all labs ordered are listed, but only abnormal results are displayed) Labs Reviewed  COMPREHENSIVE METABOLIC PANEL - Abnormal; Notable for the following components:      Result Value   Sodium 134 (*)    Chloride 96 (*)    Total Protein 8.3 (*)    All other components within normal limits  CBC WITH DIFFERENTIAL/PLATELET - Abnormal; Notable for the following components:   Hemoglobin 17.1 (*)    All other components  within normal limits  URINALYSIS, ROUTINE W REFLEX MICROSCOPIC - Abnormal; Notable for the following components:   Ketones, ur TRACE (*)    All other components within normal limits  RESP PANEL BY RT-PCR (FLU A&B, COVID) ARPGX2  LIPASE, BLOOD  TROPONIN I (HIGH SENSITIVITY)    EKG None  Radiology CT Head Wo Contrast  Result Date: 11/12/2021 CLINICAL DATA:  Headache EXAM: CT HEAD WITHOUT CONTRAST TECHNIQUE: Contiguous axial images were obtained from the base of the skull through the vertex without intravenous contrast. RADIATION DOSE REDUCTION: This exam was performed according to the departmental dose-optimization program which includes automated exposure control, adjustment of the mA and/or kV according to patient size and/or use of iterative reconstruction technique. COMPARISON:  None Available. FINDINGS: Brain: No evidence of acute infarction, hemorrhage, hydrocephalus, extra-axial collection or mass lesion/mass effect. Vascular: Negative for hyperdense vessel Skull: Negative Sinuses/Orbits: Negative Other: None IMPRESSION: Normal CT head Electronically Signed   By: Franchot Gallo M.D.   On: 11/12/2021 19:03     Procedures Procedures   Medications Ordered in ED Medications  dexamethasone (DECADRON) injection 10 mg (10 mg Intravenous Given 11/12/21 1838)  metoCLOPramide (REGLAN) injection 10 mg (10 mg Intravenous Given 11/12/21 1843)   ketorolac (TORADOL) 15 MG/ML injection 15 mg (15 mg Intravenous Given 11/12/21 1840)  lactated ringers bolus 1,000 mL (0 mLs Intravenous Stopped 11/12/21 2001)    ED Course/ Medical Decision Making/ A&P                           Medical Decision Making Amount and/or Complexity of Data Reviewed Labs: ordered. Radiology: ordered.  Risk Prescription drug management.   36 year old male presents the emergency department for evaluation of headache for the past 3 to 4 days with some nausea and vomiting along with momentary speech difficulty.  Differential diagnosis includes but is not limited to TIA, stroke, complex migraine, migraine, headache syndrome, meningitis.  Vital signs show elevated blood pressure at 176/111, normal pulse, afebrile, satting well on room air without any increased work of breathing.  Patient reports that when he was at his primary care office he was told he was borderline hypotensive and if his blood pressure is elevated at the next appointment they would place him on blood pressure medication and that was over a year ago.  Given the patient's headache that is not improving will order head CT as well as basic labs.  Migraine cocktail ordered including Toradol, Reglan, and Decadron as well as a liter of fluids.  I independently reviewed and interpreted the patient's labs.  CBC shows mild increase in hemoglobin of 17.1 with normal hematocrit no signs leukocytosis.  CMP shows mild decrease in sodium 134.  Mild decrease in chloride at 96.  Mildly elevated total protein 8.3 otherwise no electrolyte abnormality.  Urinalysis shows yellow urine with normal super gravity, there is some trace ketones all other.  Normal lipase.  Troponin 3.  Negative for COVID and flu.  CT of head is normal.  Orthostatic vital signs are borderline.  On reevaluation after medication administration, patient reports his headache is less than a 2 out of 10 and he feels much better.  We had a discussion  about his job as well as recent events.  I did not notice any change in his speech or trouble talking.  At this time, I have low suspicion for any TIA or stroke given the patient's reassuring neurological exam as well as normal head  CT.  Will defer MRI for now after discussing with my attending given that he has not had any neurodeficits and its been over 24 hours.  I doubt any meningitis and the patient has full range of his neck without any pain.  No fevers, vital signs stable other than elevated blood pressure.  Likely complex migraine saying that his symptoms vastly improved after migraine cocktail.  Given the patient's elevated blood pressure, I do think is appropriate to start him on monotherapy for blood pressure until he can be seen by primary care doctor.  We will place him on amlodipine for his elevated blood pressure.  This could also be the likely source of his headache.  I discussed the lab and imaging findings with the patient.  I discussed that this is likely a complex migraine given his significant improvement upon his pain with the migraine cocktail and fluids.  I discussed the new blood pressure medication and started him on antibiotic and daily.  I also provided him with information on how to get blood pressure in a form.  I advised him to take his blood pressure daily and record it and bring it to his primary care office at the next meeting.  I did provide him with 30-day supply with 1 refill for him to get established.  I discussed strict return precautions and red flag symptoms if he were to have neurodeficits or signs of stroke return to the nearest emergency department immediately for reevaluation.  Patient verbalized understanding and agrees to plan.  Patient is stable being discharged home in good condition.  I discussed this case with my attending physician who cosigned this note including patient's presenting symptoms, physical exam, and planned diagnostics and interventions.  Attending physician stated agreement with plan or made changes to plan which were implemented.   Final Clinical Impression(s) / ED Diagnoses Final diagnoses:  Bad headache  Elevated blood pressure reading    Rx / DC Orders ED Discharge Orders          Ordered    amLODipine (NORVASC) 5 MG tablet  Daily        11/12/21 2332              Sherrell Puller, PA-C 11/16/21 0022    Tegeler, Gwenyth Allegra, MD 11/16/21 1309

## 2021-11-12 NOTE — Discharge Instructions (Addendum)
You were seen in the emergency department for evaluation of your headache.  Your labs and imaging were unremarkable.  You improved with the migraine cocktail.  This is likely just a bad headache.  Please make sure that you stay well-hydrated with plenty of fluids, mainly water.  You can take Tylenol or ibuprofen as needed for pain.  Something we did discover that was that you have elevated blood pressure.  As we discussed, I am starting you on a small amount of blood pressure medication to help with your elevation.  I want you to be recording her blood pressure at least twice daily and recording it on the form given in this discharge paperwork.  Please schedule an appointment with your primary care doctor when you establish 1 with your insurance and bring this form with you.  If your blood pressure drops below 110/60 you do not need to take any blood pressure medication.  If you have any concern, new or worsening symptoms, please return to the nearest emergency department for reevaluation.  Contact a doctor if: You get a migraine headache that is different or worse than others you have had. You have more than 15 headache days in one month.  Get help right away if: Your migraine headache gets very bad. Your migraine headache lasts longer than 72 hours. You have a fever. You have a stiff neck. You have trouble seeing. Your muscles feel weak or like you cannot control them. You start to lose your balance a lot. You start to have trouble walking. You pass out (faint). You have a seizure.

## 2021-11-12 NOTE — ED Notes (Signed)
Pt did  not tell triage. Pt stated that 0800 he became mush mouthed and started slurring his speech. Pt stated that it lasted 2 hours. Pt stated that he could understand what was being said but he had difficulty getting his words out to say what he wanted to say.   Pt also c/o of some blurry vision yesterday afternoon. Pt stated that he also had balance issues.   Pt has head since last Sunday.

## 2021-11-13 ENCOUNTER — Other Ambulatory Visit (HOSPITAL_COMMUNITY): Payer: Self-pay

## 2021-11-13 NOTE — ED Notes (Signed)
Pt verbalizes understanding of discharge instructions. Opportunity for questioning and answers were provided. Pt discharged from ED to home.   ? ?

## 2021-11-13 NOTE — ED Notes (Addendum)
r 

## 2021-11-13 NOTE — ED Notes (Signed)
Pt called unable to pull up work excuse, new one sent over through [employer]

## 2021-11-14 ENCOUNTER — Emergency Department (HOSPITAL_COMMUNITY)
Admission: EM | Admit: 2021-11-14 | Discharge: 2021-11-14 | Disposition: A | Discharge status: Home / Self Care | Payer: 59 | Attending: Emergency Medicine | Admitting: Emergency Medicine

## 2021-11-14 ENCOUNTER — Other Ambulatory Visit: Payer: Self-pay

## 2021-11-14 ENCOUNTER — Other Ambulatory Visit (HOSPITAL_COMMUNITY): Payer: Self-pay

## 2021-11-14 DIAGNOSIS — R519 Headache, unspecified: Secondary | ICD-10-CM | POA: Diagnosis present

## 2021-11-14 DIAGNOSIS — R42 Dizziness and giddiness: Secondary | ICD-10-CM | POA: Insufficient documentation

## 2021-11-14 DIAGNOSIS — J0100 Acute maxillary sinusitis, unspecified: Secondary | ICD-10-CM | POA: Insufficient documentation

## 2021-11-14 DIAGNOSIS — Z79899 Other long term (current) drug therapy: Secondary | ICD-10-CM | POA: Insufficient documentation

## 2021-11-14 DIAGNOSIS — I10 Essential (primary) hypertension: Secondary | ICD-10-CM | POA: Diagnosis not present

## 2021-11-14 MED ORDER — DOXYCYCLINE HYCLATE 100 MG PO CAPS
100.0000 mg | ORAL_CAPSULE | Freq: Two times a day (BID) | ORAL | 0 refills | Status: DC
  Filled 2021-11-14: qty 14, 7d supply, fill #0

## 2021-11-14 MED ORDER — PROCHLORPERAZINE EDISYLATE 10 MG/2ML IJ SOLN: 10.0000 mg | Freq: Once | INTRAMUSCULAR | Status: DC

## 2021-11-14 MED ORDER — DOXYCYCLINE HYCLATE 100 MG PO CAPS: 100.0000 mg | ORAL_CAPSULE | Freq: Two times a day (BID) | ORAL | 0 refills | Status: DC

## 2021-11-14 MED ORDER — DOXYCYCLINE HYCLATE 100 MG PO TABS
100.0000 mg | ORAL_TABLET | Freq: Once | ORAL | Status: AC
  Administered 2021-11-14: 100 mg via ORAL
  Filled 2021-11-14: qty 1

## 2021-11-14 MED ORDER — DIPHENHYDRAMINE HCL 50 MG/ML IJ SOLN: 25.0000 mg | Freq: Once | INTRAMUSCULAR | Status: DC

## 2021-11-14 MED ORDER — PROCHLORPERAZINE EDISYLATE 10 MG/2ML IJ SOLN
10.0000 mg | Freq: Once | INTRAMUSCULAR | Status: AC
  Administered 2021-11-14: 10 mg via INTRAMUSCULAR
  Filled 2021-11-14: qty 2

## 2021-11-14 MED ORDER — DIPHENHYDRAMINE HCL 50 MG/ML IJ SOLN
25.0000 mg | Freq: Once | INTRAMUSCULAR | Status: AC
  Administered 2021-11-14: 25 mg via INTRAMUSCULAR
  Filled 2021-11-14: qty 1

## 2021-11-14 NOTE — ED Triage Notes (Signed)
Pt. Stated, I started having headache with dizziness and my BP is high. I went to [facility] and they gave me a migraine cocktail  and BP med and this morning I have all the same symptoms

## 2021-11-14 NOTE — Discharge Instructions (Addendum)
As we discussed your recurrent headaches could be due to your sinuses as you have signs of fluid behind both of your ears and signs of sinus irritation.  However it is uncommon to have difficulty speaking without.  I have offered to perform an MRI of the here.  If you are having worsening symptoms or have recurrence of difficulty speaking or worsening dizziness then please return for imaging.  Take tylenol 2 pills 4 times a day and motrin 4 pills 3 times a day.  Drink plenty of fluids.  Return for worsening shortness of breath, headache, confusion. Follow up with your family doctor.

## 2021-11-14 NOTE — ED Provider Notes (Signed)
[facility] EMERGENCY DEPARTMENT Provider Note   CSN: [id number] Arrival date & time: 11/14/21  1130     History  Chief Complaint  Patient presents with   Dizziness    headache   Headache   Hypertension    Michael Hart is a 36 y.o. male.  36 yo M with a chief complaint of a headache.  This has been going on for about 4 days now.  Was very severe at times.  At one point had difficulty speaking.  Had been seen at an emergency department in our system and had CT imaging and blood work that was unremarkable.  Had almost complete resolution with headache cocktail was discharged home.  Had done well for a day and then had recurrence.  Pain is bilateral starts to bilateral temples and then goes along the maxillary sinus.  He has had a little bit of congestion and cough.  Started after he went to the zoo, he does have a history of seasonal allergies but has not been affected significantly in recent years.  He denies trauma to the head or the neck.  Denies any neck pain denies one-sided numbness or weakness denies difficulty with speech other than the event I discussed earlier or swallowing.  He does feel a bit dizzy currently.  He was started on a blood pressure medicine at his last ED visit.   Dizziness Associated symptoms: headaches   Headache Associated symptoms: dizziness   Hypertension Associated symptoms include headaches.      Home Medications Prior to Admission medications   Medication Sig Start Date End Date Taking? Authorizing Provider  doxycycline (VIBRAMYCIN) 100 MG capsule Take 1 capsule (100 mg total) by mouth 2 (two) times daily. One po bid x 7 days 11/14/21  Yes Adela Lank, Lavere Stork, DO  allopurinol (ZYLOPRIM) 300 MG tablet TAKE 1 TABLET BY MOUTH DAILY. 06/27/19   Judi Saa, DO  amLODipine (NORVASC) 5 MG tablet Take 1 tablet by mouth daily. 11/12/21   Achille Rich, PA-C  cetirizine (ZYRTEC) 10 MG tablet Take 10 mg by mouth daily.    [provider]  fluticasone (FLONASE) 50 MCG/ACT nasal spray Place 2 sprays into both nostrils daily. 12/12/20   Margaretann Loveless, PA-C  ipratropium (ATROVENT) 0.06 % nasal spray Place 2 sprays into both nostrils in the morning and at bedtime. 12/12/20   Margaretann Loveless, PA-C  levocetirizine (XYZAL) 5 MG tablet Take 1 tablet (5 mg total) by mouth every evening. 12/12/20   Margaretann Loveless, PA-C  rosuvastatin (CRESTOR) 10 MG tablet Take 1 tablet (10 mg total) by mouth daily. 04/19/19   Pincus Sanes, MD  venlafaxine XR (EFFEXOR-XR) 37.5 MG 24 hr capsule TAKE 1 CAPSULE (37.5 MG TOTAL) BY MOUTH DAILY WITH BREAKFAST. 08/28/19   Judi Saa, DO      Allergies    Penicillins and Sulfa antibiotics    Review of Systems   Review of Systems  Neurological:  Positive for dizziness and headaches.   Physical Exam Updated Vital Signs BP (!) 136/102 (BP Location: Left Arm)   Pulse 88   Temp 97.7 F (36.5 C) (Oral)   Resp 20   SpO2 97%  Physical Exam Vitals and nursing note reviewed.  Constitutional:      Appearance: He is well-developed.  HENT:     Head: Normocephalic and atraumatic.     Comments: Sinus congestion.  Bilateral TMs with effusion without bulging or distortion of landmarks.  Mild  pain along the TMJ bilaterally. Eyes:     Pupils: Pupils are equal, round, and reactive to light.  Neck:     Vascular: No JVD.  Cardiovascular:     Rate and Rhythm: Normal rate and regular rhythm.     Heart sounds: No murmur heard.   No friction rub. No gallop.  Pulmonary:     Effort: No respiratory distress.     Breath sounds: No wheezing.  Abdominal:     General: There is no distension.     Tenderness: There is no abdominal tenderness. There is no guarding or rebound.  Musculoskeletal:        General: Normal range of motion.     Cervical back: Normal range of motion and neck supple.  Skin:    Coloration: Skin is not pale.     Findings: No rash.  Neurological:     Mental Status: He is alert  and oriented to person, place, and time.     Comments: Left sided fast going nystagmus on extreme gaze  Otherwise benign exam.  Psychiatric:        Behavior: Behavior normal.    ED Results / Procedures / Treatments   Labs (all labs ordered are listed, but only abnormal results are displayed) Labs Reviewed - No data to display  EKG None  Radiology CT Head Wo Contrast  Result Date: 11/12/2021 CLINICAL DATA:  Headache EXAM: CT HEAD WITHOUT CONTRAST TECHNIQUE: Contiguous axial images were obtained from the base of the skull through the vertex without intravenous contrast. RADIATION DOSE REDUCTION: This exam was performed according to the departmental dose-optimization program which includes automated exposure control, adjustment of the mA and/or kV according to patient size and/or use of iterative reconstruction technique. COMPARISON:  None Available. FINDINGS: Brain: No evidence of acute infarction, hemorrhage, hydrocephalus, extra-axial collection or mass lesion/mass effect. Vascular: Negative for hyperdense vessel Skull: Negative Sinuses/Orbits: Negative Other: None IMPRESSION: Normal CT head Electronically Signed   By: Marlan Palauharles  Clark M.D.   On: 11/12/2021 19:03    Procedures Procedures    Medications Ordered in ED Medications  diphenhydrAMINE (BENADRYL) injection 25 mg (25 mg Intramuscular Given 11/14/21 1243)  prochlorperazine (COMPAZINE) injection 10 mg (10 mg Intramuscular Given 11/14/21 1243)  doxycycline (VIBRA-TABS) tablet 100 mg (100 mg Oral Given 11/14/21 1244)    ED Course/ Medical Decision Making/ A&P                           Medical Decision Making Risk Prescription drug management.   36 yo M with a chief complaints of headache.  Patient does not typically get headaches.  Is been going on for about 4 to 5 days now.  Had been to an ED in our system and had blood work and a CT scan of the head that I independently interpreted without intracranial hemorrhage.  The  patient had recurrence of his headache about 24 hours ago.  Other than left-sided fast going nystagmus on extreme gaze has a benign neurologic exam here.  Shared decision making with the patient and his mother at bedside.  Discussed the possibility of MRI imaging with his difficulty with speaking earlier in the week and recurrence of his headache.  Currently the patient is declining.  As he has signs of sinusitis on exam I will treat with antibiotics.  We will have him follow-up with his family doctor.  Encouraged to return for imaging if he changes mind or symptoms worsen.  He was given neurology follow-up.  12:45 PM:  I have discussed the diagnosis/risks/treatment options with the patient and family.  Evaluation and diagnostic testing in the emergency department does not suggest an emergent condition requiring admission or immediate intervention beyond what has been performed at this time.  They will follow up with  PCP, neuro. We also discussed returning to the ED immediately if new or worsening sx occur. We discussed the sx which are most concerning (e.g., sudden worsening pain, fever, inability to tolerate by mouth) that necessitate immediate return. Medications administered to the patient during their visit and any new prescriptions provided to the patient are listed below.  Medications given during this visit Medications  diphenhydrAMINE (BENADRYL) injection 25 mg (25 mg Intramuscular Given 11/14/21 1243)  prochlorperazine (COMPAZINE) injection 10 mg (10 mg Intramuscular Given 11/14/21 1243)  doxycycline (VIBRA-TABS) tablet 100 mg (100 mg Oral Given 11/14/21 1244)     The patient appears reasonably screen and/or stabilized for discharge and I doubt any other medical condition or other [facility] requiring further screening, evaluation, or treatment in the ED at this time prior to discharge.          Final Clinical Impression(s) / ED Diagnoses Final diagnoses:  Acute maxillary sinusitis,  recurrence not specified    Rx / DC Orders ED Discharge Orders          Ordered    doxycycline (VIBRAMYCIN) 100 MG capsule  2 times daily        11/14/21 1240    Ambulatory referral to Neurology       Comments: New headache syndrome?   11/14/21 1241              Melene Plan, DO 11/14/21 1508

## 2021-11-15 NOTE — ED Provider Notes (Incomplete)
[facility] EMERGENCY DEPT Provider Note   CSN: [id number] Arrival date & time: 11/12/21  1414     History Chief Complaint  Patient presents with  . Emesis  . Headache    Michael Hart is a 36 y.o. male with h/o asthma presents to the ED for evaluation of left sided headache for the past 3 days.    Emesis Associated symptoms: headaches   Associated symptoms: no abdominal pain, no chills, no diarrhea, no fever and no sore throat   Headache Associated symptoms: vomiting   Associated symptoms: no abdominal pain, no congestion, no diarrhea, no fever, no nausea, no photophobia, no sore throat and no weakness       Home Medications Prior to Admission medications   Medication Sig Start Date End Date Taking? Authorizing Provider  allopurinol (ZYLOPRIM) 300 MG tablet TAKE 1 TABLET BY MOUTH DAILY. 06/27/19   Lyndal Pulley, DO  cetirizine (ZYRTEC) 10 MG tablet Take 10 mg by mouth daily.    [provider]  fluticasone (FLONASE) 50 MCG/ACT nasal spray Place 2 sprays into both nostrils daily. 12/12/20   Mar Daring, PA-C  ipratropium (ATROVENT) 0.06 % nasal spray Place 2 sprays into both nostrils in the morning and at bedtime. 12/12/20   Mar Daring, PA-C  levocetirizine (XYZAL) 5 MG tablet Take 1 tablet (5 mg total) by mouth every evening. 12/12/20   Mar Daring, PA-C  rosuvastatin (CRESTOR) 10 MG tablet Take 1 tablet (10 mg total) by mouth daily. 04/19/19   Binnie Rail, MD  venlafaxine XR (EFFEXOR-XR) 37.5 MG 24 hr capsule TAKE 1 CAPSULE (37.5 MG TOTAL) BY MOUTH DAILY WITH BREAKFAST. 08/28/19   Lyndal Pulley, DO      Allergies    Penicillins and Sulfa antibiotics    Review of Systems   Review of Systems  Constitutional:  Negative for chills and fever.  HENT:  Negative for congestion, rhinorrhea and sore throat.   Eyes:  Negative for photophobia and visual disturbance.  Respiratory:  Negative for shortness of breath.    Cardiovascular:  Negative for chest pain.  Gastrointestinal:  Positive for vomiting. Negative for abdominal pain, constipation, diarrhea and nausea.  Neurological:  Positive for headaches. Negative for syncope, weakness and light-headedness.   Physical Exam Updated Vital Signs BP (!) 176/111 (BP Location: Right Arm)   Pulse 73   Temp 98.5 F (36.9 C) (Oral)   Resp 18   Ht 5\' 10"  (1.778 m)   Wt 111.1 kg   SpO2 98%   BMI 35.15 kg/m  Physical Exam  ED Results / Procedures / Treatments   Labs (all labs ordered are listed, but only abnormal results are displayed) Labs Reviewed  COMPREHENSIVE METABOLIC PANEL - Abnormal; Notable for the following components:      Result Value   Sodium 134 (*)    Chloride 96 (*)    Total Protein 8.3 (*)    All other components within normal limits  CBC WITH DIFFERENTIAL/PLATELET - Abnormal; Notable for the following components:   Hemoglobin 17.1 (*)    All other components within normal limits  URINALYSIS, ROUTINE W REFLEX MICROSCOPIC - Abnormal; Notable for the following components:   Ketones, ur TRACE (*)    All other components within normal limits  RESP PANEL BY RT-PCR (FLU A&B, COVID) ARPGX2  LIPASE, BLOOD  TROPONIN I (HIGH SENSITIVITY)    EKG None  Radiology No results found.  Procedures Procedures  {Document cardiac monitor, telemetry  assessment procedure when appropriate:1}  Medications Ordered in ED Medications - No data to display  ED Course/ Medical Decision Making/ A&P                           Medical Decision Making Amount and/or Complexity of Data Reviewed Labs: ordered. Radiology: ordered.  Risk Prescription drug management.   ***  {Document critical care time when appropriate:1} {Document review of labs and clinical decision tools ie heart score, Chads2Vasc2 etc:1}  {Document your independent review of radiology images, and any outside records:1} {Document your discussion with family members, caretakers,  and with consultants:1} {Document social determinants of health affecting pt's care:1} {Document your decision making why or why not admission, treatments were needed:1} Final Clinical Impression(s) / ED Diagnoses Final diagnoses:  None    Rx / DC Orders ED Discharge Orders     None

## 2021-11-24 ENCOUNTER — Encounter: Payer: Self-pay | Admitting: Neurology

## 2021-11-24 ENCOUNTER — Ambulatory Visit: Payer: 59 | Admitting: Neurology

## 2021-11-24 ENCOUNTER — Other Ambulatory Visit (HOSPITAL_COMMUNITY): Payer: Self-pay

## 2021-11-24 VITALS — BP 132/90 | HR 86 | Ht 70.0 in | Wt 241.4 lb

## 2021-11-24 DIAGNOSIS — G43109 Migraine with aura, not intractable, without status migrainosus: Secondary | ICD-10-CM | POA: Diagnosis not present

## 2021-11-24 MED ORDER — RIZATRIPTAN BENZOATE 10 MG PO TBDP
10.0000 mg | ORAL_TABLET | ORAL | 11 refills | Status: DC | PRN
  Filled 2021-11-24: qty 9, 28d supply, fill #0
  Filled 2022-02-20: qty 9, 30d supply, fill #0

## 2021-11-24 MED ORDER — ONDANSETRON 4 MG PO TBDP
4.0000 mg | ORAL_TABLET | Freq: Three times a day (TID) | ORAL | 3 refills | Status: DC | PRN
  Filled 2021-11-24: qty 30, 5d supply, fill #0

## 2021-11-24 NOTE — Progress Notes (Signed)
[facility]    Provider:  Dr Lucia GaskinsAhern Requesting Provider: Melene PlanFloyd, Dan, DO Primary Care Provider:  Pincus SanesBurns, Stacy J, MD  CC:  migraine  HPI:  Michael AmsterdamWilliam H Bally is a 36 y.o. male here as requested by Melene PlanFloyd, Dan, DO for migraines. PMHx asthma, gout, kidney stone, panic attacks, obesity, dyslipidemia, hyperglycemia, anxiety. He was seen in the ED twice with headaches, was started on HTN medication  due to elevated blood pressure and then treated for a sinus infection. Was treated with migraine medication for headache. Was started on amlodipine, is on effexor.   2 weeks ago he started feeling bad, like a virus, prior to this never had headaches, he went to work and had to leave because he was feeling so bad, he got home and he had to lay in bed, felt fatigued, he was vomiting, alternating tylenol and ibuprofen, he thinks he had a fever, a few days later he went to urgent care started him on a BP medication due to elevated blood pressure amlodipine, it has been running high, he still has congestion in his ears and dizziness, he completed the antibiotics, he is still doing flonase, the antibiotics made him feel better, he went back to work and is better. During this episode of not feeling well, he tried to say something to his dog and couldn't produce the words, no sound came out, not slurred, not aphasic speech, he immediately called someone and spoke to them and they understood him, he had headache, dizziness, had pounding/pulsating/throbbing, also pain in the maxillary sinuses bilaterally, he had light and sound sensitivity and nausea and vomiting. He may have had a migraine in the past as a child at 89 but nothing. They gave him a migraine cocktail the first urgent care visit but it came back [facility], then went to [facility] and treated for sinus infection and the headache resolved on antibiotics.   Reviewed notes, labs and imaging from outside physicians, which showed:  Reviewed med hx, meds  tried that can be used in migraine management include: Tylenol, gabapentin, toradol, medrol dosepak, prednisone, compazine, effexor,   CT head 11/12/2021: showed No acute intracranial abnormalities including mass lesion or mass effect, hydrocephalus, extra-axial fluid collection, midline shift, hemorrhage, or acute infarction, large ischemic events (personally reviewed images)  11/12/2021: reviewed labs from hospital encounter cbc/cmp unremarkable   Review of Systems: Patient complains of symptoms per HPI as well as the following symptoms allergies. Pertinent negatives and positives per HPI. All others negative.   Social History   Socioeconomic History   Marital status: Married    Spouse name: Not on file   Number of children: Not on file   Years of education: Not on file   Highest education level: Not on file  Occupational History   Not on file  Tobacco Use   Smoking status: Former   Smokeless tobacco: Former    Quit date: 06/23/2011  Substance and Sexual Activity   Alcohol use: Yes    Alcohol/week: 2.0 standard drinks    Types: 2 Glasses of wine per week    Comment: occasional   Drug use: No   Sexual activity: Not on file  Other Topics Concern   Not on file  Social History Narrative   Married, lives with spouse   Engineer, maintenance (IT)College graduate, 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   Social Determinants of Corporate investment bankerHealth   Financial Resource Strain: Not on file  Food Insecurity: Not on file  Transportation Needs: Not on file  Physical Activity:  Not on file  Stress: Not on file  Social Connections: Not on file  Intimate Partner Violence: Not on file    Family History  Problem Relation Age of Onset   Breast cancer Mother    Lung cancer Father    Heart disease Father        CABG   COPD Father    Hypertension Other    Stroke Other    Migraines Neg Hx    Headache Neg Hx    Sleep apnea Neg Hx     Past Medical History:  Diagnosis Date   Allergy    Asthma    Gout    Kidney stone    Panic  attacks    nocturnal, controls with self directed biofeedback    Patient Active Problem List   Diagnosis Date Noted   Migraine with aura and without status migrainosus, not intractable 11/24/2021   Hyperglycemia 04/19/2019   Bereavement 04/10/2019   Carpal tunnel syndrome of left wrist 11/30/2018   Anxiety 04/08/2018   Chronic or recurrent subluxation of peroneal tendon of right foot 07/29/2017   Hematuria 04/05/2017   History of gout 10/22/2015   Low testosterone 10/22/2015   Mixed dyslipidemia 12/05/2014   Obese     Past Surgical History:  Procedure Laterality Date   NO PAST SURGERIES      Current Outpatient Medications  Medication Sig Dispense Refill   amLODipine (NORVASC) 5 MG tablet Take 1 tablet by mouth daily. 30 tablet 1   cetirizine (ZYRTEC) 10 MG tablet Take 10 mg by mouth as needed.     ondansetron (ZOFRAN-ODT) 4 MG disintegrating tablet Dissolve 1-2 tablets (4-8 mg total) by mouth every 8 (eight) hours as needed. 30 tablet 3   rizatriptan (MAXALT-MLT) 10 MG disintegrating tablet Dissolve 1 tablet (10 mg total) by mouth as needed for migraine. May repeat in 2 hours if needed 9 tablet 11   No current facility-administered medications for this visit.    Allergies as of 11/24/2021 - Review Complete 11/24/2021  Allergen Reaction Noted   Penicillins  02/15/2013   Sulfa antibiotics  02/15/2013    Vitals: BP 132/90   Pulse 86   Ht 5\' 10"  (1.778 m)   Wt 241 lb 6.4 oz (109.5 kg)   BMI 34.64 kg/m  Last Weight:  Wt Readings from Last 1 Encounters:  11/24/21 241 lb 6.4 oz (109.5 kg)   Last Height:   Ht Readings from Last 1 Encounters:  11/24/21 5\' 10"  (1.778 m)     Physical exam: Exam: Gen: NAD, conversant, well nourised, obese, well groomed                     CV: RRR, no MRG. No Carotid Bruits. No peripheral edema, warm, nontender Eyes: Conjunctivae clear without exudates or hemorrhage  Neuro: Detailed Neurologic Exam  Speech:    Speech is normal;  fluent and spontaneous with normal comprehension.  Cognition:    The patient is oriented to person, place, and time;     recent and remote memory intact;     language fluent;     normal attention, concentration,     fund of knowledge Cranial Nerves:    The pupils are equal, round, and reactive to light. The fundi are normal and spontaneous venous pulsations are present. Visual fields are full to finger confrontation. Extraocular movements are intact. Trigeminal sensation is intact and the muscles of mastication are normal. The face is symmetric. The palate elevates  in the midline. Hearing intact. Voice is normal. Shoulder shrug is normal. The tongue has normal motion without fasciculations.   Coordination:    Normal finger to nose and heel to shin.   Gait:  normal.   Motor Observation:    No asymmetry, no atrophy, and no involuntary movements noted. Tone:    Normal muscle tone.    Posture:    Posture is normal. normal erect    Strength:    Strength is V/V in the upper and lower limbs.      Sensation: intact to LT     Reflex Exam:  DTR's:    Deep tendon reflexes in the upper and lower extremities are normal bilaterally.   Toes:    The toes are downgoing bilaterally.   Clonus:    Clonus is absent.    Assessment/Plan:  Migraine in the setting of sinus infection, feeling much improved after a course of antibiotics. CT head was negative, at this time no indication for MRI of the brain but patient is more than welcome to contact us or come back if he has new or worsening symptoms.  In the future If another migraine will prescribe Rizatriptan: Please take one tablet at the onset of your headache. If it does not improve the symptoms please take one additional tablet. Do not take more then 2 tablets in 24hrs. Do not take use more then 2 to 3 times in a week. Nausea/vomiting: Ondansetron  Headache/migraine in the setting of viral infection and possibly sinus infection. Improved with  treatment of sinus infection, also started on amlodipine. FMLA Due the 14th of this month. Happy to fill out FMLA 5/22,5/23 and 5/25-5/27 he was out for illness. He does not want any ongoing days.   Meds ordered this encounter  Medications   rizatriptan (MAXALT-MLT) 10 MG disintegrating tablet    Sig: Dissolve 1 tablet (10 mg total) by mouth as needed for migraine. May repeat in 2 hours if needed    Dispense:  9 tablet    Refill:  11   ondansetron (ZOFRAN-ODT) 4 MG disintegrating tablet    Sig: Dissolve 1-2 tablets (4-8 mg total) by mouth every 8 (eight) hours as needed.    Dispense:  30 tablet    Refill:  3    Cc: Melene Plan, DO,  Burns, Bobette Mo, MD  Naomie Dean, MD  [facility] [address] [place], [place] [address]  Phone [phone] Fax [phone]

## 2021-11-24 NOTE — Patient Instructions (Signed)
Migraine in the setting of infection In the future If another migraine will prescribe Rizatriptan: Please take one tablet at the onset of your headache. If it does not improve the symptoms please take one additional tablet. Do not take more then 2 tablets in 24hrs. Do not take use more then 2 to 3 times in a week. Nausea/vomiting: Ondansetron  Migraine Headache A migraine headache is an intense, throbbing pain on one side or both sides of the head. Migraine headaches may also cause other symptoms, such as nausea, vomiting, and sensitivity to light and noise. A migraine headache can last from 4 hours to 3 days. Talk with your doctor about what things may bring on (trigger) your migraine headaches. What are the causes? The exact cause of this condition is not known. However, a migraine may be caused when nerves in the brain become irritated and release chemicals that cause inflammation of blood vessels. This inflammation causes pain. This condition may be triggered or caused by: Drinking alcohol. Smoking. Taking medicines, such as: Medicine used to treat chest pain (nitroglycerin). Birth control pills. Estrogen. Certain blood pressure medicines. Eating or drinking products that contain nitrates, glutamate, aspartame, or tyramine. Aged cheeses, chocolate, or caffeine may also be triggers. Doing physical activity. Other things that may trigger a migraine headache include: Menstruation. Pregnancy. Hunger. Stress. Lack of sleep or too much sleep. Weather changes. Fatigue. What increases the risk? The following factors may make you more likely to experience migraine headaches: Being a certain age. This condition is more common in people who are 5525-38 years old. Being male. Having a family history of migraine headaches. Being Caucasian. Having a mental health condition, such as depression or anxiety. Being obese. What are the signs or symptoms? The main symptom of this condition is  pulsating or throbbing pain. This pain may: Happen in any area of the head, such as on one side or both sides. Interfere with daily activities. Get worse with physical activity. Get worse with exposure to bright lights or loud noises. Other symptoms may include: Nausea. Vomiting. Dizziness. General sensitivity to bright lights, loud noises, or smells. Before you get a migraine headache, you may get warning signs (an aura). An aura may include: Seeing flashing lights or having blind spots. Seeing bright spots, halos, or zigzag lines. Having tunnel vision or blurred vision. Having numbness or a tingling feeling. Having trouble talking. Having muscle weakness. Some people have symptoms after a migraine headache (postdromal phase), such as: Feeling tired. Difficulty concentrating. How is this diagnosed? A migraine headache can be diagnosed based on: Your symptoms. A physical exam. Tests, such as: CT scan or an MRI of the head. These imaging tests can help rule out other causes of headaches. Taking fluid from the spine (lumbar puncture) and analyzing it (cerebrospinal fluid analysis, or CSF analysis). How is this treated? This condition may be treated with medicines that: Relieve pain. Relieve nausea. Prevent migraine headaches. Treatment for this condition may also include: Acupuncture. Lifestyle changes like avoiding foods that trigger migraine headaches. Biofeedback. Cognitive behavioral therapy. Follow these instructions at home: Medicines Take over-the-counter and prescription medicines only as told by your health care provider. Ask your health care provider if the medicine prescribed to you: Requires you to avoid driving or using heavy machinery. Can cause constipation. You may need to take these actions to prevent or treat constipation: Drink enough fluid to keep your urine pale yellow. Take over-the-counter or prescription medicines. Eat foods that are high in fiber,  such as beans, whole grains, and fresh fruits and vegetables. Limit foods that are high in fat and processed sugars, such as fried or sweet foods. Lifestyle Do not drink alcohol. Do not use any products that contain nicotine or tobacco, such as cigarettes, e-cigarettes, and chewing tobacco. If you need help quitting, ask your health care provider. Get at least 8 hours of sleep every night. Find ways to manage stress, such as meditation, deep breathing, or yoga. General instructions     Keep a journal to find out what may trigger your migraine headaches. For example, write down: What you eat and drink. How much sleep you get. Any change to your diet or medicines. If you have a migraine headache: Avoid things that make your symptoms worse, such as bright lights. It may help to lie down in a dark, quiet room. Do not drive or use heavy machinery. Ask your health care provider what activities are safe for you while you are experiencing symptoms. Keep all follow-up visits as told by your health care provider. This is important. Contact a health care provider if: You develop symptoms that are different or more severe than your usual migraine headache symptoms. You have more than 15 headache days in one month. Get help right away if: Your migraine headache becomes severe. Your migraine headache lasts longer than 72 hours. You have a fever. You have a stiff neck. You have vision loss. Your muscles feel weak or like you cannot control them. You start to lose your balance often. You have trouble walking. You faint. You have a seizure. Summary A migraine headache is an intense, throbbing pain on one side or both sides of the head. Migraines may also cause other symptoms, such as nausea, vomiting, and sensitivity to light and noise. This condition may be treated with medicines and lifestyle changes. You may also need to avoid certain things that trigger a migraine headache. Keep a journal to  find out what may trigger your migraine headaches. Contact your health care provider if you have more than 15 headache days in a month or you develop symptoms that are different or more severe than your usual migraine headache symptoms. This information is not intended to replace advice given to you by your health care provider. Make sure you discuss any questions you have with your health care provider. Document Revised: 09/30/2018 Document Reviewed: 07/21/2018 Elsevier Patient Education  2023 [employer] Rizatriptan Tablets What is this medication? RIZATRIPTAN (rye za TRIP tan) treats migraines. It works by blocking pain signals and narrowing blood vessels in the brain. It belongs to a group of medications called triptans. It is not used to prevent migraines. This medicine may be used for other purposes; ask your health care provider or pharmacist if you have questions. COMMON BRAND NAME(S): Maxalt What should I tell my care team before I take this medication? They need to know if you have any of these conditions: Cigarette smoker Circulation problems in fingers and toes Diabetes Heart disease High blood pressure High cholesterol History of irregular heartbeat History of stroke Kidney disease Liver disease Stomach or intestine problems An unusual or allergic reaction to rizatriptan, other medications, foods, dyes, or preservatives Pregnant or trying to get pregnant Breast-feeding How should I use this medication? Take this medication by mouth with a glass of water. Follow the directions on the prescription label. Do not take it more often than directed. Talk to your care team regarding the use of this medication in children. While  this medication may be prescribed for children as young as 6 years for selected conditions, precautions do apply. Overdosage: If you think you have taken too much of this medicine contact a poison control center or emergency room at once. NOTE: This  medicine is only for you. Do not share this medicine with others. What if I miss a dose? This does not apply. This medication is not for regular use. What may interact with this medication? Do not take this medication with any of the following: Certain medications for migraine headache like almotriptan, eletriptan, frovatriptan, naratriptan, rizatriptan, sumatriptan, zolmitriptan Ergot alkaloids like dihydroergotamine, ergonovine, ergotamine, methylergonovine MAOIs like Carbex, Eldepryl, Marplan, Nardil, and Parnate This medication may also interact with the following: Certain medications for depression, anxiety, or psychotic disorders Propranolol This list may not describe all possible interactions. Give your health care provider a list of all the medicines, herbs, non-prescription drugs, or dietary supplements you use. Also tell them if you smoke, drink alcohol, or use illegal drugs. Some items may interact with your medicine. What should I watch for while using this medication? Visit your care team for regular checks on your progress. Tell your care team if your symptoms do not start to get better or if they get worse. You may get drowsy or dizzy. Do not drive, use machinery, or do anything that needs mental alertness until you know how this medication affects you. Do not stand up or sit up quickly, especially if you are an older patient. This reduces the risk of dizzy or fainting spells. Alcohol may interfere with the effect of this medication. Your mouth may get dry. Chewing sugarless gum or sucking hard candy and drinking plenty of water may help. Contact your care team if the problem does not go away or is severe. If you take migraine medications for 10 or more days a month, your migraines may get worse. Keep a diary of headache days and medication use. Contact your care team if your migraine attacks occur more frequently. What side effects may I notice from receiving this medication? Side  effects that you should report to your care team as soon as possible: Allergic reactions--skin rash, itching, hives, swelling of the face, lips, tongue, or throat Burning, pain, tingling, or color changes in the legs or feet Heart attack--pain or tightness in the chest, shoulders, arms, or jaw, nausea, shortness of breath, cold or clammy skin, feeling faint or lightheaded Heart rhythm changes--fast or irregular heartbeat, dizziness, feeling faint or lightheaded, chest pain, trouble breathing Increase in blood pressure Irritability, confusion, fast or irregular heartbeat, muscle stiffness, twitching muscles, sweating, high fever, seizure, chills, vomiting, diarrhea, which may be signs of serotonin syndrome Raynaud's--cool, numb, or painful fingers or toes that may change color from pale, to blue, to red Seizures Stroke--sudden numbness or weakness of the face, arm, or leg, trouble speaking, confusion, trouble walking, loss of balance or coordination, dizziness, severe headache, change in vision Sudden or severe stomach pain, nausea, vomiting, fever, or bloody diarrhea Vision loss Side effects that usually do not require medical attention (report to your care team if they continue or are bothersome): Dizziness General discomfort or fatigue This list may not describe all possible side effects. Call your doctor for medical advice about side effects. You may report side effects to FDA at 1-800-FDA-1088. Where should I keep my medication? Keep out of the reach of children and pets. Store at room temperature between 15 and 30 degrees C (59 and 86 degrees F). Keep  container tightly closed. Throw away any unused medication after the expiration date. NOTE: This sheet is a summary. It may not cover all possible information. If you have questions about this medicine, talk to your doctor, pharmacist, or health care provider.  2023 Elsevier/Gold Standard (2020-07-17 00:00:00) Ondansetron Dissolving  Tablets What is this medication? ONDANSETRON (on DAN se tron) prevents nausea and vomiting from chemotherapy, radiation, or surgery. It works by blocking substances in the body that may cause nausea or vomiting. It belongs to a group of medications called antiemetics. This medicine may be used for other purposes; ask your health care provider or pharmacist if you have questions. COMMON BRAND NAME(S): Zofran ODT What should I tell my care team before I take this medication? They need to know if you have any of these conditions: Heart disease History of irregular heartbeat Liver disease Low levels of magnesium or potassium in the blood An unusual or allergic reaction to ondansetron, granisetron, other medications, foods, dyes, or preservatives Pregnant or trying to get pregnant Breast-feeding How should I use this medication? These tablets are made to dissolve in the mouth. Do not try to push the tablet through the foil backing. With dry hands, peel away the foil backing and gently remove the tablet. Place the tablet in the mouth and allow it to dissolve, then swallow. While you may take these tablets with water, it is not necessary to do so. Talk to your care team regarding the use of this medication in children. Special care may be needed. Overdosage: If you think you have taken too much of this medicine contact a poison control center or emergency room at once. NOTE: This medicine is only for you. Do not share this medicine with others. What if I miss a dose? If you miss a dose, take it as soon as you can. If it is almost time for your next dose, take only that dose. Do not take double or extra doses. What may interact with this medication? Do not take this medication with any of the following: Apomorphine Certain medications for fungal infections like fluconazole, itraconazole, ketoconazole, posaconazole, voriconazole Cisapride Dronedarone Pimozide Thioridazine This medication may also  interact with the following: Carbamazepine Certain medications for depression, anxiety, or psychotic disturbances Fentanyl Linezolid MAOIs like Carbex, Eldepryl, Marplan, Nardil, and Parnate Methylene blue (injected into a vein) Other medications that prolong the QT interval (cause an abnormal heart rhythm) like dofetilide, ziprasidone Phenytoin Rifampicin Tramadol This list may not describe all possible interactions. Give your health care provider a list of all the medicines, herbs, non-prescription drugs, or dietary supplements you use. Also tell them if you smoke, drink alcohol, or use illegal drugs. Some items may interact with your medicine. What should I watch for while using this medication? Check with your care team as soon as you can if you have any sign of an allergic reaction. What side effects may I notice from receiving this medication? Side effects that you should report to your care team as soon as possible: Allergic reactions--skin rash, itching, hives, swelling of the face, lips, tongue, or throat Bowel blockage--stomach cramping, unable to have a bowel movement or pass gas, loss of appetite, vomiting Chest pain (angina)--pain, pressure, or tightness in the chest, neck, back, or arms Heart rhythm changes--fast or irregular heartbeat, dizziness, feeling faint or lightheaded, chest pain, trouble breathing Irritability, confusion, fast or irregular heartbeat, muscle stiffness, twitching muscles, sweating, high fever, seizure, chills, vomiting, diarrhea, which may be signs of serotonin syndrome Side  effects that usually do not require medical attention (report to your care team if they continue or are bothersome): Constipation Diarrhea General discomfort and fatigue Headache This list may not describe all possible side effects. Call your doctor for medical advice about side effects. You may report side effects to FDA at 1-800-FDA-1088. Where should I keep my medication? Keep  out of the reach of children and pets. Store between 2 and 30 degrees C (36 and 86 degrees F). Throw away any unused medication after the expiration date. NOTE: This sheet is a summary. It may not cover all possible information. If you have questions about this medicine, talk to your doctor, pharmacist, or health care provider.  2023 Elsevier/Gold Standard (2020-07-12 00:00:00)

## 2021-11-25 DIAGNOSIS — Z0289 Encounter for other administrative examinations: Secondary | ICD-10-CM

## 2021-11-26 ENCOUNTER — Telehealth: Payer: Self-pay | Admitting: *Deleted

## 2021-11-26 NOTE — Telephone Encounter (Signed)
FMLA form completed for work excuse for specific days 11/10/21, 11/11/21, 11/13/21-11/15/21, signed, and sent to medical records for processing.

## 2021-12-08 ENCOUNTER — Other Ambulatory Visit (HOSPITAL_COMMUNITY): Payer: Self-pay

## 2021-12-11 ENCOUNTER — Ambulatory Visit: Payer: 59 | Admitting: Family Medicine

## 2022-01-28 ENCOUNTER — Encounter: Payer: Self-pay | Admitting: Family Medicine

## 2022-01-28 ENCOUNTER — Ambulatory Visit: Payer: 59 | Admitting: Family Medicine

## 2022-01-28 VITALS — BP 148/100 | HR 75 | Temp 97.8°F | Ht 70.0 in | Wt 249.0 lb

## 2022-01-28 DIAGNOSIS — R030 Elevated blood-pressure reading, without diagnosis of hypertension: Secondary | ICD-10-CM | POA: Insufficient documentation

## 2022-01-28 DIAGNOSIS — Z8249 Family history of ischemic heart disease and other diseases of the circulatory system: Secondary | ICD-10-CM | POA: Diagnosis not present

## 2022-01-28 DIAGNOSIS — E782 Mixed hyperlipidemia: Secondary | ICD-10-CM

## 2022-01-28 DIAGNOSIS — M1A9XX0 Chronic gout, unspecified, without tophus (tophi): Secondary | ICD-10-CM

## 2022-01-28 DIAGNOSIS — R7989 Other specified abnormal findings of blood chemistry: Secondary | ICD-10-CM | POA: Diagnosis not present

## 2022-01-28 DIAGNOSIS — E669 Obesity, unspecified: Secondary | ICD-10-CM | POA: Insufficient documentation

## 2022-01-28 LAB — COMPREHENSIVE METABOLIC PANEL
ALT: 37 U/L (ref 0–53)
AST: 26 U/L (ref 0–37)
Albumin: 4.6 g/dL (ref 3.5–5.2)
Alkaline Phosphatase: 42 U/L (ref 39–117)
BUN: 16 mg/dL (ref 6–23)
CO2: 27 mEq/L (ref 19–32)
Calcium: 9.6 mg/dL (ref 8.4–10.5)
Chloride: 106 mEq/L (ref 96–112)
Creatinine, Ser: 0.99 mg/dL (ref 0.40–1.50)
GFR: 96.79 mL/min (ref >60.00)
Glucose, Bld: 90 mg/dL (ref 70–99)
Potassium: 4 mEq/L (ref 3.5–5.1)
Sodium: 136 mEq/L (ref 135–145)
Total Bilirubin: 0.5 mg/dL (ref 0.2–1.2)
Total Protein: 7.4 g/dL (ref 6.0–8.3)

## 2022-01-28 LAB — CBC WITH DIFFERENTIAL/PLATELET
Basophils Absolute: 0.1 10*3/uL (ref 0.0–0.1)
Basophils Relative: 0.8 % (ref 0.0–3.0)
Eosinophils Absolute: 0.2 10*3/uL (ref 0.0–0.7)
Eosinophils Relative: 2.3 % (ref 0.0–5.0)
HCT: 41.5 % (ref 39.0–52.0)
Hemoglobin: 14.7 g/dL (ref 13.0–17.0)
Lymphocytes Relative: 26.3 % (ref 12.0–46.0)
Lymphs Abs: 2 10*3/uL (ref 0.7–4.0)
MCHC: 35.4 g/dL (ref 30.0–36.0)
MCV: 87.9 fl (ref 78.0–100.0)
Monocytes Absolute: 0.7 10*3/uL (ref 0.1–1.0)
Monocytes Relative: 9.1 % (ref 3.0–12.0)
Neutro Abs: 4.6 10*3/uL (ref 1.4–7.7)
Neutrophils Relative %: 61.5 % (ref 43.0–77.0)
Platelets: 280 10*3/uL (ref 150.0–400.0)
RBC: 4.71 Mil/uL (ref 4.22–5.81)
RDW: 12.9 % (ref 11.5–15.5)
WBC: 7.5 10*3/uL (ref 4.0–10.5)

## 2022-01-28 LAB — URIC ACID: Uric Acid, Serum: 9.3 mg/dL — ABNORMAL HIGH (ref 4.0–7.8)

## 2022-01-28 LAB — LIPID PANEL
Cholesterol: 239 mg/dL — ABNORMAL HIGH (ref 0–200)
HDL: 32.2 mg/dL — ABNORMAL LOW (ref >39.00)
NonHDL: 206.47
Total CHOL/HDL Ratio: 7
Triglycerides: 370 mg/dL — ABNORMAL HIGH (ref 0.0–149.0)
VLDL: 74 mg/dL — ABNORMAL HIGH (ref 0.0–40.0)

## 2022-01-28 LAB — TSH: TSH: 1.81 u[IU]/mL (ref 0.35–5.50)

## 2022-01-28 LAB — LDL CHOLESTEROL, DIRECT: Direct LDL: 151 mg/dL

## 2022-01-28 LAB — T4, FREE: Free T4: 0.75 ng/dL (ref 0.60–1.60)

## 2022-01-28 NOTE — Patient Instructions (Signed)
Keep an eye on your blood pressure for the next 4 weeks.  Normal readings are 130/80 or lower.   We will be in touch with your lab results.   Follow up in 4 weeks     DASH Eating Plan DASH stands for Dietary Approaches to Stop Hypertension. The DASH eating plan is a healthy eating plan that has been shown to: Reduce high blood pressure (hypertension). Reduce your risk for type 2 diabetes, heart disease, and stroke. Help with weight loss. What are tips for following this plan? Reading food labels Check food labels for the amount of salt (sodium) per serving. Choose foods with less than 5 percent of the Daily Value of sodium. Generally, foods with less than 300 milligrams (mg) of sodium per serving fit into this eating plan. To find whole grains, look for the word "whole" as the first word in the ingredient list. Shopping Buy products labeled as "low-sodium" or "no salt added." Buy fresh foods. Avoid canned foods and pre-made or frozen meals. Cooking Avoid adding salt when cooking. Use salt-free seasonings or herbs instead of table salt or sea salt. Check with your health care provider or pharmacist before using salt substitutes. Do not fry foods. Cook foods using healthy methods such as baking, boiling, grilling, roasting, and broiling instead. Cook with heart-healthy oils, such as olive, canola, avocado, soybean, or sunflower oil. Meal planning  Eat a balanced diet that includes: 4 or more servings of fruits and 4 or more servings of vegetables each day. Try to fill one-half of your plate with fruits and vegetables. 6-8 servings of whole grains each day. Less than 6 oz (170 g) of lean meat, poultry, or fish each day. A 3-oz (85-g) serving of meat is about the same size as a deck of cards. One egg equals 1 oz (28 g). 2-3 servings of low-fat dairy each day. One serving is 1 cup (237 mL). 1 serving of nuts, seeds, or beans 5 times each week. 2-3 servings of heart-healthy fats. Healthy  fats called omega-3 fatty acids are found in foods such as walnuts, flaxseeds, fortified milks, and eggs. These fats are also found in cold-water fish, such as sardines, salmon, and mackerel. Limit how much you eat of: Canned or prepackaged foods. Food that is high in trans fat, such as some fried foods. Food that is high in saturated fat, such as fatty meat. Desserts and other sweets, sugary drinks, and other foods with added sugar. Full-fat dairy products. Do not salt foods before eating. Do not eat more than 4 egg yolks a week. Try to eat at least 2 vegetarian meals a week. Eat more home-cooked food and less restaurant, buffet, and fast food. Lifestyle When eating at a restaurant, ask that your food be prepared with less salt or no salt, if possible. If you drink alcohol: Limit how much you use to: 0-1 drink a day for women who are not pregnant. 0-2 drinks a day for men. Be aware of how much alcohol is in your drink. In the [place] one drink equals one 12 oz bottle of beer (355 mL), one 5 oz glass of wine (148 mL), or one 1 oz glass of hard liquor (44 mL). General information Avoid eating more than 2,300 mg of salt a day. If you have hypertension, you may need to reduce your sodium intake to 1,500 mg a day. Work with your health care provider to maintain a healthy body weight or to lose weight. Ask what an ideal  weight is for you. Get at least 30 minutes of exercise that causes your heart to beat faster (aerobic exercise) most days of the week. Activities may include walking, swimming, or biking. Work with your health care provider or dietitian to adjust your eating plan to your individual calorie needs. What foods should I eat? Fruits All fresh, dried, or frozen fruit. Canned fruit in natural juice (without added sugar). Vegetables Fresh or frozen vegetables (raw, steamed, roasted, or grilled). Low-sodium or reduced-sodium tomato and vegetable juice. Low-sodium or reduced-sodium tomato  sauce and tomato paste. Low-sodium or reduced-sodium canned vegetables. Grains Whole-grain or whole-wheat bread. Whole-grain or whole-wheat pasta. Brown rice. Orpah Cobb. Bulgur. Whole-grain and low-sodium cereals. Pita bread. Low-fat, low-sodium crackers. Whole-wheat flour tortillas. Meats and other proteins Skinless chicken or [place]. Ground chicken or [place]. Pork with fat trimmed off. Fish and seafood. Egg whites. Dried beans, peas, or lentils. Unsalted nuts, nut butters, and seeds. Unsalted canned beans. Lean cuts of beef with fat trimmed off. Low-sodium, lean precooked or cured meat, such as sausages or meat loaves. Dairy Low-fat (1%) or fat-free (skim) milk. Reduced-fat, low-fat, or fat-free cheeses. Nonfat, low-sodium ricotta or cottage cheese. Low-fat or nonfat yogurt. Low-fat, low-sodium cheese. Fats and oils Soft margarine without trans fats. Vegetable oil. Reduced-fat, low-fat, or light mayonnaise and salad dressings (reduced-sodium). Canola, safflower, olive, avocado, soybean, and sunflower oils. Avocado. Seasonings and condiments Herbs. Spices. Seasoning mixes without salt. Other foods Unsalted popcorn and pretzels. Fat-free sweets. The items listed above may not be a complete list of foods and beverages you can eat. Contact a dietitian for more information. What foods should I avoid? Fruits Canned fruit in a light or heavy syrup. Fried fruit. Fruit in cream or butter sauce. Vegetables Creamed or fried vegetables. Vegetables in a cheese sauce. Regular canned vegetables (not low-sodium or reduced-sodium). Regular canned tomato sauce and paste (not low-sodium or reduced-sodium). Regular tomato and vegetable juice (not low-sodium or reduced-sodium). Rosita Fire. Olives. Grains Baked goods made with fat, such as croissants, muffins, or some breads. Dry pasta or rice meal packs. Meats and other proteins Fatty cuts of meat. Ribs. Fried meat. Tomasa Blase. Bologna, salami, and other precooked  or cured meats, such as sausages or meat loaves. Fat from the back of a pig (fatback). Bratwurst. Salted nuts and seeds. Canned beans with added salt. Canned or smoked fish. Whole eggs or egg yolks. Chicken or [place] with skin. Dairy Whole or 2% milk, cream, and half-and-half. Whole or full-fat cream cheese. Whole-fat or sweetened yogurt. Full-fat cheese. Nondairy creamers. Whipped toppings. Processed cheese and cheese spreads. Fats and oils Butter. Stick margarine. Lard. Shortening. Ghee. Bacon fat. Tropical oils, such as coconut, palm kernel, or palm oil. Seasonings and condiments Onion salt, garlic salt, seasoned salt, table salt, and sea salt. Worcestershire sauce. Tartar sauce. Barbecue sauce. Teriyaki sauce. Soy sauce, including reduced-sodium. Steak sauce. Canned and packaged gravies. Fish sauce. Oyster sauce. Cocktail sauce. Store-bought horseradish. Ketchup. Mustard. Meat flavorings and tenderizers. Bouillon cubes. Hot sauces. Pre-made or packaged marinades. Pre-made or packaged taco seasonings. Relishes. Regular salad dressings. Other foods Salted popcorn and pretzels. The items listed above may not be a complete list of foods and beverages you should avoid. Contact a dietitian for more information. Where to find more information National Heart, Lung, and Blood Institute: PopSteam.is American Heart Association: www.heart.org Academy of Nutrition and Dietetics: www.eatright.org National Kidney Foundation: www.kidney.org Summary The DASH eating plan is a healthy eating plan that has been shown to reduce high blood pressure (  hypertension). It may also reduce your risk for type 2 diabetes, heart disease, and stroke. When on the DASH eating plan, aim to eat more fresh fruits and vegetables, whole grains, lean proteins, low-fat dairy, and heart-healthy fats. With the DASH eating plan, you should limit salt (sodium) intake to 2,300 mg a day. If you have hypertension, you may need to reduce  your sodium intake to 1,500 mg a day. Work with your health care provider or dietitian to adjust your eating plan to your individual calorie needs. This information is not intended to replace advice given to you by your health care provider. Make sure you discuss any questions you have with your health care provider. Document Revised: 05/12/2019 Document Reviewed: 05/12/2019 Elsevier Patient Education  2023 [employer].

## 2022-01-28 NOTE — Assessment & Plan Note (Signed)
Family hx of early CAD. Check lipids. Work on diet, exercise and consider statin therapy if appropriate.

## 2022-01-28 NOTE — Progress Notes (Signed)
New Patient Office Visit  Subjective    Patient ID: Michael Hart, male    DOB: Feb 12, 1986  Age: 36 y.o. MRN: [id number]  CC:  Chief Complaint  Patient presents with   Transitions Of Care    BP has been high recently, went to urgent care for it. Will be needing a refill of amlodipine that was prescribed.   Hx of low testosterone    HPI Michael Hart presents to establish care  History of elevated blood pressures. He was prescribed amlodipine 5 mg but has not been taking it. He is reluctant to start on medication and would like to make sure he has HTN. BP at home has been in the 130s/100 range.   Hx of gout. 2-3 attacks per year.  Uses tart cherry pills.   Hx of low testosterone and has not been on testosterone therapy in the past.   Denies fever, chills, night sweats, fatigue, dizziness, chest pain, palpitations, shortness of breath, abdominal pain, N/V/D, urinary symptoms, LE edema.  Normal libido.    He used to take Effexor but has been off it since 2021.   Does not smoke.  Drinks alcohol socially    Father passed away in his 56s with lung cancer and had heart bypass at age 50. Father smoked.  Divorced.  Works at [employer]. Makes [employer].  Works shifts.         Outpatient Encounter Medications as of 01/28/2022  Medication Sig   cetirizine (ZYRTEC) 10 MG tablet Take 10 mg by mouth as needed.   amLODipine (NORVASC) 5 MG tablet Take 1 tablet by mouth daily. (Patient not taking: Reported on 01/28/2022)   rizatriptan (MAXALT-MLT) 10 MG disintegrating tablet Dissolve 1 tablet (10 mg total) by mouth as needed for migraine. May repeat in 2 hours if needed (Patient not taking: Reported on 01/28/2022)   [DISCONTINUED] ondansetron (ZOFRAN-ODT) 4 MG disintegrating tablet Dissolve 1-2 tablets (4-8 mg total) by mouth every 8 (eight) hours as needed. (Patient not taking: Reported on 01/28/2022)   No facility-administered encounter medications on file as of  01/28/2022.    Past Medical History:  Diagnosis Date   Allergy    Asthma    Gout    Kidney stone    Panic attacks    nocturnal, controls with self directed biofeedback    Past Surgical History:  Procedure Laterality Date   NO PAST SURGERIES      Family History  Problem Relation Age of Onset   Breast cancer Mother    Lung cancer Father    Heart disease Father        CABG   COPD Father    Hypertension Other    Stroke Other    Migraines Neg Hx    Headache Neg Hx    Sleep apnea Neg Hx     Social History   Socioeconomic History   Marital status: Single    Spouse name: Not on file   Number of children: Not on file   Years of education: Not on file   Highest education level: Not on file  Occupational History   Not on file  Tobacco Use   Smoking status: Former   Smokeless tobacco: Former    Quit date: 06/23/2011  Substance and Sexual Activity   Alcohol use: Yes    Alcohol/week: 2.0 standard drinks of alcohol    Types: 2 Glasses of wine per week    Comment: occasional   Drug  use: No   Sexual activity: Not on file  Other Topics Concern   Not on file  Social History Narrative   Married, lives with spouse   Engineer, maintenance (IT), works in Furniture conservator/restorer   Social Determinants of Corporate investment banker Strain: Not on file  Food Insecurity: Not on file  Transportation Needs: Not on file  Physical Activity: Not on file  Stress: Not on file  Social Connections: Not on file  Intimate Partner Violence: Not on file    ROS      Objective    BP (!) 148/100 (BP Location: Left Arm, Patient Position: Sitting, Cuff Size: Large) Comment: didnt take BP meds  Pulse 75   Temp 97.8 F (36.6 C) (Temporal)   Ht 5\' 10"  (1.778 m)   Wt 249 lb (112.9 kg)   SpO2 97%   BMI 35.73 kg/m   Physical Exam Constitutional:      General: He is not in acute distress.    Appearance: He is not ill-appearing.  Cardiovascular:     Rate and Rhythm: Normal rate and regular rhythm.   Pulmonary:     Effort: Pulmonary effort is normal.     Breath sounds: Normal breath sounds.  Musculoskeletal:     Right lower leg: No edema.     Left lower leg: No edema.  Skin:    General: Skin is warm and dry.  Neurological:     General: No focal deficit present.     Mental Status: He is alert and oriented to person, place, and time.  Psychiatric:        Mood and Affect: Mood normal.        Behavior: Behavior normal.         Assessment & Plan:   Problem List Items Addressed This Visit       Other   Chronic gout without tophus    2-3 flares per year. Prefers to not take medication daily for gout at this point. Recommend low purine diet.  Check uric acid.       Relevant Orders   Uric acid (Completed)   Elevated blood pressure reading in office without diagnosis of hypertension - Primary    Most likely needs to start HTN therapy. He is reluctant. Recommend f/u in 4 weeks with BP readings from home. Check labs and follow up. DASH diet hand out provided.       Relevant Orders   CBC with Differential/Platelet (Completed)   Comprehensive metabolic panel (Completed)   TSH (Completed)   T4, free (Completed)   Family history of early CAD    Discussed risk factors for heart disease. Check labs including cholesterol. Discussed importance of good BP control. Discussed healthy diet and exercise. He may need a cardiac work up in the next few years due to family history.       Relevant Orders   Lipid panel (Completed)   Low testosterone   Relevant Orders   Comprehensive metabolic panel (Completed)   TSH (Completed)   Testosterone , Free and Total   Mixed dyslipidemia    Family hx of early CAD. Check lipids. Work on diet, exercise and consider statin therapy if appropriate.       Relevant Orders   Lipid panel (Completed)   Obesity (BMI 30-39.9)    Diet and exercise to help with weight loss.        Return in about 4 weeks (around 02/25/2022) for HTN .   04/27/2022,  NP-C

## 2022-01-28 NOTE — Assessment & Plan Note (Signed)
Diet and exercise to help with weight loss.

## 2022-01-28 NOTE — Assessment & Plan Note (Addendum)
2-3 flares per year. Prefers to not take medication daily for gout at this point. Recommend low purine diet.  Check uric acid.

## 2022-01-28 NOTE — Assessment & Plan Note (Addendum)
Discussed risk factors for heart disease. Check labs including cholesterol. Discussed importance of good BP control. Discussed healthy diet and exercise. He may need a cardiac work up in the next few years due to family history.

## 2022-01-28 NOTE — Assessment & Plan Note (Signed)
Most likely needs to start HTN therapy. He is reluctant. Recommend f/u in 4 weeks with BP readings from home. Check labs and follow up. DASH diet hand out provided.

## 2022-02-04 NOTE — Progress Notes (Signed)
Please call him since he has been waiting on his lab results and let him know that his testosterone level was not drawn the day he had lab work.  I recommend that he come in one morning between 8 AM and 10 AM to get this blood test done. I sent him a mychart message as well. Thanks.

## 2022-02-06 ENCOUNTER — Other Ambulatory Visit: Payer: Self-pay

## 2022-02-06 DIAGNOSIS — R7989 Other specified abnormal findings of blood chemistry: Secondary | ICD-10-CM

## 2022-02-20 ENCOUNTER — Other Ambulatory Visit (HOSPITAL_COMMUNITY): Payer: Self-pay

## 2022-03-02 ENCOUNTER — Other Ambulatory Visit: Payer: 59

## 2022-03-02 DIAGNOSIS — R7989 Other specified abnormal findings of blood chemistry: Secondary | ICD-10-CM

## 2022-03-03 ENCOUNTER — Encounter: Payer: Self-pay | Admitting: Family Medicine

## 2022-03-03 ENCOUNTER — Ambulatory Visit: Payer: 59 | Admitting: Family Medicine

## 2022-03-03 ENCOUNTER — Other Ambulatory Visit (HOSPITAL_COMMUNITY): Payer: Self-pay

## 2022-03-03 VITALS — BP 142/92 | HR 80 | Temp 97.8°F | Ht 70.0 in | Wt 246.0 lb

## 2022-03-03 DIAGNOSIS — E669 Obesity, unspecified: Secondary | ICD-10-CM

## 2022-03-03 DIAGNOSIS — E781 Pure hyperglyceridemia: Secondary | ICD-10-CM

## 2022-03-03 DIAGNOSIS — I10 Essential (primary) hypertension: Secondary | ICD-10-CM | POA: Diagnosis not present

## 2022-03-03 DIAGNOSIS — M25571 Pain in right ankle and joints of right foot: Secondary | ICD-10-CM

## 2022-03-03 DIAGNOSIS — M1A9XX0 Chronic gout, unspecified, without tophus (tophi): Secondary | ICD-10-CM

## 2022-03-03 DIAGNOSIS — E782 Mixed hyperlipidemia: Secondary | ICD-10-CM | POA: Diagnosis not present

## 2022-03-03 DIAGNOSIS — Z8249 Family history of ischemic heart disease and other diseases of the circulatory system: Secondary | ICD-10-CM

## 2022-03-03 HISTORY — DX: Essential (primary) hypertension: I10

## 2022-03-03 MED ORDER — ROSUVASTATIN CALCIUM 5 MG PO TABS
5.0000 mg | ORAL_TABLET | Freq: Every day | ORAL | 0 refills | Status: DC
  Filled 2022-03-03: qty 90, 90d supply, fill #0

## 2022-03-03 MED ORDER — AMLODIPINE BESYLATE 5 MG PO TABS
5.0000 mg | ORAL_TABLET | Freq: Every day | ORAL | 2 refills | Status: DC
  Filled 2022-03-03: qty 30, 30d supply, fill #0

## 2022-03-03 MED ORDER — ALLOPURINOL 100 MG PO TABS
100.0000 mg | ORAL_TABLET | Freq: Every day | ORAL | 5 refills | Status: DC
  Filled 2022-03-03: qty 30, 30d supply, fill #0
  Filled 2022-03-31: qty 30, 30d supply, fill #1

## 2022-03-03 NOTE — Assessment & Plan Note (Signed)
Uric acid level 9.3 4 weeks ago. Start on allopurinol. Recheck in 6 wks.

## 2022-03-03 NOTE — Assessment & Plan Note (Signed)
Discussed 10 yr ASCVD risk is 4.3%. encourage healthy diet and exercise.  Prefers to start on statin to reduce his overall risk due to consistently elevated LDL and trigs. Crestor 5 mg sent to pharmacy. Follow up in 6 wks for office visit and to check fasting lipids.  

## 2022-03-03 NOTE — Assessment & Plan Note (Signed)
Question arthritis vs gout. Pain relieved with NSAID. Start allopurinol.

## 2022-03-03 NOTE — Patient Instructions (Signed)
Start on amlodipine 5 mg daily.   Start on allopurinol 100 mg daily.   Start on rosuvastatin (Crestor) 5 mg daily.   Follow up in 6 weeks with me (FASTING).     Mediterranean Diet A Mediterranean diet refers to food and lifestyle choices that are based on the traditions of countries located on the [employer] [place]. It focuses on eating more fruits, vegetables, whole grains, beans, nuts, seeds, and heart-healthy fats, and eating less dairy, meat, eggs, and processed foods with added sugar, salt, and fat. This way of eating has been shown to help prevent certain conditions and improve outcomes for people who have chronic diseases, like kidney disease and heart disease. What are tips for following this plan? Reading food labels Check the serving size of packaged foods. For foods such as rice and pasta, the serving size refers to the amount of cooked product, not dry. Check the total fat in packaged foods. Avoid foods that have saturated fat or trans fats. Check the ingredient list for added sugars, such as corn syrup. Shopping  Buy a variety of foods that offer a balanced diet, including: Fresh fruits and vegetables (produce). Grains, beans, nuts, and seeds. Some of these may be available in unpackaged forms or large amounts (in bulk). Fresh seafood. Poultry and eggs. Low-fat dairy products. Buy whole ingredients instead of prepackaged foods. Buy fresh fruits and vegetables in-season from local farmers markets. Buy plain frozen fruits and vegetables. If you do not have access to quality fresh seafood, buy precooked frozen shrimp or canned fish, such as tuna, salmon, or sardines. Stock your pantry so you always have certain foods on hand, such as olive oil, canned tuna, canned tomatoes, rice, pasta, and beans. Cooking Cook foods with extra-virgin olive oil instead of using butter or other vegetable oils. Have meat as a side dish, and have vegetables or grains as your main dish. This  means having meat in small portions or adding small amounts of meat to foods like pasta or stew. Use beans or vegetables instead of meat in common dishes like chili or lasagna. Experiment with different cooking methods. Try roasting, broiling, steaming, and sauting vegetables. Add frozen vegetables to soups, stews, pasta, or rice. Add nuts or seeds for added healthy fats and plant protein at each meal. You can add these to yogurt, salads, or vegetable dishes. Marinate fish or vegetables using olive oil, lemon juice, garlic, and fresh herbs. Meal planning Plan to eat one vegetarian meal one day each week. Try to work up to two vegetarian meals, if possible. Eat seafood two or more times a week. Have healthy snacks readily available, such as: Vegetable sticks with hummus. Greek yogurt. Fruit and nut trail mix. Eat balanced meals throughout the week. This includes: Fruit: 2-3 servings a day. Vegetables: 4-5 servings a day. Low-fat dairy: 2 servings a day. Fish, poultry, or lean meat: 1 serving a day. Beans and legumes: 2 or more servings a week. Nuts and seeds: 1-2 servings a day. Whole grains: 6-8 servings a day. Extra-virgin olive oil: 3-4 servings a day. Limit red meat and sweets to only a few servings a month. Lifestyle  Cook and eat meals together with your family, when possible. Drink enough fluid to keep your urine pale yellow. Be physically active every day. This includes: Aerobic exercise like running or swimming. Leisure activities like gardening, walking, or housework. Get 7-8 hours of sleep each night. If recommended by your health care provider, drink red wine in moderation. This  means 1 glass a day for nonpregnant women and 2 glasses a day for men. A glass of wine equals 5 oz (150 mL). What foods should I eat? Fruits Apples. Apricots. Avocado. Berries. Bananas. Cherries. Dates. Figs. Grapes. Lemons. Melon. Oranges. Peaches. Plums. Pomegranate. Vegetables Artichokes.  Beets. Broccoli. Cabbage. Carrots. Eggplant. Green beans. Chard. Kale. Spinach. Onions. Leeks. Peas. Squash. Tomatoes. Peppers. Radishes. Grains Whole-grain pasta. Brown rice. Bulgur wheat. Polenta. Couscous. Whole-wheat bread. Orpah Cobb. Meats and other proteins Beans. Almonds. Sunflower seeds. Pine nuts. Peanuts. Cod. Salmon. Scallops. Shrimp. Tuna. Tilapia. Clams. Oysters. Eggs. Poultry without skin. Dairy Low-fat milk. Cheese. Greek yogurt. Fats and oils Extra-virgin olive oil. Avocado oil. Grapeseed oil. Beverages Water. Red wine. Herbal tea. Sweets and desserts Greek yogurt with honey. Baked apples. Poached pears. Trail mix. Seasonings and condiments Basil. Cilantro. Coriander. Cumin. Mint. Parsley. Sage. Rosemary. Tarragon. Garlic. Oregano. Thyme. Pepper. Balsamic vinegar. Tahini. Hummus. Tomato sauce. Olives. Mushrooms. The items listed above may not be a complete list of foods and beverages you can eat. Contact a dietitian for more information. What foods should I limit? This is a list of foods that should be eaten rarely or only on special occasions. Fruits Fruit canned in syrup. Vegetables Deep-fried potatoes (french fries). Grains Prepackaged pasta or rice dishes. Prepackaged cereal with added sugar. Prepackaged snacks with added sugar. Meats and other proteins Beef. Pork. Lamb. Poultry with skin. Hot dogs. Tomasa Blase. Dairy Ice cream. Sour cream. Whole milk. Fats and oils Butter. Canola oil. Vegetable oil. Beef fat (tallow). Lard. Beverages Juice. Sugar-sweetened soft drinks. Beer. Liquor and spirits. Sweets and desserts Cookies. Cakes. Pies. Candy. Seasonings and condiments Mayonnaise. Pre-made sauces and marinades. The items listed above may not be a complete list of foods and beverages you should limit. Contact a dietitian for more information. Summary The Mediterranean diet includes both food and lifestyle choices. Eat a variety of fresh fruits and vegetables,  beans, nuts, seeds, and whole grains. Limit the amount of red meat and sweets that you eat. If recommended by your health care provider, drink red wine in moderation. This means 1 glass a day for nonpregnant women and 2 glasses a day for men. A glass of wine equals 5 oz (150 mL). This information is not intended to replace advice given to you by your health care provider. Make sure you discuss any questions you have with your health care provider. Document Revised: 07/14/2019 Document Reviewed: 05/11/2019 Elsevier Patient Education  2023 [employer].

## 2022-03-03 NOTE — Progress Notes (Signed)
Subjective:     Patient ID: Michael Hart, male    DOB: 01-28-1986, 36 y.o.   MRN: [id number]  Chief Complaint  Patient presents with   Follow-up    4 week f/u for BP, brought BP cuff from home states BP has been ranging in the 130s/90s    HPI Patient is in today for 4 wk f/u on HTN, gout and HL.   BP at home has been in the 130s/90s.  He has not started on medication and was waiting for our follow up visit today.   Gout- uric acid level was 9.3 4 wks ago.   HL- LDL 151, trigs 370, HDL 32 Family hx of early CAD  Right lateral ankle pain started today. Hx of same and injury to his right ankle. He has seen sports medicine in the past. Took NSAID and it helped.   Denies fever, chills, dizziness, chest pain, palpitations, shortness of breath, abdominal pain, N/V/D, urinary symptoms, LE edema.    There are no preventive care reminders to display for this patient.   Past Medical History:  Diagnosis Date   Allergy    Asthma    Gout    Kidney stone    Panic attacks    nocturnal, controls with self directed biofeedback   Primary hypertension 03/03/2022    Past Surgical History:  Procedure Laterality Date   NO PAST SURGERIES      Family History  Problem Relation Age of Onset   Breast cancer Mother    Lung cancer Father    Heart disease Father        CABG   COPD Father    Hypertension Other    Stroke Other    Migraines Neg Hx    Headache Neg Hx    Sleep apnea Neg Hx     Social History   Socioeconomic History   Marital status: Single    Spouse name: Not on file   Number of children: Not on file   Years of education: Not on file   Highest education level: Not on file  Occupational History   Not on file  Tobacco Use   Smoking status: Former   Smokeless tobacco: Former    Quit date: 06/23/2011  Substance and Sexual Activity   Alcohol use: Yes    Alcohol/week: 2.0 standard drinks of alcohol    Types: 2 Glasses of wine per week    Comment: occasional    Drug use: No   Sexual activity: Not on file  Other Topics Concern   Not on file  Social History Narrative   Married, lives with spouse   Engineer, maintenance (IT), works in Furniture conservator/restorer   Social Determinants of Corporate investment banker Strain: Not on file  Food Insecurity: Not on file  Transportation Needs: Not on file  Physical Activity: Not on file  Stress: Not on file  Social Connections: Not on file  Intimate Partner Violence: Not on file    Outpatient Medications Prior to Visit  Medication Sig Dispense Refill   cetirizine (ZYRTEC) 10 MG tablet Take 10 mg by mouth as needed.     rizatriptan (MAXALT-MLT) 10 MG disintegrating tablet Dissolve 1 tablet (10 mg total) by mouth as needed for migraine. May repeat in 2 hours if needed 9 tablet 11   amLODipine (NORVASC) 5 MG tablet Take 1 tablet by mouth daily. (Patient not taking: Reported on 01/28/2022) 30 tablet 1   No facility-administered medications prior to visit.  Allergies  Allergen Reactions   Penicillins    Sulfa Antibiotics     ROS     Objective:    Physical Exam Constitutional:      General: He is not in acute distress.    Appearance: He is not ill-appearing.  Cardiovascular:     Rate and Rhythm: Normal rate.  Pulmonary:     Effort: Pulmonary effort is normal.  Musculoskeletal:     Right lower leg: No edema.     Left lower leg: No edema.     Right ankle: No swelling. Tenderness present over the lateral malleolus. Normal range of motion. Normal pulse.  Skin:    General: Skin is warm and dry.  Neurological:     General: No focal deficit present.     Mental Status: He is alert and oriented to person, place, and time.     Cranial Nerves: No cranial nerve deficit.     Motor: No weakness.     Gait: Gait normal.  Psychiatric:        Mood and Affect: Mood normal.        Behavior: Behavior normal.     BP (!) 142/92 (BP Location: Left Arm, Patient Position: Sitting, Cuff Size: Large)   Pulse 80   Temp  97.8 F (36.6 C) (Temporal)   Ht 5\' 10"  (1.778 m)   Wt 246 lb (111.6 kg)   SpO2 97%   BMI 35.30 kg/m  Wt Readings from Last 3 Encounters:  03/03/22 246 lb (111.6 kg)  01/28/22 249 lb (112.9 kg)  11/24/21 241 lb 6.4 oz (109.5 kg)       Assessment & Plan:   Problem List Items Addressed This Visit       Cardiovascular and Mediastinum   Primary hypertension - Primary    4 wks of monitoring BP at home and all readings have been out of goal range.  He will start on amlodipine 5 mg. Low sodium diet.  Follow up in 6 wks       Relevant Medications   amLODipine (NORVASC) 5 MG tablet   rosuvastatin (CRESTOR) 5 MG tablet     Other   Acute right ankle pain    Question arthritis vs gout. Pain relieved with NSAID. Start allopurinol.       Chronic gout without tophus    Uric acid level 9.3 4 weeks ago. Start on allopurinol. Recheck in 6 wks.       Relevant Medications   allopurinol (ZYLOPRIM) 100 MG tablet   Family history of early CAD    Prefers to start on statin to reduce his overall risk due to consistently elevated LDL and trigs. Crestor 5 mg sent to pharmacy. Follow up in 6 wks for office visit and to check fasting lipids.       Relevant Medications   rosuvastatin (CRESTOR) 5 MG tablet   Hypertriglyceridemia    Discussed 10 yr ASCVD risk is 4.3%. encourage healthy diet and exercise.  Prefers to start on statin to reduce his overall risk due to consistently elevated LDL and trigs. Crestor 5 mg sent to pharmacy. Follow up in 6 wks for office visit and to check fasting lipids.       Relevant Medications   amLODipine (NORVASC) 5 MG tablet   rosuvastatin (CRESTOR) 5 MG tablet   Mixed dyslipidemia    Discussed 10 yr ASCVD risk is 4.3%. encourage healthy diet and exercise.  Prefers to start on statin to reduce his overall risk  due to consistently elevated LDL and trigs. Crestor 5 mg sent to pharmacy. Follow up in 6 wks for office visit and to check fasting lipids.        Relevant Medications   rosuvastatin (CRESTOR) 5 MG tablet   Obesity (BMI 30-39.9)    I am having Deanna Artis. Moura "Hense" start on allopurinol and rosuvastatin. I am also having him maintain his cetirizine, rizatriptan, and amLODipine.  Meds ordered this encounter  Medications   amLODipine (NORVASC) 5 MG tablet    Sig: Take 1 tablet by mouth daily.    Dispense:  30 tablet    Refill:  2    Order Specific Question:   Supervising Provider    Answer:   Hillard Danker A [[id number]]   allopurinol (ZYLOPRIM) 100 MG tablet    Sig: Take 1 tablet (100 mg total) by mouth daily.    Dispense:  30 tablet    Refill:  5    Order Specific Question:   Supervising Provider    Answer:   Hillard Danker A [[id number]]   rosuvastatin (CRESTOR) 5 MG tablet    Sig: Take 1 tablet (5 mg total) by mouth daily.    Dispense:  90 tablet    Refill:  0    Order Specific Question:   Supervising Provider    Answer:   Hillard Danker A [[id number]]

## 2022-03-03 NOTE — Assessment & Plan Note (Signed)
4 wks of monitoring BP at home and all readings have been out of goal range.  He will start on amlodipine 5 mg. Low sodium diet.  Follow up in 6 wks

## 2022-03-03 NOTE — Assessment & Plan Note (Signed)
Prefers to start on statin to reduce his overall risk due to consistently elevated LDL and trigs. Crestor 5 mg sent to pharmacy. Follow up in 6 wks for office visit and to check fasting lipids.

## 2022-03-03 NOTE — Assessment & Plan Note (Signed)
Discussed 10 yr ASCVD risk is 4.3%. encourage healthy diet and exercise.  Prefers to start on statin to reduce his overall risk due to consistently elevated LDL and trigs. Crestor 5 mg sent to pharmacy. Follow up in 6 wks for office visit and to check fasting lipids.

## 2022-03-07 ENCOUNTER — Other Ambulatory Visit (HOSPITAL_COMMUNITY): Payer: Self-pay

## 2022-03-09 LAB — TESTOSTERONE,FREE AND TOTAL
Testosterone, Free: 4.4 pg/mL — ABNORMAL LOW (ref 8.7–25.1)
Testosterone: 201 ng/dL — ABNORMAL LOW (ref 264–916)

## 2022-03-21 ENCOUNTER — Encounter (HOSPITAL_COMMUNITY): Payer: Self-pay

## 2022-03-21 ENCOUNTER — Inpatient Hospital Stay (HOSPITAL_COMMUNITY): Payer: 59

## 2022-03-21 ENCOUNTER — Encounter (HOSPITAL_COMMUNITY)
Admission: EM | Disposition: A | Discharge status: Home / Self Care | Payer: Self-pay | Source: Home / Self Care | Attending: Cardiology

## 2022-03-21 ENCOUNTER — Inpatient Hospital Stay (HOSPITAL_COMMUNITY)
Admission: EM | Admit: 2022-03-21 | Discharge: 2022-03-31 | DRG: 232 | Disposition: A | Discharge status: Home / Self Care | Payer: 59 | Attending: Thoracic Surgery (Cardiothoracic Vascular Surgery) | Admitting: Thoracic Surgery (Cardiothoracic Vascular Surgery)

## 2022-03-21 ENCOUNTER — Emergency Department (HOSPITAL_COMMUNITY): Payer: 59

## 2022-03-21 ENCOUNTER — Other Ambulatory Visit: Payer: Self-pay

## 2022-03-21 DIAGNOSIS — R000 Tachycardia, unspecified: Secondary | ICD-10-CM | POA: Diagnosis not present

## 2022-03-21 DIAGNOSIS — Z20822 Contact with and (suspected) exposure to covid-19: Secondary | ICD-10-CM | POA: Diagnosis present

## 2022-03-21 DIAGNOSIS — Z87891 Personal history of nicotine dependence: Secondary | ICD-10-CM | POA: Diagnosis not present

## 2022-03-21 DIAGNOSIS — I2109 ST elevation (STEMI) myocardial infarction involving other coronary artery of anterior wall: Secondary | ICD-10-CM | POA: Diagnosis present

## 2022-03-21 DIAGNOSIS — Z955 Presence of coronary angioplasty implant and graft: Secondary | ICD-10-CM

## 2022-03-21 DIAGNOSIS — I252 Old myocardial infarction: Secondary | ICD-10-CM

## 2022-03-21 DIAGNOSIS — E782 Mixed hyperlipidemia: Secondary | ICD-10-CM

## 2022-03-21 DIAGNOSIS — I2510 Atherosclerotic heart disease of native coronary artery without angina pectoris: Secondary | ICD-10-CM | POA: Diagnosis present

## 2022-03-21 DIAGNOSIS — I110 Hypertensive heart disease with heart failure: Secondary | ICD-10-CM | POA: Diagnosis present

## 2022-03-21 DIAGNOSIS — E871 Hypo-osmolality and hyponatremia: Secondary | ICD-10-CM | POA: Diagnosis present

## 2022-03-21 DIAGNOSIS — F410 Panic disorder [episodic paroxysmal anxiety] without agoraphobia: Secondary | ICD-10-CM | POA: Diagnosis present

## 2022-03-21 DIAGNOSIS — I213 ST elevation (STEMI) myocardial infarction of unspecified site: Secondary | ICD-10-CM

## 2022-03-21 DIAGNOSIS — M109 Gout, unspecified: Secondary | ICD-10-CM | POA: Diagnosis present

## 2022-03-21 DIAGNOSIS — I5020 Unspecified systolic (congestive) heart failure: Secondary | ICD-10-CM

## 2022-03-21 DIAGNOSIS — Z8249 Family history of ischemic heart disease and other diseases of the circulatory system: Secondary | ICD-10-CM | POA: Diagnosis not present

## 2022-03-21 DIAGNOSIS — Z79899 Other long term (current) drug therapy: Secondary | ICD-10-CM

## 2022-03-21 DIAGNOSIS — Z880 Allergy status to penicillin: Secondary | ICD-10-CM | POA: Diagnosis not present

## 2022-03-21 DIAGNOSIS — I2102 ST elevation (STEMI) myocardial infarction involving left anterior descending coronary artery: Secondary | ICD-10-CM

## 2022-03-21 DIAGNOSIS — N35912 Unspecified bulbous urethral stricture, male: Secondary | ICD-10-CM | POA: Diagnosis present

## 2022-03-21 DIAGNOSIS — R197 Diarrhea, unspecified: Secondary | ICD-10-CM | POA: Diagnosis not present

## 2022-03-21 DIAGNOSIS — I10 Essential (primary) hypertension: Secondary | ICD-10-CM | POA: Diagnosis not present

## 2022-03-21 DIAGNOSIS — Z881 Allergy status to other antibiotic agents status: Secondary | ICD-10-CM

## 2022-03-21 DIAGNOSIS — Z01818 Encounter for other preprocedural examination: Secondary | ICD-10-CM | POA: Diagnosis not present

## 2022-03-21 DIAGNOSIS — Z951 Presence of aortocoronary bypass graft: Secondary | ICD-10-CM

## 2022-03-21 DIAGNOSIS — I2101 ST elevation (STEMI) myocardial infarction involving left main coronary artery: Secondary | ICD-10-CM | POA: Diagnosis not present

## 2022-03-21 HISTORY — PX: CORONARY/GRAFT ACUTE MI REVASCULARIZATION: CATH118305

## 2022-03-21 LAB — CBC
HCT: 42.9 % (ref 39.0–52.0)
HCT: 40.6 % (ref 39.0–52.0)
Hemoglobin: 15.3 g/dL (ref 13.0–17.0)
Hemoglobin: 15.1 g/dL (ref 13.0–17.0)
MCH: 30.9 pg (ref 26.0–34.0)
MCH: 31.7 pg (ref 26.0–34.0)
MCHC: 35.7 g/dL (ref 30.0–36.0)
MCHC: 37.2 g/dL — ABNORMAL HIGH (ref 30.0–36.0)
MCV: 86.7 fL (ref 80.0–100.0)
MCV: 85.1 fL (ref 80.0–100.0)
Platelets: 298 K/uL (ref 150–400)
Platelets: 286 10*3/uL (ref 150–400)
RBC: 4.95 MIL/uL (ref 4.22–5.81)
RBC: 4.77 MIL/uL (ref 4.22–5.81)
RDW: 11.8 % (ref 11.5–15.5)
RDW: 11.7 % (ref 11.5–15.5)
WBC: 13.2 K/uL — ABNORMAL HIGH (ref 4.0–10.5)
WBC: 13.7 10*3/uL — ABNORMAL HIGH (ref 4.0–10.5)
nRBC: 0 % (ref 0.0–0.2)
nRBC: 0 % (ref 0.0–0.2)

## 2022-03-21 LAB — RESP PANEL BY RT-PCR (FLU A&B, COVID) ARPGX2
Influenza A by PCR: NEGATIVE
Influenza B by PCR: NEGATIVE
SARS Coronavirus 2 by RT PCR: NEGATIVE

## 2022-03-21 LAB — CBC WITH DIFFERENTIAL/PLATELET
Abs Immature Granulocytes: 0.05 10*3/uL (ref 0.00–0.07)
Basophils Absolute: 0.1 10*3/uL (ref 0.0–0.1)
Basophils Relative: 1 %
Eosinophils Absolute: 0.2 10*3/uL (ref 0.0–0.5)
Eosinophils Relative: 2 %
HCT: 41.6 % (ref 39.0–52.0)
Hemoglobin: 14.8 g/dL (ref 13.0–17.0)
Immature Granulocytes: 1 %
Lymphocytes Relative: 43 %
Lymphs Abs: 4.3 10*3/uL — ABNORMAL HIGH (ref 0.7–4.0)
MCH: 30.9 pg (ref 26.0–34.0)
MCHC: 35.6 g/dL (ref 30.0–36.0)
MCV: 86.8 fL (ref 80.0–100.0)
Monocytes Absolute: 1.1 10*3/uL — ABNORMAL HIGH (ref 0.1–1.0)
Monocytes Relative: 10 %
Neutro Abs: 4.4 10*3/uL (ref 1.7–7.7)
Neutrophils Relative %: 43 %
Platelets: 336 10*3/uL (ref 150–400)
RBC: 4.79 MIL/uL (ref 4.22–5.81)
RDW: 11.8 % (ref 11.5–15.5)
WBC: 10.2 10*3/uL (ref 4.0–10.5)
nRBC: 0 % (ref 0.0–0.2)

## 2022-03-21 LAB — POCT I-STAT, CHEM 8
BUN: 15 mg/dL (ref 6–20)
Calcium, Ion: 1.16 mmol/L (ref 1.15–1.40)
Chloride: 104 mmol/L (ref 98–111)
Creatinine, Ser: 0.8 mg/dL (ref 0.61–1.24)
Glucose, Bld: 155 mg/dL — ABNORMAL HIGH (ref 70–99)
HCT: 39 % (ref 39.0–52.0)
Hemoglobin: 13.3 g/dL (ref 13.0–17.0)
Potassium: 3.4 mmol/L — ABNORMAL LOW (ref 3.5–5.1)
Sodium: 139 mmol/L (ref 135–145)
TCO2: 21 mmol/L — ABNORMAL LOW (ref 22–32)

## 2022-03-21 LAB — BASIC METABOLIC PANEL
Anion gap: 12 (ref 5–15)
BUN: 13 mg/dL (ref 6–20)
CO2: 22 mmol/L (ref 22–32)
Calcium: 9.5 mg/dL (ref 8.9–10.3)
Chloride: 101 mmol/L (ref 98–111)
Creatinine, Ser: 1.05 mg/dL (ref 0.61–1.24)
GFR, Estimated: >60 mL/min (ref >60)
Glucose, Bld: 136 mg/dL — ABNORMAL HIGH (ref 70–99)
Potassium: 4 mmol/L (ref 3.5–5.1)
Sodium: 135 mmol/L (ref 135–145)

## 2022-03-21 LAB — APTT: aPTT: 24 seconds (ref 24–36)

## 2022-03-21 LAB — ECHOCARDIOGRAM COMPLETE
AR max vel: 2.84 cm2
AV Area VTI: 2.7 cm2
AV Area mean vel: 2.66 cm2
AV Mean grad: 3 mmHg
AV Peak grad: 5.2 mmHg
Ao pk vel: 1.14 m/s
Area-P 1/2: 3.37 cm2
Calc EF: 46.4 %
Height: 70 in
S' Lateral: 3.2 cm
Single Plane A2C EF: 45 %
Single Plane A4C EF: 42.5 %
Weight: 3996.5 [oz_av]

## 2022-03-21 LAB — COMPREHENSIVE METABOLIC PANEL
ALT: 44 U/L (ref 0–44)
AST: 39 U/L (ref 15–41)
Albumin: 3.9 g/dL (ref 3.5–5.0)
Alkaline Phosphatase: 37 U/L — ABNORMAL LOW (ref 38–126)
Anion gap: 14 (ref 5–15)
BUN: 17 mg/dL (ref 6–20)
CO2: 21 mmol/L — ABNORMAL LOW (ref 22–32)
Calcium: 9.5 mg/dL (ref 8.9–10.3)
Chloride: 104 mmol/L (ref 98–111)
Creatinine, Ser: 1.16 mg/dL (ref 0.61–1.24)
GFR, Estimated: >60 mL/min (ref >60)
Glucose, Bld: 137 mg/dL — ABNORMAL HIGH (ref 70–99)
Potassium: 3.5 mmol/L (ref 3.5–5.1)
Sodium: 139 mmol/L (ref 135–145)
Total Bilirubin: 0.6 mg/dL (ref 0.3–1.2)
Total Protein: 6.7 g/dL (ref 6.5–8.1)

## 2022-03-21 LAB — BRAIN NATRIURETIC PEPTIDE: B Natriuretic Peptide: 10.5 pg/mL (ref 0.0–100.0)

## 2022-03-21 LAB — LIPID PANEL
Cholesterol: 181 mg/dL (ref 0–200)
HDL: 29 mg/dL — ABNORMAL LOW (ref >40)
LDL Cholesterol: UNDETERMINED mg/dL (ref 0–99)
Total CHOL/HDL Ratio: 6.2 RATIO
Triglycerides: 432 mg/dL — ABNORMAL HIGH (ref <150)
VLDL: UNDETERMINED mg/dL (ref 0–40)

## 2022-03-21 LAB — PROTIME-INR
INR: 1 (ref 0.8–1.2)
Prothrombin Time: 13.4 seconds (ref 11.4–15.2)

## 2022-03-21 LAB — MRSA NEXT GEN BY PCR, NASAL: MRSA by PCR Next Gen: NOT DETECTED

## 2022-03-21 LAB — TROPONIN I (HIGH SENSITIVITY)
Troponin I (High Sensitivity): 129 ng/L (ref <18)
Troponin I (High Sensitivity): >24000 ng/L (ref <18)

## 2022-03-21 LAB — POCT ACTIVATED CLOTTING TIME
Activated Clotting Time: 588 seconds
Activated Clotting Time: 600 seconds
Activated Clotting Time: 251 seconds

## 2022-03-21 LAB — HEMOGLOBIN A1C
Hgb A1c MFr Bld: 5.2 % (ref 4.8–5.6)
Mean Plasma Glucose: 102.54 mg/dL

## 2022-03-21 LAB — GLUCOSE, CAPILLARY: Glucose-Capillary: 127 mg/dL — ABNORMAL HIGH (ref 70–99)

## 2022-03-21 LAB — LDL CHOLESTEROL, DIRECT: Direct LDL: 109 mg/dL — ABNORMAL HIGH (ref 0–99)

## 2022-03-21 SURGERY — CORONARY/GRAFT ACUTE MI REVASCULARIZATION
Anesthesia: LOCAL

## 2022-03-21 MED ORDER — TIROFIBAN HCL IN NACL 5-0.9 MG/100ML-% IV SOLN
0.1500 ug/kg/min | INTRAVENOUS | Status: AC
  Administered 2022-03-21 (×3): 0.15 ug/kg/min via INTRAVENOUS
  Filled 2022-03-21 (×3): qty 100

## 2022-03-21 MED ORDER — ROSUVASTATIN CALCIUM 20 MG PO TABS
20.0000 mg | ORAL_TABLET | Freq: Every day | ORAL | Status: DC
  Administered 2022-03-21 – 2022-03-31 (×10): 20 mg via ORAL
  Filled 2022-03-21 (×10): qty 1

## 2022-03-21 MED ORDER — ONDANSETRON HCL 4 MG/2ML IJ SOLN
4.0000 mg | Freq: Four times a day (QID) | INTRAMUSCULAR | Status: DC | PRN
  Administered 2022-03-21: 4 mg via INTRAVENOUS
  Filled 2022-03-21: qty 2

## 2022-03-21 MED ORDER — TIROFIBAN HCL IN NACL 5-0.9 MG/100ML-% IV SOLN
INTRAVENOUS | Status: AC | PRN
  Administered 2022-03-21: .15 ug/kg/min via INTRAVENOUS

## 2022-03-21 MED ORDER — SODIUM CHLORIDE 0.9% FLUSH: 3.0000 mL | INTRAVENOUS | Status: DC | PRN

## 2022-03-21 MED ORDER — MIDAZOLAM HCL 2 MG/2ML IJ SOLN
INTRAMUSCULAR | Status: AC
  Filled 2022-03-21: qty 2

## 2022-03-21 MED ORDER — TICAGRELOR 90 MG PO TABS
90.0000 mg | ORAL_TABLET | Freq: Two times a day (BID) | ORAL | Status: DC
  Administered 2022-03-21 – 2022-03-23 (×5): 90 mg via ORAL
  Filled 2022-03-21 (×5): qty 1

## 2022-03-21 MED ORDER — LIDOCAINE HCL (PF) 1 % IJ SOLN
INTRAMUSCULAR | Status: DC | PRN
  Administered 2022-03-21: 2 mL via INTRADERMAL

## 2022-03-21 MED ORDER — VERAPAMIL HCL 2.5 MG/ML IV SOLN
INTRA_ARTERIAL | Status: DC | PRN
  Administered 2022-03-21: 5 mL via INTRA_ARTERIAL

## 2022-03-21 MED ORDER — ASPIRIN 81 MG PO TBEC
81.0000 mg | DELAYED_RELEASE_TABLET | Freq: Every day | ORAL | Status: DC
  Administered 2022-03-21 – 2022-03-26 (×6): 81 mg via ORAL
  Filled 2022-03-21 (×6): qty 1

## 2022-03-21 MED ORDER — IOHEXOL 350 MG/ML SOLN
INTRAVENOUS | Status: DC | PRN
  Administered 2022-03-21: 130 mL

## 2022-03-21 MED ORDER — ORAL CARE MOUTH RINSE: 15.0000 mL | OROMUCOSAL | Status: DC | PRN

## 2022-03-21 MED ORDER — HEPARIN SODIUM (PORCINE) 5000 UNIT/ML IJ SOLN: 4000.0000 [IU] | Freq: Once | INTRAMUSCULAR | Status: AC

## 2022-03-21 MED ORDER — TICAGRELOR 90 MG PO TABS
ORAL_TABLET | ORAL | Status: AC
  Filled 2022-03-21: qty 2

## 2022-03-21 MED ORDER — LOSARTAN POTASSIUM 25 MG PO TABS
12.5000 mg | ORAL_TABLET | Freq: Every day | ORAL | Status: DC
  Administered 2022-03-21 – 2022-03-23 (×3): 12.5 mg via ORAL
  Filled 2022-03-21 (×3): qty 1

## 2022-03-21 MED ORDER — NOREPINEPHRINE 4 MG/250ML-% IV SOLN
0.0000 ug/min | INTRAVENOUS | Status: DC
  Filled 2022-03-21: qty 250

## 2022-03-21 MED ORDER — SODIUM CHLORIDE 0.9% FLUSH
3.0000 mL | Freq: Two times a day (BID) | INTRAVENOUS | Status: DC
  Administered 2022-03-21 – 2022-03-26 (×10): 3 mL via INTRAVENOUS

## 2022-03-21 MED ORDER — CHLORHEXIDINE GLUCONATE CLOTH 2 % EX PADS
6.0000 | MEDICATED_PAD | Freq: Every day | CUTANEOUS | Status: DC
  Administered 2022-03-21 – 2022-03-26 (×5): 6 via TOPICAL

## 2022-03-21 MED ORDER — HYDRALAZINE HCL 20 MG/ML IJ SOLN: 10.0000 mg | INTRAMUSCULAR | Status: AC | PRN

## 2022-03-21 MED ORDER — NITROGLYCERIN IN D5W 200-5 MCG/ML-% IV SOLN
0.0000 ug/min | INTRAVENOUS | Status: DC
  Administered 2022-03-21: 5 ug/min via INTRAVENOUS
  Filled 2022-03-21: qty 250

## 2022-03-21 MED ORDER — TIROFIBAN (AGGRASTAT) BOLUS VIA INFUSION
INTRAVENOUS | Status: DC | PRN
  Administered 2022-03-21: 2790 ug via INTRAVENOUS

## 2022-03-21 MED ORDER — FENTANYL CITRATE (PF) 100 MCG/2ML IJ SOLN
INTRAMUSCULAR | Status: DC | PRN
  Administered 2022-03-21 (×2): 25 ug via INTRAVENOUS

## 2022-03-21 MED ORDER — FENTANYL CITRATE (PF) 100 MCG/2ML IJ SOLN
INTRAMUSCULAR | Status: AC
  Filled 2022-03-21: qty 2

## 2022-03-21 MED ORDER — PERFLUTREN LIPID MICROSPHERE
1.0000 mL | INTRAVENOUS | Status: AC | PRN
  Administered 2022-03-21: 2 mL via INTRAVENOUS

## 2022-03-21 MED ORDER — MIDAZOLAM HCL 2 MG/2ML IJ SOLN
INTRAMUSCULAR | Status: DC | PRN
  Administered 2022-03-21: 1 mg via INTRAVENOUS

## 2022-03-21 MED ORDER — SODIUM CHLORIDE 0.9 % IV SOLN
INTRAVENOUS | Status: AC | PRN
  Administered 2022-03-21: 250 mL via INTRAVENOUS

## 2022-03-21 MED ORDER — FUROSEMIDE 10 MG/ML IJ SOLN
INTRAMUSCULAR | Status: DC | PRN
  Administered 2022-03-21: 20 mg via INTRAVENOUS

## 2022-03-21 MED ORDER — HEPARIN (PORCINE) IN NACL 1000-0.9 UT/500ML-% IV SOLN
INTRAVENOUS | Status: AC
  Filled 2022-03-21: qty 1000

## 2022-03-21 MED ORDER — FUROSEMIDE 10 MG/ML IJ SOLN
INTRAMUSCULAR | Status: AC
  Filled 2022-03-21: qty 4

## 2022-03-21 MED ORDER — SUMATRIPTAN SUCCINATE 50 MG PO TABS: 50.0000 mg | ORAL_TABLET | ORAL | Status: DC | PRN

## 2022-03-21 MED ORDER — SODIUM CHLORIDE 0.9 % IV SOLN: INTRAVENOUS | Status: AC

## 2022-03-21 MED ORDER — HEPARIN SODIUM (PORCINE) 5000 UNIT/ML IJ SOLN
INTRAMUSCULAR | Status: AC
  Administered 2022-03-21: 4000 [IU] via INTRAVENOUS
  Filled 2022-03-21: qty 1

## 2022-03-21 MED ORDER — ACETAMINOPHEN 325 MG PO TABS
650.0000 mg | ORAL_TABLET | ORAL | Status: DC | PRN
  Administered 2022-03-21 – 2022-03-26 (×2): 650 mg via ORAL
  Filled 2022-03-21 (×2): qty 2

## 2022-03-21 MED ORDER — NITROGLYCERIN 1 MG/10 ML FOR IR/CATH LAB
INTRA_ARTERIAL | Status: DC | PRN
  Administered 2022-03-21: 100 ug via INTRACORONARY
  Administered 2022-03-21: 200 ug via INTRACORONARY
  Administered 2022-03-21 (×4): 100 ug via INTRACORONARY

## 2022-03-21 MED ORDER — TICAGRELOR 90 MG PO TABS
ORAL_TABLET | ORAL | Status: DC | PRN
  Administered 2022-03-21: 180 mg via ORAL

## 2022-03-21 MED ORDER — POTASSIUM CHLORIDE CRYS ER 20 MEQ PO TBCR
40.0000 meq | EXTENDED_RELEASE_TABLET | Freq: Once | ORAL | Status: AC
  Administered 2022-03-21: 40 meq via ORAL
  Filled 2022-03-21: qty 2

## 2022-03-21 MED ORDER — TIROFIBAN HCL IN NACL 5-0.9 MG/100ML-% IV SOLN
INTRAVENOUS | Status: AC
  Filled 2022-03-21: qty 200

## 2022-03-21 MED ORDER — LABETALOL HCL 5 MG/ML IV SOLN: 10.0000 mg | INTRAVENOUS | Status: AC | PRN

## 2022-03-21 MED ORDER — SODIUM CHLORIDE 0.9 % IV SOLN
250.0000 mL | INTRAVENOUS | Status: DC | PRN
  Administered 2022-03-21: 250 mL via INTRAVENOUS

## 2022-03-21 MED ORDER — MORPHINE SULFATE (PF) 2 MG/ML IV SOLN
1.0000 mg | Freq: Three times a day (TID) | INTRAVENOUS | Status: DC | PRN
  Administered 2022-03-21 – 2022-03-24 (×2): 1 mg via INTRAVENOUS
  Filled 2022-03-21 (×2): qty 1

## 2022-03-21 MED ORDER — HEPARIN (PORCINE) IN NACL 1000-0.9 UT/500ML-% IV SOLN
INTRAVENOUS | Status: DC | PRN
  Administered 2022-03-21 (×2): 500 mL

## 2022-03-21 MED ORDER — SODIUM CHLORIDE 0.9 % IV SOLN: INTRAVENOUS | Status: DC

## 2022-03-21 MED ORDER — HEPARIN SODIUM (PORCINE) 1000 UNIT/ML IJ SOLN
INTRAMUSCULAR | Status: DC | PRN
  Administered 2022-03-21 (×2): 4000 [IU] via INTRAVENOUS
  Administered 2022-03-21: 2000 [IU] via INTRAVENOUS

## 2022-03-21 SURGICAL SUPPLY — 28 items
BALL SAPPHIRE NC24 4.0X15 (BALLOONS) ×1
BALLN SAPPHIRE 3.0X12 (BALLOONS) ×1
BALLN SAPPHIRE 3.5X20 (BALLOONS) ×1
BALLOON SAPPHIRE 3.0X12 (BALLOONS) IMPLANT
BALLOON SAPPHIRE 3.5X20 (BALLOONS) IMPLANT
BALLOON SAPPHIRE NC24 4.0X15 (BALLOONS) IMPLANT
CATH EXTRAC PRONTO 5.5F 138CM (CATHETERS) IMPLANT
CATH LAUNCHER 6FR EBU3.5 (CATHETERS) IMPLANT
CATH OPTITORQUE TIG 4.0 5F (CATHETERS) IMPLANT
ELECT DEFIB PAD ADLT CADENCE (PAD) IMPLANT
GLIDESHEATH SLEND A-KIT 6F 22G (SHEATH) IMPLANT
GUIDEWIRE INQWIRE 1.5J.035X260 (WIRE) IMPLANT
INQWIRE 1.5J .035X260CM (WIRE) ×1
KIT ENCORE 26 ADVANTAGE (KITS) IMPLANT
KIT HEART LEFT (KITS) ×1 IMPLANT
PACK CARDIAC CATHETERIZATION (CUSTOM PROCEDURE TRAY) ×1 IMPLANT
STENT SYNERGY XD 3.0X16 (Permanent Stent) IMPLANT
STENT SYNERGY XD 3.50X48 (Permanent Stent) IMPLANT
STENT SYNERGY XD 4.0X12 (Permanent Stent) IMPLANT
SYNERGY XD 3.0X16 (Permanent Stent) ×1 IMPLANT
SYNERGY XD 3.50X48 (Permanent Stent) ×1 IMPLANT
SYNERGY XD 4.0X12 (Permanent Stent) ×1 IMPLANT
TRANSDUCER W/STOPCOCK (MISCELLANEOUS) ×1 IMPLANT
TUBING CIL FLEX 10 FLL-RA (TUBING) ×1 IMPLANT
VALVE GUARDIAN II ~~LOC~~ HEMO (MISCELLANEOUS) IMPLANT
WIRE ASAHI PROWATER 180CM (WIRE) IMPLANT
WIRE EMERALD 3MM-J .035X150CM (WIRE) IMPLANT
WIRE HI TORQ BMW 190CM (WIRE) IMPLANT

## 2022-03-21 NOTE — Progress Notes (Signed)
   03/21/22 0046  Clinical Encounter Type  Visited With Patient;Family;Health care provider  Visit Type Code;Critical Care;Pre-op;Initial  Referral From Nurse Bryson Corona, RN)  Consult/Referral To Chaplain Melvenia Beam)  Recommendations CODE STEMI   This Chaplain paged to M.C.E.D. for CODE STEMI. Met Mr. Corderro Koloski. [place] AT Bedside upon arrival to E.D.  Met patient's work Librarian, academic in E.D. Tourist information centre manager and escorted him to the [facility] Family waiting area. At patient's request contacted his mother/ Mrs. Nobel Brar [phone] and met her at [address] first floor and accompanied her to 2-Heart/Cath Lab Waiting Area. Offered refreshment.  [address] [place], Ivin Poot., [phone]

## 2022-03-21 NOTE — H&P (Signed)
Michael Hart is an 36 y.o. male.   Chief Complaint: Chest pain HPI:   36 y.o. Caucasian male  with hypertension, hyperlipidemia, family h/o CAD with STEMI  Patient was working at [employer], started having chest discomfort at 5 PM when he got to work. He attributed that to indigestion initially. Around midnight, pain got worse, is when he called EMS> EKG showed anterolateral STEMI. Patient still in 10/10 pain after 6 morphine, diaphoretic  Past Medical History:  Diagnosis Date   Allergy    Asthma    Gout    Kidney stone    Panic attacks    nocturnal, controls with self directed biofeedback   Primary hypertension 03/03/2022    Past Surgical History:  Procedure Laterality Date   NO PAST SURGERIES       Family History  Problem Relation Age of Onset   Breast cancer Mother    Lung cancer Father    Heart disease Father        CABG   COPD Father    Hypertension Other    Stroke Other    Migraines Neg Hx    Headache Neg Hx    Sleep apnea Neg Hx     Social History:  reports that he has quit smoking. He quit smokeless tobacco use about 10 years ago. He reports current alcohol use of about 2.0 standard drinks of alcohol per week. He reports that he does not use drugs.  Allergies:  Allergies  Allergen Reactions   Penicillins    Sulfa Antibiotics     Review of Systems  Cardiovascular:  Positive for chest pain. Negative for dyspnea on exertion, leg swelling, palpitations and syncope.     Physical Exam Vitals and nursing note reviewed.  Constitutional:      General: He is in acute distress.     Appearance: He is diaphoretic.  Neck:     Vascular: No JVD.  Cardiovascular:     Rate and Rhythm: Normal rate and regular rhythm.     Heart sounds: Normal heart sounds. No murmur heard. Pulmonary:     Effort: Pulmonary effort is normal.     Breath sounds: Normal breath sounds. No wheezing or rales.  Musculoskeletal:     Right lower leg: No edema.     Left lower leg: No edema.       (Not in a hospital admission)    No current facility-administered medications for this encounter.  Current Outpatient Medications:    allopurinol (ZYLOPRIM) 100 MG tablet, Take 1 tablet (100 mg total) by mouth daily., Disp: 30 tablet, Rfl: 5   amLODipine (NORVASC) 5 MG tablet, Take 1 tablet by mouth daily., Disp: 30 tablet, Rfl: 2   cetirizine (ZYRTEC) 10 MG tablet, Take 10 mg by mouth as needed., Disp: , Rfl:    rizatriptan (MAXALT-MLT) 10 MG disintegrating tablet, Dissolve 1 tablet (10 mg total) by mouth as needed for migraine. May repeat in 2 hours if needed, Disp: 9 tablet, Rfl: 11   rosuvastatin (CRESTOR) 5 MG tablet, Take 1 tablet (5 mg total) by mouth daily., Disp: 90 tablet, Rfl: 0  BP 98/64 mmHg HR 83 bpm There were no vitals filed for this visit. There is no height or weight on file to calculate BMI.    Lab Results: Reviewed and interpreted: Pending    Tests ordered: Lab Orders  No laboratory test(s) ordered today      Cardiac Studies:  EKG 03/21/2022: Sinus rhythm Anterolateral STEMI  Echocardiogram: Ordered  Imaging/tests reviewed and independently interpreted: CXR  Assessment & Recommendations:  36 y.o. Caucasian male  with hypertension, hyperlipidemia, family h/o CAD with STEMI  STEMI: Suspect LAD occlusion Potential need for hemodynamic support if he becomes further hypotensive Emergent cath. Risks, benefuts explained to the patient.     Nigel Mormon, MD Pager: [phone] Office: [phone]

## 2022-03-21 NOTE — ED Triage Notes (Signed)
Pt was at work when he began having chest tightness and light headed. Pt has recent htn dx but no cardiac history. Pt received 2 nitros 324 asa 6mg  morphine.

## 2022-03-21 NOTE — ED Notes (Signed)
Taken to cath lab by RN, NT, and cardiologist.

## 2022-03-21 NOTE — ED Provider Notes (Signed)
[facility] EMERGENCY DEPARTMENT Provider Note   CSN: [id number] Arrival date & time: 03/21/22  0058     History  Chief Complaint  Patient presents with   Code STEMI    Michael Hart is a 36 y.o. male.  The history is provided by the patient and the EMS personnel.  Michael Hart is a 36 y.o. male who presents to the Emergency Department complaining of chest pain.  He presents to the emergency department for evaluation of chest pain as a STEMI alert.  He states that he started having a chest pressure and indigestion around 5 PM when he got to work.  He went on his break and became diaphoretic and weak with worsening chest pain and EMS was called.  Prehospital EKG with ST elevation and a STEMI alert was activated prehospital.  At time of ED presentation patient's chest pain is persistent but improved after nitroglycerin, aspirin and morphine by EMS.  He has a history of hyperlipidemia.  He has a family history of coronary artery disease.      Home Medications Prior to Admission medications   Medication Sig Start Date End Date Taking? Authorizing Provider  allopurinol (ZYLOPRIM) 100 MG tablet Take 1 tablet (100 mg total) by mouth daily. 03/03/22   Henson, Vickie L, NP-C  amLODipine (NORVASC) 5 MG tablet Take 1 tablet by mouth daily. 03/03/22   Henson, Vickie L, NP-C  cetirizine (ZYRTEC) 10 MG tablet Take 10 mg by mouth as needed.    [provider]  rizatriptan (MAXALT-MLT) 10 MG disintegrating tablet Dissolve 1 tablet (10 mg total) by mouth as needed for migraine. May repeat in 2 hours if needed 11/24/21   Anson Fret, MD  rosuvastatin (CRESTOR) 5 MG tablet Take 1 tablet (5 mg total) by mouth daily. 03/03/22   Henson, Vickie L, NP-C      Allergies    Penicillins and Sulfa antibiotics    Review of Systems   Review of Systems  All other systems reviewed and are negative.   Physical Exam Updated Vital Signs Ht 5\' 10"  (1.778 m)   Wt 111.6 kg    BMI 35.30 kg/m  Physical Exam Vitals and nursing note reviewed.  Constitutional:      General: He is in acute distress.     Appearance: He is well-developed. He is ill-appearing and diaphoretic.  HENT:     Head: Normocephalic and atraumatic.  Cardiovascular:     Rate and Rhythm: Normal rate and regular rhythm.     Heart sounds: No murmur heard. Pulmonary:     Effort: Pulmonary effort is normal. No respiratory distress.     Breath sounds: Normal breath sounds.  Abdominal:     Palpations: Abdomen is soft.     Tenderness: There is no abdominal tenderness. There is no guarding or rebound.  Musculoskeletal:        General: No swelling or tenderness.  Skin:    General: Skin is warm.     Coloration: Skin is pale.  Neurological:     Mental Status: He is alert and oriented to person, place, and time.  Psychiatric:        Behavior: Behavior normal.     ED Results / Procedures / Treatments   Labs (all labs ordered are listed, but only abnormal results are displayed) Labs Reviewed  RESP PANEL BY RT-PCR (FLU A&B, COVID) ARPGX2  HEMOGLOBIN A1C  CBC WITH DIFFERENTIAL/PLATELET  PROTIME-INR  APTT  COMPREHENSIVE METABOLIC PANEL  LIPID PANEL  TROPONIN I (HIGH SENSITIVITY)    EKG None  Radiology No results found.  Procedures Procedures    Medications Ordered in ED Medications  0.9 %  sodium chloride infusion (has no administration in time range)  heparin injection 4,000 Units (has no administration in time range)  heparin 5000 UNIT/ML injection (has no administration in time range)  norepinephrine (LEVOPHED) 4mg  in 279mL (0.016 mg/mL) premix infusion (has no administration in time range)   CRITICAL CARE Performed by: Quintella Reichert   Total critical care time: 20 minutes  Critical care time was exclusive of separately billable procedures and treating other patients.  Critical care was necessary to treat or prevent imminent or life-threatening deterioration.  Critical  care was time spent personally by me on the following activities: development of treatment plan with patient and/or surrogate as well as nursing, discussions with consultants, evaluation of patient's response to treatment, examination of patient, obtaining history from patient or surrogate, ordering and performing treatments and interventions, ordering and review of laboratory studies, ordering and review of radiographic studies, pulse oximetry and re-evaluation of patient's condition.  ED Course/ Medical Decision Making/ A&P                           Medical Decision Making Amount and/or Complexity of Data Reviewed Labs: ordered. Radiology: ordered.  Risk Prescription drug management. Decision regarding hospitalization.   Patient here as a STEMI alert for chest pain, EKG with acute changes.  Patient ill-appearing, diaphoretic on ED presentation with soft blood pressures.  He was evaluated by cardiology at time of ED presentation.  He was treated with heparin bolus.  Plan to take emergently to the Cath Lab for intervention.       Final Clinical Impression(s) / ED Diagnoses Final diagnoses:  ST elevation myocardial infarction (STEMI), unspecified artery [facility])    Rx / DC Orders ED Discharge Orders     None         Quintella Reichert, MD 03/21/22 [phone]

## 2022-03-21 NOTE — Progress Notes (Signed)
CARDIAC REHAB PHASE I     Spoke with Katie RN and Carson Myrtle RN regarding pt status. Pt is having continued cp has just received pain medication. Pt is drowsy,  unable to provide education at this time.  Will continue to follow pt. Will provide ambulation stent teaching when appropriate.  [id number]  Vanessa Barbara, RN BSN 03/21/2022 11:41 AM

## 2022-03-21 NOTE — CV Procedure (Addendum)
LM: Normal LAD: Prox 100% occlusion          Mid 80-90% stenoses          Diffuse diag disease Ramus: Large vessel. Prox 75% stenosis Lcx: Mid 95%, OM1 95% bifurcation stenosis RCA: Mid 80%, followed by 75% stenoses          Distal 80% stenosis  Aspiration thrombectomy, PTCA and overlapping stents placement Prox-mid LAD Synergy 4.0X8, 3.5X48, 3.0X16 mm This was necessitated by flow limiting lesions beyond the culprit lesion  Distal thromboembolization. Recommend 18 hr Aggrastat  Will discuss non-culprit artery revascularization   Nigel Mormon, MD Pager: [phone] Office: [phone]

## 2022-03-22 ENCOUNTER — Inpatient Hospital Stay (HOSPITAL_COMMUNITY): Payer: 59

## 2022-03-22 DIAGNOSIS — I2510 Atherosclerotic heart disease of native coronary artery without angina pectoris: Secondary | ICD-10-CM

## 2022-03-22 DIAGNOSIS — I2101 ST elevation (STEMI) myocardial infarction involving left main coronary artery: Secondary | ICD-10-CM

## 2022-03-22 LAB — BRAIN NATRIURETIC PEPTIDE: B Natriuretic Peptide: 226.2 pg/mL — ABNORMAL HIGH (ref 0.0–100.0)

## 2022-03-22 LAB — BASIC METABOLIC PANEL
Anion gap: 7 (ref 5–15)
BUN: 10 mg/dL (ref 6–20)
CO2: 27 mmol/L (ref 22–32)
Calcium: 8.7 mg/dL — ABNORMAL LOW (ref 8.9–10.3)
Chloride: 101 mmol/L (ref 98–111)
Creatinine, Ser: 1.07 mg/dL (ref 0.61–1.24)
GFR, Estimated: >60 mL/min (ref >60)
Glucose, Bld: 95 mg/dL (ref 70–99)
Potassium: 3.8 mmol/L (ref 3.5–5.1)
Sodium: 135 mmol/L (ref 135–145)

## 2022-03-22 LAB — CBC
HCT: 43.1 % (ref 39.0–52.0)
Hemoglobin: 15.6 g/dL (ref 13.0–17.0)
MCH: 31.3 pg (ref 26.0–34.0)
MCHC: 36.2 g/dL — ABNORMAL HIGH (ref 30.0–36.0)
MCV: 86.5 fL (ref 80.0–100.0)
Platelets: 289 10*3/uL (ref 150–400)
RBC: 4.98 MIL/uL (ref 4.22–5.81)
RDW: 11.9 % (ref 11.5–15.5)
WBC: 13.7 10*3/uL — ABNORMAL HIGH (ref 4.0–10.5)
nRBC: 0 % (ref 0.0–0.2)

## 2022-03-22 LAB — LIPOPROTEIN A (LPA): Lipoprotein (a): 63.4 nmol/L — ABNORMAL HIGH (ref <75.0)

## 2022-03-22 MED ORDER — EMPAGLIFLOZIN 10 MG PO TABS
10.0000 mg | ORAL_TABLET | Freq: Every day | ORAL | Status: DC
  Administered 2022-03-22 – 2022-03-26 (×5): 10 mg via ORAL
  Filled 2022-03-22 (×5): qty 1

## 2022-03-22 MED ORDER — METOPROLOL SUCCINATE ER 25 MG PO TB24
12.5000 mg | ORAL_TABLET | Freq: Every day | ORAL | Status: DC
  Administered 2022-03-22 – 2022-03-23 (×2): 12.5 mg via ORAL
  Filled 2022-03-22 (×2): qty 1

## 2022-03-22 MED ORDER — DIPHENHYDRAMINE HCL 25 MG PO CAPS
25.0000 mg | ORAL_CAPSULE | Freq: Every evening | ORAL | Status: AC | PRN
  Administered 2022-03-22: 25 mg via ORAL
  Filled 2022-03-22: qty 1

## 2022-03-22 NOTE — H&P (View-Only) (Signed)
Reason for Consult: CAD s/p STEMI Referring Physician: Dr. Azucena Cecil is an 36 y.o. male.  HPI: Michael Hart presented with a cc/o CP  Michael Hart is a 36 yo man with a history of hypertension, asthma, gout, nephrolithiasis, panic attacks, and a family history of CAD.  Smoked briefly but quit 9 years ago.   He had onset of CP around 5 PM yesterday evening. Waxed and waned initially and he thought is was indigestion. Around midnight pain worsened to 10/10, he laid down and became diaphoretic.  Coworkers called EMS. Brought to ED as a code STEMI.  Underwent emergent cath which showed occluded LAD and severe 3 vessel CAD. Dr. Virgina Jock was able to reopen LAD with aspiration thrombectomy, PTCA and stent placement.  Has had residual 1/10 CP unchanged with activity since procedure.  In retrospect he has had similar sensations, much less severe over the past few months.  Past Medical History:  Diagnosis Date   Allergy    Asthma    Gout    Kidney stone    Panic attacks    nocturnal, controls with self directed biofeedback   Primary hypertension 03/03/2022    Past Surgical History:  Procedure Laterality Date   NO PAST SURGERIES      Family History  Problem Relation Age of Onset   Breast cancer Mother    Lung cancer Father    Heart disease Father        CABG   COPD Father    Hypertension Other    Stroke Other    Migraines Neg Hx    Headache Neg Hx    Sleep apnea Neg Hx     Social History:  reports that he has quit smoking. He quit smokeless tobacco use about 10 years ago. He reports current alcohol use of about 2.0 standard drinks of alcohol per week. He reports that he does not use drugs.  Allergies:  Allergies  Allergen Reactions   Penicillins Shortness Of Breath, Swelling and Other (See Comments)    Childhood reaction   Sulfa Antibiotics Shortness Of Breath, Swelling and Other (See Comments)    Childhood reaction    Medications: Scheduled:   aspirin EC  81 mg Oral Daily   Chlorhexidine Gluconate Cloth  6 each Topical Daily   empagliflozin  10 mg Oral Daily   losartan  12.5 mg Oral Daily   metoprolol succinate  12.5 mg Oral Daily   rosuvastatin  20 mg Oral Daily   sodium chloride flush  3 mL Intravenous Q12H   ticagrelor  90 mg Oral BID    Results for orders placed or performed during the hospital encounter of 03/21/22 (from the past 48 hour(s))  Resp Panel by RT-PCR (Flu A&B, Covid) Anterior Nasal Swab     Status: None   Collection Time: 03/21/22  1:04 AM   Specimen: Anterior Nasal Swab  Result Value Ref Range   SARS Coronavirus 2 by RT PCR NEGATIVE NEGATIVE    Comment: (NOTE) SARS-CoV-2 target nucleic acids are NOT DETECTED.  The SARS-CoV-2 RNA is generally detectable in upper respiratory specimens during the acute phase of infection. The lowest concentration of SARS-CoV-2 viral copies this assay can detect is 138 copies/mL. A negative result does not preclude SARS-Cov-2 infection and should not be used as the sole basis for treatment or other patient management decisions. A negative result may occur with  improper specimen collection/handling, submission of specimen other than nasopharyngeal swab, presence of viral  mutation(s) within the areas targeted by this assay, and inadequate number of viral copies(<138 copies/mL). A negative result must be combined with clinical observations, patient history, and epidemiological information. The expected result is Negative.  Fact Sheet for Patients:  EntrepreneurPulse.com.au  Fact Sheet for Healthcare Providers:  IncredibleEmployment.be  This test is no t yet approved or cleared by the [place] FDA and  has been authorized for detection and/or diagnosis of SARS-CoV-2 by FDA under an Emergency Use Authorization (EUA). This EUA will remain  in effect (meaning this test can be used) for the duration of the COVID-19 declaration under  Section 564(b)(1) of the Act, 21 U.S.C.section 360bbb-3(b)(1), unless the authorization is terminated  or revoked sooner.       Influenza A by PCR NEGATIVE NEGATIVE   Influenza B by PCR NEGATIVE NEGATIVE    Comment: (NOTE) The Xpert Xpress SARS-CoV-2/FLU/RSV plus assay is intended as an aid in the diagnosis of influenza from Nasopharyngeal swab specimens and should not be used as a sole basis for treatment. Nasal washings and aspirates are unacceptable for Xpert Xpress SARS-CoV-2/FLU/RSV testing.  Fact Sheet for Patients: EntrepreneurPulse.com.au  Fact Sheet for Healthcare Providers: IncredibleEmployment.be  This test is not yet approved or cleared by the [place] FDA and has been authorized for detection and/or diagnosis of SARS-CoV-2 by FDA under an Emergency Use Authorization (EUA). This EUA will remain in effect (meaning this test can be used) for the duration of the COVID-19 declaration under Section 564(b)(1) of the Act, 21 U.S.C. section 360bbb-3(b)(1), unless the authorization is terminated or revoked.  Performed at [facility], [place] [address], [place], [place] [address]   Hemoglobin A1c     Status: None   Collection Time: 03/21/22  1:05 AM  Result Value Ref Range   Hgb A1c MFr Bld 5.2 4.8 - 5.6 %    Comment: (NOTE) Pre diabetes:          5.7%-6.4%  Diabetes:              >6.4%  Glycemic control for   <7.0% adults with diabetes    Mean Plasma Glucose 102.54 mg/dL    Comment: Performed at [facility] [address] [place], [place] [address]  CBC with Differential/Platelet     Status: Abnormal   Collection Time: 03/21/22  1:05 AM  Result Value Ref Range   WBC 10.2 4.0 - 10.5 K/uL   RBC 4.79 4.22 - 5.81 MIL/uL   Hemoglobin 14.8 13.0 - 17.0 g/dL   HCT 41.6 39.0 - 52.0 %   MCV 86.8 80.0 - 100.0 fL   MCH 30.9 26.0 - 34.0 pg   MCHC 35.6 30.0 - 36.0 g/dL   RDW 11.8 11.5 - 15.5 %   Platelets 336 150 - 400  K/uL   nRBC 0.0 0.0 - 0.2 %   Neutrophils Relative % 43 %   Neutro Abs 4.4 1.7 - 7.7 K/uL   Lymphocytes Relative 43 %   Lymphs Abs 4.3 (H) 0.7 - 4.0 K/uL   Monocytes Relative 10 %   Monocytes Absolute 1.1 (H) 0.1 - 1.0 K/uL   Eosinophils Relative 2 %   Eosinophils Absolute 0.2 0.0 - 0.5 K/uL   Basophils Relative 1 %   Basophils Absolute 0.1 0.0 - 0.1 K/uL   Immature Granulocytes 1 %   Abs Immature Granulocytes 0.05 0.00 - 0.07 K/uL    Comment: Performed at [facility] [address], [place], [place]  [place]     Status: None   Collection Time: 03/21/22  1:05 AM  Result Value Ref Range   Prothrombin Time 13.4 11.4 - 15.2 seconds   INR 1.0 0.8 - 1.2    Comment: (NOTE) INR goal varies based on device and disease states. Performed at [facility], [place] [address], [place], [place] [address]   APTT     Status: None   Collection Time: 03/21/22  1:05 AM  Result Value Ref Range   aPTT 24 24 - 36 seconds    Comment: Performed at [facility] [address], [place], [place] [address]  Comprehensive metabolic panel     Status: Abnormal   Collection Time: 03/21/22  1:05 AM  Result Value Ref Range   Sodium 139 135 - 145 mmol/L   Potassium 3.5 3.5 - 5.1 mmol/L   Chloride 104 98 - 111 mmol/L   CO2 21 (L) 22 - 32 mmol/L   Glucose, Bld 137 (H) 70 - 99 mg/dL    Comment: Glucose reference range applies only to samples taken after fasting for at least 8 hours.   BUN 17 6 - 20 mg/dL   Creatinine, Ser 1.16 0.61 - 1.24 mg/dL   Calcium 9.5 8.9 - 10.3 mg/dL   Total Protein 6.7 6.5 - 8.1 g/dL   Albumin 3.9 3.5 - 5.0 g/dL   AST 39 15 - 41 U/L   ALT 44 0 - 44 U/L   Alkaline Phosphatase 37 (L) 38 - 126 U/L   Total Bilirubin 0.6 0.3 - 1.2 mg/dL   GFR, Estimated >60 >60 mL/min    Comment: (NOTE) Calculated using the CKD-EPI Creatinine Equation (2021)    Anion gap 14 5 - 15    Comment: Performed at [facility] [address], [place], [place] [address]   Troponin I (High Sensitivity)     Status: Abnormal   Collection Time: 03/21/22  1:05 AM  Result Value Ref Range   Troponin I (High Sensitivity) 129 (HH) <18 ng/L    Comment: CRITICAL RESULT CALLED TO, READ BACK BY AND VERIFIED WITH P CAMERON RN 03/21/2022 0204 B NUNNERY (NOTE) Elevated high sensitivity troponin I (hsTnI) values and significant  changes across serial measurements may suggest ACS but many other  chronic and acute conditions are known to elevate hsTnI results.  Refer to the "Links" section for chest pain algorithms and additional  guidance. Performed at [facility], [place] [address] [place], [place] [address]   Lipid panel     Status: Abnormal   Collection Time: 03/21/22  1:05 AM  Result Value Ref Range   Cholesterol 181 0 - 200 mg/dL   Triglycerides 432 (H) <150 mg/dL   HDL 29 (L) >40 mg/dL   Total CHOL/HDL Ratio 6.2 RATIO   VLDL UNABLE TO CALCULATE IF TRIGLYCERIDE OVER 400 mg/dL 0 - 40 mg/dL   LDL Cholesterol UNABLE TO CALCULATE IF TRIGLYCERIDE OVER 400 mg/dL 0 - 99 mg/dL    Comment:        Total Cholesterol/HDL:CHD Risk Coronary Heart Disease Risk Table                     Men   Women  1/2 Average Risk   3.4   3.3  Average Risk       5.0   4.4  2 X Average Risk   9.6   7.1  3 X Average Risk  23.4   11.0        Use the calculated Patient Ratio above and the CHD Risk Table to determine the patient's CHD Risk.        ATP III CLASSIFICATION (LDL):  <100     mg/dL   Optimal  100-129  mg/dL   Near or Above                    Optimal  130-159  mg/dL   Borderline  160-189  mg/dL   High  >190     mg/dL   Very High Performed at [facility] [address] [place], [place] [address]   LDL cholesterol, direct     Status: Abnormal   Collection Time: 03/21/22  1:05 AM  Result Value Ref Range   Direct LDL 109 (H) 0 - 99 mg/dL    Comment: Performed at [facility] [address], [place], [place] [address]  I-STAT, [place] [address]     Status: Abnormal    Collection Time: 03/21/22  1:28 AM  Result Value Ref Range   Sodium 139 135 - 145 mmol/L   Potassium 3.4 (L) 3.5 - 5.1 mmol/L   Chloride 104 98 - 111 mmol/L   BUN 15 6 - 20 mg/dL   Creatinine, Ser 0.80 0.61 - 1.24 mg/dL   Glucose, Bld 155 (H) 70 - 99 mg/dL    Comment: Glucose reference range applies only to samples taken after fasting for at least 8 hours.   Calcium, Ion 1.16 1.15 - 1.40 mmol/L   TCO2 21 (L) 22 - 32 mmol/L   Hemoglobin 13.3 13.0 - 17.0 g/dL   HCT 39.0 39.0 - 52.0 %  POCT Activated clotting time     Status: None   Collection Time: 03/21/22  1:38 AM  Result Value Ref Range   Activated Clotting Time 588 seconds    Comment: Reference range 74-137 seconds for patients not on anticoagulant therapy.  POCT Activated clotting time     Status: None   Collection Time: 03/21/22  1:51 AM  Result Value Ref Range   Activated Clotting Time 600 seconds    Comment: Reference range 74-137 seconds for patients not on anticoagulant therapy.  POCT Activated clotting time     Status: None   Collection Time: 03/21/22  2:32 AM  Result Value Ref Range   Activated Clotting Time 251 seconds    Comment: Reference range 74-137 seconds for patients not on anticoagulant therapy.  MRSA Next Gen by PCR, Nasal     Status: None   Collection Time: 03/21/22  3:52 AM   Specimen: Nasal Mucosa; Nasal Swab  Result Value Ref Range   MRSA by PCR Next Gen NOT DETECTED NOT DETECTED    Comment: (NOTE) The GeneXpert MRSA Assay (FDA approved for NASAL specimens only), is one component of a comprehensive MRSA colonization surveillance program. It is not intended to diagnose MRSA infection nor to guide or monitor treatment for MRSA infections. Test performance is not FDA approved in patients less than 58 years old. Performed at [facility], [place] [address], [place], [place] [address]   Basic metabolic panel     Status: Abnormal   Collection Time: 03/21/22  4:08 AM  Result Value Ref Range   Sodium  135 135 - 145 mmol/L   Potassium 4.0 3.5 - 5.1 mmol/L   Chloride 101 98 - 111 mmol/L   CO2 22 22 - 32 mmol/L  Glucose, Bld 136 (H) 70 - 99 mg/dL    Comment: Glucose reference range applies only to samples taken after fasting for at least 8 hours.   BUN 13 6 - 20 mg/dL   Creatinine, Ser 1.05 0.61 - 1.24 mg/dL   Calcium 9.5 8.9 - 10.3 mg/dL   GFR, Estimated >60 >60 mL/min    Comment: (NOTE) Calculated using the CKD-EPI Creatinine Equation (2021)    Anion gap 12 5 - 15    Comment: Performed at [facility] [address] [place], [place] [address]  CBC     Status: Abnormal   Collection Time: 03/21/22  4:08 AM  Result Value Ref Range   WBC 13.2 (H) 4.0 - 10.5 K/uL   RBC 4.95 4.22 - 5.81 MIL/uL   Hemoglobin 15.3 13.0 - 17.0 g/dL   HCT 42.9 39.0 - 52.0 %   MCV 86.7 80.0 - 100.0 fL   MCH 30.9 26.0 - 34.0 pg   MCHC 35.7 30.0 - 36.0 g/dL   RDW 11.8 11.5 - 15.5 %   Platelets 298 150 - 400 K/uL   nRBC 0.0 0.0 - 0.2 %    Comment: Performed at [facility], [place] [address], [place], [place] [address]  Troponin I (High Sensitivity)     Status: Abnormal   Collection Time: 03/21/22 10:07 AM  Result Value Ref Range   Troponin I (High Sensitivity) >24,000 (HH) <18 ng/L    Comment: CRITICAL VALUE NOTED.  VALUE IS CONSISTENT WITH PREVIOUSLY REPORTED AND CALLED VALUE. (NOTE) Elevated high sensitivity troponin I (hsTnI) values and significant  changes across serial measurements may suggest ACS but many other  chronic and acute conditions are known to elevate hsTnI results.  Refer to the "Links" section for chest pain algorithms and additional  guidance.  Due to the decreased utility in serial monitoring of hsTroponin once the value exceeds 24,000 ng/L, orders for the next 24 hours will be discontinued. Confirmation testing may be ordered. HS Troponin lab(s) may be reordered after 24 hours. Performed at [facility], [place] [address] [place], [place] [address]   Brain  natriuretic peptide     Status: None   Collection Time: 03/21/22 11:00 AM  Result Value Ref Range   B Natriuretic Peptide 10.5 0.0 - 100.0 pg/mL    Comment: Performed at [facility] [address], [place], [place] [address]  CBC     Status: Abnormal   Collection Time: 03/21/22  1:09 PM  Result Value Ref Range   WBC 13.7 (H) 4.0 - 10.5 K/uL   RBC 4.77 4.22 - 5.81 MIL/uL   Hemoglobin 15.1 13.0 - 17.0 g/dL   HCT 40.6 39.0 - 52.0 %   MCV 85.1 80.0 - 100.0 fL   MCH 31.7 26.0 - 34.0 pg   MCHC 37.2 (H) 30.0 - 36.0 g/dL   RDW 11.7 11.5 - 15.5 %   Platelets 286 150 - 400 K/uL   nRBC 0.0 0.0 - 0.2 %    Comment: Performed at [facility], [place] [address], [place], [place] [address]  Glucose, capillary     Status: Abnormal   Collection Time: 03/21/22  3:55 PM  Result Value Ref Range   Glucose-Capillary 127 (H) 70 - 99 mg/dL    Comment: Glucose reference range applies only to samples taken after fasting for at least 8 hours.  CBC     Status: Abnormal   Collection Time: 03/22/22 11:24  AM  Result Value Ref Range   WBC 13.7 (H) 4.0 - 10.5 K/uL   RBC 4.98 4.22 - 5.81 MIL/uL   Hemoglobin 15.6 13.0 - 17.0 g/dL   HCT 43.1 39.0 - 52.0 %   MCV 86.5 80.0 - 100.0 fL   MCH 31.3 26.0 - 34.0 pg   MCHC 36.2 (H) 30.0 - 36.0 g/dL   RDW 11.9 11.5 - 15.5 %   Platelets 289 150 - 400 K/uL   nRBC 0.0 0.0 - 0.2 %    Comment: Performed at [facility] [address] [place], [place] [address]  Basic metabolic panel     Status: Abnormal   Collection Time: 03/22/22 11:24 AM  Result Value Ref Range   Sodium 135 135 - 145 mmol/L   Potassium 3.8 3.5 - 5.1 mmol/L   Chloride 101 98 - 111 mmol/L   CO2 27 22 - 32 mmol/L   Glucose, Bld 95 70 - 99 mg/dL    Comment: Glucose reference range applies only to samples taken after fasting for at least 8 hours.   BUN 10 6 - 20 mg/dL   Creatinine, Ser 1.07 0.61 - 1.24 mg/dL   Calcium 8.7 (L) 8.9 - 10.3 mg/dL   GFR, Estimated >60 >60 mL/min     Comment: (NOTE) Calculated using the CKD-EPI Creatinine Equation (2021)    Anion gap 7 5 - 15    Comment: Performed at [facility] [address], [place], [place] [address]  Brain natriuretic peptide     Status: Abnormal   Collection Time: 03/22/22 11:24 AM  Result Value Ref Range   B Natriuretic Peptide 226.2 (H) 0.0 - 100.0 pg/mL    Comment: Performed at [facility] [address], [place], [place] [address]    DG CHEST PORT 1 VIEW  Result Date: 03/22/2022 CLINICAL DATA:  Mild cardial infarction. EXAM: PORTABLE CHEST 1 VIEW COMPARISON:  02/14/2021. FINDINGS: Cardiac silhouette is normal in size and configuration. Normal mediastinal and hilar contours. Clear lungs.  No convincing pleural effusion.  No pneumothorax. Skeletal structures are grossly intact. IMPRESSION: No active disease. Electronically Signed   By: Lajean Manes M.D.   On: 03/22/2022 13:30   CARDIAC CATHETERIZATION  Result Date: 03/21/2022 Images from the original result were not included. LM: Normal LAD: Prox 100% occlusion          Mid 80-90% stenoses          Diffuse 80% diag disease Ramus: Large vessel. Prox 75% stenosis Lcx: Mid 95%, OM1 95% bifurcation stenosis RCA: Mid 80%, followed by 75% stenoses          Distal 80% stenosis  Successful percutaneous coronary intervention prox-mid LAD     Aspiration thrombectomy, PTCA, and overlapping stents placement     Synergy DES 4.0X12 mm, 3.5X48 mm, 3.0X16 mm     This was necessitated by flow limiting lesions beyond the culprit lesion     Post dilataation with 4.0X15 mm [place] balloon upto 18 atm     We will consider nonculprit PCI to remaining vessels during index hospitalization. Nigel Mormon, MD Pager: [phone] Office: [phone]  ECHOCARDIOGRAM COMPLETE  Result Date: 03/21/2022    ECHOCARDIOGRAM REPORT   Patient Name:   Michael Hart Date of Exam: 03/21/2022 Medical Rec #:  [id number]        Height:       70.0 in Accession #:    [id number]  Weight:       249.8 lb Date of Birth:  [birth year]        BSA:          2.294 m Patient Age:    84 years         BP:           116/85 mmHg Patient Gender: M                HR:           80 bpm. Exam Location:  Inpatient Procedure: 2D Echo, Cardiac Doppler, Color Doppler and Intracardiac            Opacification Agent Indications:     Acute MI  History:         Patient has no prior history of Echocardiogram examinations.                  Risk Factors:Dyslipidemia and Former Smoker.  Sonographer:     Clayton Lefort RDCS (AE) Referring Phys:  Reynold Bowen [facility] Diagnosing Phys: Vernell Leep MD IMPRESSIONS  1. Left ventricular ejection fraction, by estimation, is 40 to 45%. The left ventricle has mildly decreased function. The left ventricle demonstrates regional wall motion abnormalities (see scoring diagram/findings for description). There is mild left ventricular hypertrophy. Left ventricular diastolic parameters are indeterminate. There is severe hypokinesis of the left ventricular, mid-apical anteroseptal wall and anterior wall. There is akinesis of the left ventricular, apical segment.  2. Right ventricular systolic function is normal. The right ventricular size is normal.  3. The mitral valve is normal in structure. No evidence of mitral valve regurgitation. No evidence of mitral stenosis.  4. The aortic valve is normal in structure. Aortic valve regurgitation is not visualized. No aortic stenosis is present. FINDINGS  Left Ventricle: Left ventricular ejection fraction, by estimation, is 40 to 45%. The left ventricle has mildly decreased function. The left ventricle demonstrates regional wall motion abnormalities. Severe hypokinesis of the left ventricular, mid-apical  anteroseptal wall and anterior wall. Definity contrast agent was given IV to delineate the left ventricular endocardial borders. The left ventricular internal cavity size was normal in size. There is mild left ventricular hypertrophy. Left  ventricular diastolic parameters are indeterminate. Right Ventricle: The right ventricular size is normal. No increase in right ventricular wall thickness. Right ventricular systolic function is normal. Left Atrium: Left atrial size was normal in size. Right Atrium: Right atrial size was normal in size. Pericardium: There is no evidence of pericardial effusion. Mitral Valve: The mitral valve is normal in structure. No evidence of mitral valve regurgitation. No evidence of mitral valve stenosis. Tricuspid Valve: The tricuspid valve is normal in structure. Tricuspid valve regurgitation is not demonstrated. No evidence of tricuspid stenosis. Aortic Valve: The aortic valve is normal in structure. Aortic valve regurgitation is not visualized. No aortic stenosis is present. Aortic valve mean gradient measures 3.0 mmHg. Aortic valve peak gradient measures 5.2 mmHg. Aortic valve area, by VTI measures 2.70 cm. Pulmonic Valve: The pulmonic valve was normal in structure. Pulmonic valve regurgitation is not visualized. No evidence of pulmonic stenosis. Aorta: The aortic root is normal in size and structure. IAS/Shunts: The interatrial septum was not assessed.  LEFT VENTRICLE PLAX 2D LVIDd:         4.90 cm      Diastology LVIDs:         3.20 cm      LV e' medial:    5.98 cm/s LV  PW:         1.10 cm      LV E/e' medial:  12.7 LV IVS:        1.20 cm      LV e' lateral:   9.79 cm/s LVOT diam:     2.00 cm      LV E/e' lateral: 7.8 LV SV:         56 LV SV Index:   24 LVOT Area:     3.14 cm  LV Volumes (MOD) LV vol d, MOD A2C: 83.8 ml LV vol d, MOD A4C: 119.0 ml LV vol s, MOD A2C: 46.1 ml LV vol s, MOD A4C: 68.4 ml LV SV MOD A2C:     37.7 ml LV SV MOD A4C:     119.0 ml LV SV MOD BP:      48.3 ml RIGHT VENTRICLE RV Basal diam:  3.60 cm RV Mid diam:    3.90 cm RV S prime:     10.90 cm/s TAPSE (M-mode): 1.8 cm LEFT ATRIUM             Index        RIGHT ATRIUM          Index LA diam:        3.20 cm 1.39 cm/m   RA Area:     8.52 cm LA  Vol (A2C):   27.7 ml 12.07 ml/m  RA Volume:   17.10 ml 7.45 ml/m LA Vol (A4C):   29.7 ml 12.95 ml/m LA Biplane Vol: 31.2 ml 13.60 ml/m  AORTIC VALVE AV Area (Vmax):    2.84 cm AV Area (Vmean):   2.66 cm AV Area (VTI):     2.70 cm AV Vmax:           114.00 cm/s AV Vmean:          79.600 cm/s AV VTI:            0.206 m AV Peak Grad:      5.2 mmHg AV Mean Grad:      3.0 mmHg LVOT Vmax:         103.00 cm/s LVOT Vmean:        67.400 cm/s LVOT VTI:          0.177 m LVOT/AV VTI ratio: 0.86  AORTA Ao Root diam: 2.90 cm Ao Asc diam:  2.90 cm MITRAL VALVE MV Area (PHT): 3.37 cm    SHUNTS MV Decel Time: 225 msec    Systemic VTI:  0.18 m MV E velocity: 76.20 cm/s  Systemic Diam: 2.00 cm MV A velocity: 64.10 cm/s MV E/A ratio:  1.19 Manish Patwardhan MD Electronically signed by Vernell Leep MD Signature Date/Time: 03/21/2022/2:08:19 PM    Final      I personally reviewed the cath images.  Total LAD- reopened with stents. Residual severe 2 vessel CAD  Review of Systems  Constitutional:  Positive for diaphoresis. Negative for activity change and fatigue.  Respiratory:  Positive for shortness of breath.   Cardiovascular:  Positive for chest pain.  Gastrointestinal:  Positive for abdominal pain ("reflux/ heartburn").  Genitourinary:  Negative for difficulty urinating.  Neurological:  Negative for syncope and weakness.  Hematological:  Negative for adenopathy. Does not bruise/bleed easily.   Blood pressure 117/67, pulse 100, temperature 98.2 F (36.8 C), temperature source Oral, resp. rate 15, height '5\' 10"'  (1.778 m), weight 113.3 kg, SpO2 94 %. Physical Exam Vitals reviewed.  Constitutional:  General: He is not in acute distress.    Appearance: Normal appearance.  HENT:     Head: Normocephalic and atraumatic.  Eyes:     General: No scleral icterus.    Extraocular Movements: Extraocular movements intact.  Cardiovascular:     Rate and Rhythm: Normal rate and regular rhythm.     Pulses:  Normal pulses.     Heart sounds: Normal heart sounds. No murmur heard.    No friction rub. No gallop.     Comments: Normal Left Allen's test Pulmonary:     Effort: Pulmonary effort is normal. No respiratory distress.     Breath sounds: Normal breath sounds.  Abdominal:     General: There is no distension.     Palpations: Abdomen is soft.  Musculoskeletal:        General: No swelling.  Skin:    General: Skin is warm and dry.  Neurological:     General: No focal deficit present.     Mental Status: He is alert and oriented to person, place, and time.     Cranial Nerves: No cranial nerve deficit.     Motor: No weakness.     Assessment/Plan: 36 yo man with a history of hypertension, asthma, gout, nephrolithiasis, panic attacks, and a family history of CAD.  No prior cardiac history.  Presented last night with a STEMI due to occluded LAD.  Had emergent PCi with good result, but has residual disease in RCA and circumflex/ ramus distributions.  Ef 40% with anteroseptal hypokinesis/ akinesis.  The question now is how to best manage the residual disease in the circumflex and RCA.  Both PCA and CABG are reasonable options for Ramus and RCA.  The circumflex is more problematic. Gives rise to 2 small OMs.  I think OM1 is too small to graft.  Om2 is probably large enough but is a poor quality target.  In my opinion CABG is best option but PCI is a reasonable alternative.  Would use arterial grafting with LIMA to Ramus and radial and/or RIMA to other targets.    I discussed the general nature of the procedure, including the need for general anesthesia, the incisions to be used, the use of cardiopulmonary bypass, and the use of drainage tubes and pacer wires postoperatively with Michael Hart. We discussed the expected hospital stay, overall recovery and short and long term outcomes.  I informed him of the indications, risks, benefits and alternatives with him.  He understands the risks include, but are  not limited to death, stroke, MI, DVT/PE, bleeding, possible need for transfusion, infections, cardiac arrhythmias, need for circulatory support, as well as other organ system dysfunction including respiratory, renal, or GI complications.   He will think over his options.  If he chooses CABG will need to allow ~ 5 days for recovery of stunned myocardium prior to surgery  Melrose Nakayama 03/22/2022, 1:44 PM

## 2022-03-22 NOTE — Plan of Care (Signed)

## 2022-03-22 NOTE — Consult Note (Signed)
Reason for Consult: CAD s/p STEMI Referring Physician: Dr. Azucena Cecil is an 36 y.o. male.  HPI: Michael Hart presented with a cc/o CP  Michael Hart is a 36 yo man with a history of hypertension, asthma, gout, nephrolithiasis, panic attacks, and a family history of CAD.  Smoked briefly but quit 9 years ago.   He had onset of CP around 5 PM yesterday evening. Waxed and waned initially and he thought is was indigestion. Around midnight pain worsened to 10/10, he laid down and became diaphoretic.  Coworkers called EMS. Brought to ED as a code STEMI.  Underwent emergent cath which showed occluded LAD and severe 3 vessel CAD. Dr. Virgina Jock was able to reopen LAD with aspiration thrombectomy, PTCA and stent placement.  Has had residual 1/10 CP unchanged with activity since procedure.  In retrospect he has had similar sensations, much less severe over the past few months.  Past Medical History:  Diagnosis Date   Allergy    Asthma    Gout    Kidney stone    Panic attacks    nocturnal, controls with self directed biofeedback   Primary hypertension 03/03/2022    Past Surgical History:  Procedure Laterality Date   NO PAST SURGERIES      Family History  Problem Relation Age of Onset   Breast cancer Mother    Lung cancer Father    Heart disease Father        CABG   COPD Father    Hypertension Other    Stroke Other    Migraines Neg Hx    Headache Neg Hx    Sleep apnea Neg Hx     Social History:  reports that he has quit smoking. He quit smokeless tobacco use about 10 years ago. He reports current alcohol use of about 2.0 standard drinks of alcohol per week. He reports that he does not use drugs.  Allergies:  Allergies  Allergen Reactions   Penicillins Shortness Of Breath, Swelling and Other (See Comments)    Childhood reaction   Sulfa Antibiotics Shortness Of Breath, Swelling and Other (See Comments)    Childhood reaction    Medications: Scheduled:   aspirin EC  81 mg Oral Daily   Chlorhexidine Gluconate Cloth  6 each Topical Daily   empagliflozin  10 mg Oral Daily   losartan  12.5 mg Oral Daily   metoprolol succinate  12.5 mg Oral Daily   rosuvastatin  20 mg Oral Daily   sodium chloride flush  3 mL Intravenous Q12H   ticagrelor  90 mg Oral BID    Results for orders placed or performed during the hospital encounter of 03/21/22 (from the past 48 hour(s))  Resp Panel by RT-PCR (Flu A&B, Covid) Anterior Nasal Swab     Status: None   Collection Time: 03/21/22  1:04 AM   Specimen: Anterior Nasal Swab  Result Value Ref Range   SARS Coronavirus 2 by RT PCR NEGATIVE NEGATIVE    Comment: (NOTE) SARS-CoV-2 target nucleic acids are NOT DETECTED.  The SARS-CoV-2 RNA is generally detectable in upper respiratory specimens during the acute phase of infection. The lowest concentration of SARS-CoV-2 viral copies this assay can detect is 138 copies/mL. A negative result does not preclude SARS-Cov-2 infection and should not be used as the sole basis for treatment or other patient management decisions. A negative result may occur with  improper specimen collection/handling, submission of specimen other than nasopharyngeal swab, presence of viral  mutation(s) within the areas targeted by this assay, and inadequate number of viral copies(<138 copies/mL). A negative result must be combined with clinical observations, patient history, and epidemiological information. The expected result is Negative.  Fact Sheet for Patients:  EntrepreneurPulse.com.au  Fact Sheet for Healthcare Providers:  IncredibleEmployment.be  This test is no t yet approved or cleared by the [place] FDA and  has been authorized for detection and/or diagnosis of SARS-CoV-2 by FDA under an Emergency Use Authorization (EUA). This EUA will remain  in effect (meaning this test can be used) for the duration of the COVID-19 declaration under  Section 564(b)(1) of the Act, 21 U.S.C.section 360bbb-3(b)(1), unless the authorization is terminated  or revoked sooner.       Influenza A by PCR NEGATIVE NEGATIVE   Influenza B by PCR NEGATIVE NEGATIVE    Comment: (NOTE) The Xpert Xpress SARS-CoV-2/FLU/RSV plus assay is intended as an aid in the diagnosis of influenza from Nasopharyngeal swab specimens and should not be used as a sole basis for treatment. Nasal washings and aspirates are unacceptable for Xpert Xpress SARS-CoV-2/FLU/RSV testing.  Fact Sheet for Patients: EntrepreneurPulse.com.au  Fact Sheet for Healthcare Providers: IncredibleEmployment.be  This test is not yet approved or cleared by the [place] FDA and has been authorized for detection and/or diagnosis of SARS-CoV-2 by FDA under an Emergency Use Authorization (EUA). This EUA will remain in effect (meaning this test can be used) for the duration of the COVID-19 declaration under Section 564(b)(1) of the Act, 21 U.S.C. section 360bbb-3(b)(1), unless the authorization is terminated or revoked.  Performed at [facility], [place] [address], [place], [place] [address]   Hemoglobin A1c     Status: None   Collection Time: 03/21/22  1:05 AM  Result Value Ref Range   Hgb A1c MFr Bld 5.2 4.8 - 5.6 %    Comment: (NOTE) Pre diabetes:          5.7%-6.4%  Diabetes:              >6.4%  Glycemic control for   <7.0% adults with diabetes    Mean Plasma Glucose 102.54 mg/dL    Comment: Performed at [facility] [address], [place], [place] [address]  CBC with Differential/Platelet     Status: Abnormal   Collection Time: 03/21/22  1:05 AM  Result Value Ref Range   WBC 10.2 4.0 - 10.5 K/uL   RBC 4.79 4.22 - 5.81 MIL/uL   Hemoglobin 14.8 13.0 - 17.0 g/dL   HCT 41.6 39.0 - 52.0 %   MCV 86.8 80.0 - 100.0 fL   MCH 30.9 26.0 - 34.0 pg   MCHC 35.6 30.0 - 36.0 g/dL   RDW 11.8 11.5 - 15.5 %   Platelets 336 150 - 400  K/uL   nRBC 0.0 0.0 - 0.2 %   Neutrophils Relative % 43 %   Neutro Abs 4.4 1.7 - 7.7 K/uL   Lymphocytes Relative 43 %   Lymphs Abs 4.3 (H) 0.7 - 4.0 K/uL   Monocytes Relative 10 %   Monocytes Absolute 1.1 (H) 0.1 - 1.0 K/uL   Eosinophils Relative 2 %   Eosinophils Absolute 0.2 0.0 - 0.5 K/uL   Basophils Relative 1 %   Basophils Absolute 0.1 0.0 - 0.1 K/uL   Immature Granulocytes 1 %   Abs Immature Granulocytes 0.05 0.00 - 0.07 K/uL    Comment: Performed at [facility] [address], [place], [place]  [place]     Status: None   Collection Time: 03/21/22  1:05 AM  Result Value Ref Range   Prothrombin Time 13.4 11.4 - 15.2 seconds   INR 1.0 0.8 - 1.2    Comment: (NOTE) INR goal varies based on device and disease states. Performed at [facility], [place] [address], [place], [place] [address]   APTT     Status: None   Collection Time: 03/21/22  1:05 AM  Result Value Ref Range   aPTT 24 24 - 36 seconds    Comment: Performed at [facility] [address] [place], [place] [address]  Comprehensive metabolic panel     Status: Abnormal   Collection Time: 03/21/22  1:05 AM  Result Value Ref Range   Sodium 139 135 - 145 mmol/L   Potassium 3.5 3.5 - 5.1 mmol/L   Chloride 104 98 - 111 mmol/L   CO2 21 (L) 22 - 32 mmol/L   Glucose, Bld 137 (H) 70 - 99 mg/dL    Comment: Glucose reference range applies only to samples taken after fasting for at least 8 hours.   BUN 17 6 - 20 mg/dL   Creatinine, Ser 1.16 0.61 - 1.24 mg/dL   Calcium 9.5 8.9 - 10.3 mg/dL   Total Protein 6.7 6.5 - 8.1 g/dL   Albumin 3.9 3.5 - 5.0 g/dL   AST 39 15 - 41 U/L   ALT 44 0 - 44 U/L   Alkaline Phosphatase 37 (L) 38 - 126 U/L   Total Bilirubin 0.6 0.3 - 1.2 mg/dL   GFR, Estimated >60 >60 mL/min    Comment: (NOTE) Calculated using the CKD-EPI Creatinine Equation (2021)    Anion gap 14 5 - 15    Comment: Performed at [facility] [address] [place], [place] [address]   Troponin I (High Sensitivity)     Status: Abnormal   Collection Time: 03/21/22  1:05 AM  Result Value Ref Range   Troponin I (High Sensitivity) 129 (HH) <18 ng/L    Comment: CRITICAL RESULT CALLED TO, READ BACK BY AND VERIFIED WITH P CAMERON RN 03/21/2022 0204 B NUNNERY (NOTE) Elevated high sensitivity troponin I (hsTnI) values and significant  changes across serial measurements may suggest ACS but many other  chronic and acute conditions are known to elevate hsTnI results.  Refer to the "Links" section for chest pain algorithms and additional  guidance. Performed at [facility], [place] [address] [place], [place] [address]   Lipid panel     Status: Abnormal   Collection Time: 03/21/22  1:05 AM  Result Value Ref Range   Cholesterol 181 0 - 200 mg/dL   Triglycerides 432 (H) <150 mg/dL   HDL 29 (L) >40 mg/dL   Total CHOL/HDL Ratio 6.2 RATIO   VLDL UNABLE TO CALCULATE IF TRIGLYCERIDE OVER 400 mg/dL 0 - 40 mg/dL   LDL Cholesterol UNABLE TO CALCULATE IF TRIGLYCERIDE OVER 400 mg/dL 0 - 99 mg/dL    Comment:        Total Cholesterol/HDL:CHD Risk Coronary Heart Disease Risk Table                     Men   Women  1/2 Average Risk   3.4   3.3  Average Risk       5.0   4.4  2 X Average Risk   9.6   7.1  3 X Average Risk  23.4   11.0        Use the calculated Patient Ratio above and the CHD Risk Table to determine the patient's CHD Risk.        ATP III CLASSIFICATION (LDL):  <100     mg/dL   Optimal  100-129  mg/dL   Near or Above                    Optimal  130-159  mg/dL   Borderline  160-189  mg/dL   High  >190     mg/dL   Very High Performed at [facility] [address], [place], [place] [address]   LDL cholesterol, direct     Status: Abnormal   Collection Time: 03/21/22  1:05 AM  Result Value Ref Range   Direct LDL 109 (H) 0 - 99 mg/dL    Comment: Performed at [facility] [address] [place], [place] [address]  I-STAT, [place] [address]     Status: Abnormal    Collection Time: 03/21/22  1:28 AM  Result Value Ref Range   Sodium 139 135 - 145 mmol/L   Potassium 3.4 (L) 3.5 - 5.1 mmol/L   Chloride 104 98 - 111 mmol/L   BUN 15 6 - 20 mg/dL   Creatinine, Ser 0.80 0.61 - 1.24 mg/dL   Glucose, Bld 155 (H) 70 - 99 mg/dL    Comment: Glucose reference range applies only to samples taken after fasting for at least 8 hours.   Calcium, Ion 1.16 1.15 - 1.40 mmol/L   TCO2 21 (L) 22 - 32 mmol/L   Hemoglobin 13.3 13.0 - 17.0 g/dL   HCT 39.0 39.0 - 52.0 %  POCT Activated clotting time     Status: None   Collection Time: 03/21/22  1:38 AM  Result Value Ref Range   Activated Clotting Time 588 seconds    Comment: Reference range 74-137 seconds for patients not on anticoagulant therapy.  POCT Activated clotting time     Status: None   Collection Time: 03/21/22  1:51 AM  Result Value Ref Range   Activated Clotting Time 600 seconds    Comment: Reference range 74-137 seconds for patients not on anticoagulant therapy.  POCT Activated clotting time     Status: None   Collection Time: 03/21/22  2:32 AM  Result Value Ref Range   Activated Clotting Time 251 seconds    Comment: Reference range 74-137 seconds for patients not on anticoagulant therapy.  MRSA Next Gen by PCR, Nasal     Status: None   Collection Time: 03/21/22  3:52 AM   Specimen: Nasal Mucosa; Nasal Swab  Result Value Ref Range   MRSA by PCR Next Gen NOT DETECTED NOT DETECTED    Comment: (NOTE) The GeneXpert MRSA Assay (FDA approved for NASAL specimens only), is one component of a comprehensive MRSA colonization surveillance program. It is not intended to diagnose MRSA infection nor to guide or monitor treatment for MRSA infections. Test performance is not FDA approved in patients less than 55 years old. Performed at [facility], [place] [address] [place], [place] [address]   Basic metabolic panel     Status: Abnormal   Collection Time: 03/21/22  4:08 AM  Result Value Ref Range   Sodium  135 135 - 145 mmol/L   Potassium 4.0 3.5 - 5.1 mmol/L   Chloride 101 98 - 111 mmol/L   CO2 22 22 - 32 mmol/L  Glucose, Bld 136 (H) 70 - 99 mg/dL    Comment: Glucose reference range applies only to samples taken after fasting for at least 8 hours.   BUN 13 6 - 20 mg/dL   Creatinine, Ser 1.05 0.61 - 1.24 mg/dL   Calcium 9.5 8.9 - 10.3 mg/dL   GFR, Estimated >60 >60 mL/min    Comment: (NOTE) Calculated using the CKD-EPI Creatinine Equation (2021)    Anion gap 12 5 - 15    Comment: Performed at [facility] [address], [place], [place] [address]  CBC     Status: Abnormal   Collection Time: 03/21/22  4:08 AM  Result Value Ref Range   WBC 13.2 (H) 4.0 - 10.5 K/uL   RBC 4.95 4.22 - 5.81 MIL/uL   Hemoglobin 15.3 13.0 - 17.0 g/dL   HCT 42.9 39.0 - 52.0 %   MCV 86.7 80.0 - 100.0 fL   MCH 30.9 26.0 - 34.0 pg   MCHC 35.7 30.0 - 36.0 g/dL   RDW 11.8 11.5 - 15.5 %   Platelets 298 150 - 400 K/uL   nRBC 0.0 0.0 - 0.2 %    Comment: Performed at [facility], [place] [address], [place], [place] [address]  Troponin I (High Sensitivity)     Status: Abnormal   Collection Time: 03/21/22 10:07 AM  Result Value Ref Range   Troponin I (High Sensitivity) >24,000 (HH) <18 ng/L    Comment: CRITICAL VALUE NOTED.  VALUE IS CONSISTENT WITH PREVIOUSLY REPORTED AND CALLED VALUE. (NOTE) Elevated high sensitivity troponin I (hsTnI) values and significant  changes across serial measurements may suggest ACS but many other  chronic and acute conditions are known to elevate hsTnI results.  Refer to the "Links" section for chest pain algorithms and additional  guidance.  Due to the decreased utility in serial monitoring of hsTroponin once the value exceeds 24,000 ng/L, orders for the next 24 hours will be discontinued. Confirmation testing may be ordered. HS Troponin lab(s) may be reordered after 24 hours. Performed at [facility], [place] [address] [place], [place] [address]   Brain  natriuretic peptide     Status: None   Collection Time: 03/21/22 11:00 AM  Result Value Ref Range   B Natriuretic Peptide 10.5 0.0 - 100.0 pg/mL    Comment: Performed at [facility] [address], [place],  [address]  CBC     Status: Abnormal   Collection Time: 03/21/22  1:09 PM  Result Value Ref Range   WBC 13.7 (H) 4.0 - 10.5 K/uL   RBC 4.77 4.22 - 5.81 MIL/uL   Hemoglobin 15.1 13.0 - 17.0 g/dL   HCT 40.6 39.0 - 52.0 %   MCV 85.1 80.0 - 100.0 fL   MCH 31.7 26.0 - 34.0 pg   MCHC 37.2 (H) 30.0 - 36.0 g/dL   RDW 11.7 11.5 - 15.5 %   Platelets 286 150 - 400 K/uL   nRBC 0.0 0.0 - 0.2 %    Comment: Performed at [facility], [place] [address] [place], [place] [address]  Glucose, capillary     Status: Abnormal   Collection Time: 03/21/22  3:55 PM  Result Value Ref Range   Glucose-Capillary 127 (H) 70 - 99 mg/dL    Comment: Glucose reference range applies only to samples taken after fasting for at least 8 hours.  CBC     Status: Abnormal   Collection Time: 03/22/22 11:24  AM  Result Value Ref Range   WBC 13.7 (H) 4.0 - 10.5 K/uL   RBC 4.98 4.22 - 5.81 MIL/uL   Hemoglobin 15.6 13.0 - 17.0 g/dL   HCT 43.1 39.0 - 52.0 %   MCV 86.5 80.0 - 100.0 fL   MCH 31.3 26.0 - 34.0 pg   MCHC 36.2 (H) 30.0 - 36.0 g/dL   RDW 11.9 11.5 - 15.5 %   Platelets 289 150 - 400 K/uL   nRBC 0.0 0.0 - 0.2 %    Comment: Performed at [facility] [address], [place], [place] [address]  Basic metabolic panel     Status: Abnormal   Collection Time: 03/22/22 11:24 AM  Result Value Ref Range   Sodium 135 135 - 145 mmol/L   Potassium 3.8 3.5 - 5.1 mmol/L   Chloride 101 98 - 111 mmol/L   CO2 27 22 - 32 mmol/L   Glucose, Bld 95 70 - 99 mg/dL    Comment: Glucose reference range applies only to samples taken after fasting for at least 8 hours.   BUN 10 6 - 20 mg/dL   Creatinine, Ser 1.07 0.61 - 1.24 mg/dL   Calcium 8.7 (L) 8.9 - 10.3 mg/dL   GFR, Estimated >60 >60 mL/min     Comment: (NOTE) Calculated using the CKD-EPI Creatinine Equation (2021)    Anion gap 7 5 - 15    Comment: Performed at [facility] [address] [place], [place] [address]  Brain natriuretic peptide     Status: Abnormal   Collection Time: 03/22/22 11:24 AM  Result Value Ref Range   B Natriuretic Peptide 226.2 (H) 0.0 - 100.0 pg/mL    Comment: Performed at [facility] [address], [place], [place] [address]    DG CHEST PORT 1 VIEW  Result Date: 03/22/2022 CLINICAL DATA:  Mild cardial infarction. EXAM: PORTABLE CHEST 1 VIEW COMPARISON:  02/14/2021. FINDINGS: Cardiac silhouette is normal in size and configuration. Normal mediastinal and hilar contours. Clear lungs.  No convincing pleural effusion.  No pneumothorax. Skeletal structures are grossly intact. IMPRESSION: No active disease. Electronically Signed   By: Lajean Manes M.D.   On: 03/22/2022 13:30   CARDIAC CATHETERIZATION  Result Date: 03/21/2022 Images from the original result were not included. LM: Normal LAD: Prox 100% occlusion          Mid 80-90% stenoses          Diffuse 80% diag disease Ramus: Large vessel. Prox 75% stenosis Lcx: Mid 95%, OM1 95% bifurcation stenosis RCA: Mid 80%, followed by 75% stenoses          Distal 80% stenosis  Successful percutaneous coronary intervention prox-mid LAD     Aspiration thrombectomy, PTCA, and overlapping stents placement     Synergy DES 4.0X12 mm, 3.5X48 mm, 3.0X16 mm     This was necessitated by flow limiting lesions beyond the culprit lesion     Post dilataation with 4.0X15 mm Sharon balloon upto 18 atm     We will consider nonculprit PCI to remaining vessels during index hospitalization. Michael Mormon, MD Pager: [phone] Office: [phone]  ECHOCARDIOGRAM COMPLETE  Result Date: 03/21/2022    ECHOCARDIOGRAM REPORT   Patient Name:   Michael Hart Date of Exam: 03/21/2022 Medical Rec #:  [id number]        Height:       70.0 in Accession #:    [id number]  Weight:       249.8 lb Date of Birth:  Aug 06, 1985        BSA:          2.294 m Patient Age:    36 years         BP:           116/85 mmHg Patient Gender: M                HR:           80 bpm. Exam Location:  Inpatient Procedure: 2D Echo, Cardiac Doppler, Color Doppler and Intracardiac            Opacification Agent Indications:     Acute MI  History:         Patient has no prior history of Echocardiogram examinations.                  Risk Factors:Dyslipidemia and Former Smoker.  Sonographer:     Clayton Lefort RDCS (AE) Referring Phys:  [facility] Diagnosing Phys: Michael Leep MD IMPRESSIONS  1. Left ventricular ejection fraction, by estimation, is 40 to 45%. The left ventricle has mildly decreased function. The left ventricle demonstrates regional wall motion abnormalities (see scoring diagram/findings for description). There is mild left ventricular hypertrophy. Left ventricular diastolic parameters are indeterminate. There is severe hypokinesis of the left ventricular, mid-apical anteroseptal wall and anterior wall. There is akinesis of the left ventricular, apical segment.  2. Right ventricular systolic function is normal. The right ventricular size is normal.  3. The mitral valve is normal in structure. No evidence of mitral valve regurgitation. No evidence of mitral stenosis.  4. The aortic valve is normal in structure. Aortic valve regurgitation is not visualized. No aortic stenosis is present. FINDINGS  Left Ventricle: Left ventricular ejection fraction, by estimation, is 40 to 45%. The left ventricle has mildly decreased function. The left ventricle demonstrates regional wall motion abnormalities. Severe hypokinesis of the left ventricular, mid-apical  anteroseptal wall and anterior wall. Definity contrast agent was given IV to delineate the left ventricular endocardial borders. The left ventricular internal cavity size was normal in size. There is mild left ventricular hypertrophy. Left  ventricular diastolic parameters are indeterminate. Right Ventricle: The right ventricular size is normal. No increase in right ventricular wall thickness. Right ventricular systolic function is normal. Left Atrium: Left atrial size was normal in size. Right Atrium: Right atrial size was normal in size. Pericardium: There is no evidence of pericardial effusion. Mitral Valve: The mitral valve is normal in structure. No evidence of mitral valve regurgitation. No evidence of mitral valve stenosis. Tricuspid Valve: The tricuspid valve is normal in structure. Tricuspid valve regurgitation is not demonstrated. No evidence of tricuspid stenosis. Aortic Valve: The aortic valve is normal in structure. Aortic valve regurgitation is not visualized. No aortic stenosis is present. Aortic valve mean gradient measures 3.0 mmHg. Aortic valve peak gradient measures 5.2 mmHg. Aortic valve area, by VTI measures 2.70 cm. Pulmonic Valve: The pulmonic valve was normal in structure. Pulmonic valve regurgitation is not visualized. No evidence of pulmonic stenosis. Aorta: The aortic root is normal in size and structure. IAS/Shunts: The interatrial septum was not assessed.  LEFT VENTRICLE PLAX 2D LVIDd:         4.90 cm      Diastology LVIDs:         3.20 cm      LV e' medial:    5.98 cm/s LV  PW:         1.10 cm      LV E/e' medial:  12.7 LV IVS:        1.20 cm      LV e' lateral:   9.79 cm/s LVOT diam:     2.00 cm      LV E/e' lateral: 7.8 LV SV:         56 LV SV Index:   24 LVOT Area:     3.14 cm  LV Volumes (MOD) LV vol d, MOD A2C: 83.8 ml LV vol d, MOD A4C: 119.0 ml LV vol s, MOD A2C: 46.1 ml LV vol s, MOD A4C: 68.4 ml LV SV MOD A2C:     37.7 ml LV SV MOD A4C:     119.0 ml LV SV MOD BP:      48.3 ml RIGHT VENTRICLE RV Basal diam:  3.60 cm RV Mid diam:    3.90 cm RV S prime:     10.90 cm/s TAPSE (M-mode): 1.8 cm LEFT ATRIUM             Index        RIGHT ATRIUM          Index LA diam:        3.20 cm 1.39 cm/m   RA Area:     8.52 cm LA  Vol (A2C):   27.7 ml 12.07 ml/m  RA Volume:   17.10 ml 7.45 ml/m LA Vol (A4C):   29.7 ml 12.95 ml/m LA Biplane Vol: 31.2 ml 13.60 ml/m  AORTIC VALVE AV Area (Vmax):    2.84 cm AV Area (Vmean):   2.66 cm AV Area (VTI):     2.70 cm AV Vmax:           114.00 cm/s AV Vmean:          79.600 cm/s AV VTI:            0.206 m AV Peak Grad:      5.2 mmHg AV Mean Grad:      3.0 mmHg LVOT Vmax:         103.00 cm/s LVOT Vmean:        67.400 cm/s LVOT VTI:          0.177 m LVOT/AV VTI ratio: 0.86  AORTA Ao Root diam: 2.90 cm Ao Asc diam:  2.90 cm MITRAL VALVE MV Area (PHT): 3.37 cm    SHUNTS MV Decel Time: 225 msec    Systemic VTI:  0.18 m MV E velocity: 76.20 cm/s  Systemic Diam: 2.00 cm MV A velocity: 64.10 cm/s MV E/A ratio:  1.19 Michael Patwardhan MD Electronically signed by Michael Leep MD Signature Date/Time: 03/21/2022/2:08:19 PM    Final      I personally reviewed the cath images.  Total LAD- reopened with stents. Residual severe 2 vessel CAD  Review of Systems  Constitutional:  Positive for diaphoresis. Negative for activity change and fatigue.  Respiratory:  Positive for shortness of breath.   Cardiovascular:  Positive for chest pain.  Gastrointestinal:  Positive for abdominal pain ("reflux/ heartburn").  Genitourinary:  Negative for difficulty urinating.  Neurological:  Negative for syncope and weakness.  Hematological:  Negative for adenopathy. Does not bruise/bleed easily.   Blood pressure 117/67, pulse 100, temperature 98.2 F (36.8 C), temperature source Oral, resp. rate 15, height '5\' 10"'  (1.778 m), weight 113.3 kg, SpO2 94 %. Physical Exam Vitals reviewed.  Constitutional:  General: He is not in acute distress.    Appearance: Normal appearance.  HENT:     Head: Normocephalic and atraumatic.  Eyes:     General: No scleral icterus.    Extraocular Movements: Extraocular movements intact.  Cardiovascular:     Rate and Rhythm: Normal rate and regular rhythm.     Pulses:  Normal pulses.     Heart sounds: Normal heart sounds. No murmur heard.    No friction rub. No gallop.     Comments: Normal Left Allen's test Pulmonary:     Effort: Pulmonary effort is normal. No respiratory distress.     Breath sounds: Normal breath sounds.  Abdominal:     General: There is no distension.     Palpations: Abdomen is soft.  Musculoskeletal:        General: No swelling.  Skin:    General: Skin is warm and dry.  Neurological:     General: No focal deficit present.     Mental Status: He is alert and oriented to person, place, and time.     Cranial Nerves: No cranial nerve deficit.     Motor: No weakness.     Assessment/Plan: 36 yo man with a history of hypertension, asthma, gout, nephrolithiasis, panic attacks, and a family history of CAD.  No prior cardiac history.  Presented last night with a STEMI due to occluded LAD.  Had emergent PCi with good result, but has residual disease in RCA and circumflex/ ramus distributions.  Ef 40% with anteroseptal hypokinesis/ akinesis.  The question now is how to best manage the residual disease in the circumflex and RCA.  Both PCA and CABG are reasonable options for Ramus and RCA.  The circumflex is more problematic. Gives rise to 2 small OMs.  I think OM1 is too small to graft.  Om2 is probably large enough but is a poor quality target.  In my opinion CABG is best option but PCI is a reasonable alternative.  Would use arterial grafting with LIMA to Ramus and radial and/or RIMA to other targets.    I discussed the general nature of the procedure, including the need for general anesthesia, the incisions to be used, the use of cardiopulmonary bypass, and the use of drainage tubes and pacer wires postoperatively with Michael Hart. We discussed the expected hospital stay, overall recovery and short and long term outcomes.  I informed him of the indications, risks, benefits and alternatives with him.  He understands the risks include, but are  not limited to death, stroke, MI, DVT/PE, bleeding, possible need for transfusion, infections, cardiac arrhythmias, need for circulatory support, as well as other organ system dysfunction including respiratory, renal, or GI complications.   He will think over his options.  If he chooses CABG will need to allow ~ 5 days for recovery of stunned myocardium prior to surgery  Melrose Nakayama 03/22/2022, 1:44 PM

## 2022-03-22 NOTE — Plan of Care (Signed)

## 2022-03-22 NOTE — Progress Notes (Addendum)
Subjective:  Feels well. Residual, unchanged, 1/10 chest pain since admission, not changed with exertion such as walking to the bathroom.  Objective:  Vital Signs in the last 24 hours: Temp:  [98.2 F (36.8 C)-98.5 F (36.9 C)] 98.2 F (36.8 C) (10/01 0800) Pulse Rate:  [82-105] 96 (10/01 0600) Resp:  [13-26] 22 (10/01 0900) BP: (97-141)/(60-96) 122/65 (10/01 0900) SpO2:  [90 %-99 %] 96 % (10/01 0900)  Intake/Output from previous day: 09/30 0701 - 10/01 0700 In: 778.9 [P.O.:100; I.V.:678.9] Out: 2450 [Urine:2450]  Physical Exam Vitals and nursing note reviewed.  Constitutional:      General: He is not in acute distress. Neck:     Vascular: No JVD.  Cardiovascular:     Rate and Rhythm: Normal rate and regular rhythm.     Heart sounds: Normal heart sounds. No murmur heard. Pulmonary:     Effort: Pulmonary effort is normal.     Breath sounds: Normal breath sounds. No wheezing or rales.  Musculoskeletal:     Right lower leg: No edema.     Left lower leg: No edema.      Imaging/tests reviewed and independently interpreted: CXR ordered   Cardiac Studies:  Telemetry 03/22/2022: Brief NSVT 3-4 beats  EKG 03/21/2022: Sinus rhythm Anteroseptal infarct, age undetermined  Coronary intervention 03/21/2022: LM: Normal LAD: Prox 100% occlusion          Mid 80-90% stenoses          Diffuse 80% diag disease Ramus: Large vessel. Prox 75% stenosis Lcx: Mid 95%, OM1 95% bifurcation stenosis RCA: Mid 80%, followed by 75% stenoses          Distal 80% stenosis   Successful percutaneous coronary intervention prox-mid LAD     Aspiration thrombectomy, PTCA, and overlapping stents placement     Synergy DES 4.0X12 mm, 3.5X48 mm, 3.0X16 mm     This was necessitated by flow limiting lesions beyond the culprit lesion     Post dilataation with 4.0X15 mm [place] balloon upto 18 atm      We will consider nonculprit PCI to remaining vessels during index hospitalization.    Echocardiogram  03/21/2022:  1. Left ventricular ejection fraction, by estimation, is 40 to 45%. The  left ventricle has mildly decreased function. The left ventricle  demonstrates regional wall motion abnormalities (see scoring  diagram/findings for description). There is mild left  ventricular hypertrophy. Left ventricular diastolic parameters are  indeterminate. There is severe hypokinesis of the left ventricular,  mid-apical anteroseptal wall and anterior wall. There is akinesis of the  left ventricular, apical segment.   2. Right ventricular systolic function is normal. The right ventricular  size is normal.   3. The mitral valve is normal in structure. No evidence of mitral valve  regurgitation. No evidence of mitral stenosis.   4. The aortic valve is normal in structure. Aortic valve regurgitation is  not visualized. No aortic stenosis is present.     Assessment & Recommendations:  36 y.o. Caucasian male  with hypertension, hyperlipidemia, family h/o CAD with STEMI  STEMI: Culprit prox LAD with downstream severe lesions in mid LAD, also flow limiting S/p Aspiration thrombectomy, PTCA, and overlapping stents placement prox-mid LAD     Synergy DES 4.0X12 mm, 3.5X48 mm, 3.0X16 mm Residual severe stenoses in prox ramus, prox Lcx/OM1, prox, distal RCA.  Diffuse disease diagonal 1 best treated medically. EF 40-45%, severe hypokinesis/akinesis LAD territory. Continue DAPT with Aspirin, Brilinta. Continue Crestor 20 mg daily. Lipoprotein )a) pending.  Continue losartan 12.5 mg daily. Will consider changing to [facility] later. Add metoprolol succinate 12. 5 mg daily, Jardiance 10 mg daily.  Residual 1/10 chest pain unlikely active angina.  Okay to walk with cardiac rehab today.  Unfortunate situation where he would have been best served by CABG had he presented with stable symptoms prior to STEMI. Prox-mid LAD PCI with necessitated by his STEMI presentation with flow limiting lesions beyond the culprit  in prox LAD. As a result, he may not have any remaining good target for LIMA-LAD. However, he still has several remaining non-culprit severe stenoses that warrant complete revascularization. I have requested CT surgery to see if he still could benefit from surgical revascularization to the remaining vessels. Caveat will be that interruption of DAPT will be very high risk due to overlapping stents in prox-mid LAD placed in the setting of STEMI. For any surgical revascularization, he may likely need bridging with cangrelor. If CABG not an option, will consider PCI to remaining non-culprit vessels, possibly in staged manner.   HFrEF: Euvolemic. EF 40-45%. Meds as above. With EF >35%, unlikely to be approved for Life Vest.  Mixed hyperlipidemia: TG >400, LDL could not be calculated. LDL 151 before. Will check direct LDL. Continue Crestor 20 mg daily. Lipoprotein (a) pending. Will consider enrolling in EVOLVE-MI if lipoprotein (a) normal, OCEAN (A) if lipoprotein (a) elevated.  Will transfer to progressive unit today.   Nigel Mormon, MD Pager: [phone] Office: [phone]

## 2022-03-23 ENCOUNTER — Encounter (HOSPITAL_COMMUNITY): Payer: Self-pay | Admitting: Cardiology

## 2022-03-23 LAB — LIPID PANEL
Cholesterol: 160 mg/dL (ref 0–200)
HDL: 38 mg/dL — ABNORMAL LOW (ref >40)
LDL Cholesterol: 82 mg/dL (ref 0–99)
Total CHOL/HDL Ratio: 4.2 RATIO
Triglycerides: 201 mg/dL — ABNORMAL HIGH (ref <150)
VLDL: 40 mg/dL (ref 0–40)

## 2022-03-23 LAB — CBC
HCT: 42.3 % (ref 39.0–52.0)
Hemoglobin: 15.3 g/dL (ref 13.0–17.0)
MCH: 31.4 pg (ref 26.0–34.0)
MCHC: 36.2 g/dL — ABNORMAL HIGH (ref 30.0–36.0)
MCV: 86.9 fL (ref 80.0–100.0)
Platelets: 278 10*3/uL (ref 150–400)
RBC: 4.87 MIL/uL (ref 4.22–5.81)
RDW: 11.9 % (ref 11.5–15.5)
WBC: 13.6 10*3/uL — ABNORMAL HIGH (ref 4.0–10.5)
nRBC: 0 % (ref 0.0–0.2)

## 2022-03-23 LAB — BASIC METABOLIC PANEL
Anion gap: 12 (ref 5–15)
BUN: 10 mg/dL (ref 6–20)
CO2: 24 mmol/L (ref 22–32)
Calcium: 9.2 mg/dL (ref 8.9–10.3)
Chloride: 99 mmol/L (ref 98–111)
Creatinine, Ser: 1.15 mg/dL (ref 0.61–1.24)
GFR, Estimated: >60 mL/min (ref >60)
Glucose, Bld: 104 mg/dL — ABNORMAL HIGH (ref 70–99)
Potassium: 4 mmol/L (ref 3.5–5.1)
Sodium: 135 mmol/L (ref 135–145)

## 2022-03-23 LAB — LDL CHOLESTEROL, DIRECT: Direct LDL: 96 mg/dL (ref 0–99)

## 2022-03-23 MED ORDER — TRAZODONE HCL 50 MG PO TABS
50.0000 mg | ORAL_TABLET | Freq: Every day | ORAL | Status: DC
  Administered 2022-03-23 – 2022-03-26 (×4): 50 mg via ORAL
  Filled 2022-03-23 (×4): qty 1

## 2022-03-23 MED ORDER — SODIUM CHLORIDE 0.9 % IV SOLN
0.7500 ug/kg/min | INTRAVENOUS | Status: DC
  Administered 2022-03-23 – 2022-03-26 (×6): 0.75 ug/kg/min via INTRAVENOUS
  Filled 2022-03-23 (×7): qty 50

## 2022-03-23 MED ORDER — ALLOPURINOL 100 MG PO TABS
100.0000 mg | ORAL_TABLET | Freq: Every day | ORAL | Status: DC
  Administered 2022-03-23 – 2022-03-31 (×8): 100 mg via ORAL
  Filled 2022-03-23 (×9): qty 1

## 2022-03-23 NOTE — Progress Notes (Signed)
CARDIAC REHAB PHASE I   Pt resting in bed feeling well this am. Pt ambulated with mobility team this am and tolerated well. Will return this afternoon as time allows to offer walk. Will provide needed education with pt once plan has been established cath vs.CABG. will continue to follow.   [id number]  Vanessa Barbara, RN BSN 03/23/2022 11:39 AM

## 2022-03-23 NOTE — Progress Notes (Signed)
CARDIAC REHAB PHASE I   PRE:  Rate/Rhythm: 97 SR  BP:  Sitting: 119/85      SaO2: 98 RA  MODE:  Ambulation: 400 ft   POST:  Rate/Rhythm: 102 ST  BP:  Sitting: 118/78      SaO2: 99 RA   Pt ambulated in hall independently with no c/p or sob. Back to room to bed with call bell and bedside table in reach. Will continue to follow.   [id number]  Vanessa Barbara, RN BSN 03/23/2022 2:17 PM

## 2022-03-23 NOTE — Plan of Care (Signed)
  Problem: Clinical Measurements: Goal: Respiratory complications will improve Outcome: Progressing Goal: Cardiovascular complication will be avoided Outcome: Progressing   Problem: Activity: Goal: Risk for activity intolerance will decrease Outcome: Progressing   Problem: Pain Managment: Goal: General experience of comfort will improve Outcome: Not Progressing   

## 2022-03-23 NOTE — Progress Notes (Signed)
Mobility Specialist Progress Note    03/23/22 1011  Mobility  Activity Ambulated independently in hallway  Activity Response Tolerated well  Distance Ambulated (ft) 420 ft  $Mobility charge 1 Mobility  Level of Assistance Independent  Assistive Device None  Mobility Referral No   Pre-Mobility: 104 HR During Mobility: 112 HR Post-Mobility: 107 HR  Pt received in doorway and agreeable. No complaints on walk. Returned to room with visitors present.   Hildred Alamin Mobility Specialist

## 2022-03-23 NOTE — Progress Notes (Signed)
Subjective:  Feels well. No chest pain  Objective:  Vital Signs in the last 24 hours: Temp:  [98.2 F (36.8 C)-98.9 F (37.2 C)] 98.2 F (36.8 C) (10/02 1148) Pulse Rate:  [92-100] 92 (10/02 1148) Resp:  [13-18] 18 (10/02 0433) BP: (106-119)/(69-85) 119/85 (10/02 1148) SpO2:  [94 %-96 %] 96 % (10/02 0727)  Intake/Output from previous day: 10/01 0701 - 10/02 0700 In: 256.2 [P.O.:240; I.V.:16.2] Out: 650 [Urine:650]  Physical Exam Vitals and nursing note reviewed.  Constitutional:      General: He is not in acute distress. Neck:     Vascular: No JVD.  Cardiovascular:     Rate and Rhythm: Normal rate and regular rhythm.     Heart sounds: Normal heart sounds. No murmur heard. Pulmonary:     Effort: Pulmonary effort is normal.     Breath sounds: Normal breath sounds. No wheezing or rales.  Musculoskeletal:     Right lower leg: No edema.     Left lower leg: No edema.      Imaging/tests reviewed and independently interpreted: CXR ordered   Cardiac Studies:  Telemetry 03/22/2022: Brief NSVT 3-4 beats  EKG 03/21/2022: Sinus rhythm Anteroseptal infarct, age undetermined  Coronary intervention 03/21/2022: LM: Normal LAD: Prox 100% occlusion          Mid 80-90% stenoses          Diffuse 80% diag disease Ramus: Large vessel. Prox 75% stenosis Lcx: Mid 95%, OM1 95% bifurcation stenosis RCA: Mid 80%, followed by 75% stenoses          Distal 80% stenosis   Successful percutaneous coronary intervention prox-mid LAD     Aspiration thrombectomy, PTCA, and overlapping stents placement     Synergy DES 4.0X12 mm, 3.5X48 mm, 3.0X16 mm     This was necessitated by flow limiting lesions beyond the culprit lesion     Post dilataation with 4.0X15 mm [place] balloon upto 18 atm      We will consider nonculprit PCI to remaining vessels during index hospitalization.     Echocardiogram 03/21/2022:  1. Left ventricular ejection fraction, by estimation, is 40 to 45%. The  left  ventricle has mildly decreased function. The left ventricle  demonstrates regional wall motion abnormalities (see scoring  diagram/findings for description). There is mild left  ventricular hypertrophy. Left ventricular diastolic parameters are  indeterminate. There is severe hypokinesis of the left ventricular,  mid-apical anteroseptal wall and anterior wall. There is akinesis of the  left ventricular, apical segment.   2. Right ventricular systolic function is normal. The right ventricular  size is normal.   3. The mitral valve is normal in structure. No evidence of mitral valve  regurgitation. No evidence of mitral stenosis.   4. The aortic valve is normal in structure. Aortic valve regurgitation is  not visualized. No aortic stenosis is present.     Assessment & Recommendations:  36 y.o. Caucasian male  with hypertension, hyperlipidemia, family h/o CAD with STEMI  STEMI: Culprit prox LAD with downstream severe lesions in mid LAD, also flow limiting S/p Aspiration thrombectomy, PTCA, and overlapping stents placement prox-mid LAD     Synergy DES 4.0X12 mm, 3.5X48 mm, 3.0X16 mm Residual severe stenoses in prox ramus, prox Lcx/OM1, prox, distal RCA.  Diffuse disease diagonal 1 best treated medically. EF 40-45%, severe hypokinesis/akinesis LAD territory. Continue DAPT with Aspirin, Brilinta. Continue Crestor 20 mg daily. Lipoprotein (a) only 60s. Continue losartan 12.5 mg daily. Will consider changing to [facility] later. Continue  metoprolol succinate 12. 5 mg daily, Jardiance 10 mg daily.  Lipid panel performed this morning shows surprisingly normal triglycerides and LDL of only 82.  I suspect this may be falsely lowered in the setting of recent MI.  I will repeat lipid panel and direct LDL again in 1 month time.  Unfortunate situation where he would have been best served by CABG had he presented with stable symptoms prior to STEMI. Prox-mid LAD PCI with necessitated by his STEMI  presentation with flow limiting lesions beyond the culprit in prox LAD. As a result, he may not have any remaining good target for LIMA-LAD. However, he still has several remaining non-culprit severe stenoses that warrant complete revascularization. I have requested CT surgery to see if he still could benefit from surgical revascularization to the remaining vessels. Caveat will be that interruption of DAPT will be very high risk due to overlapping stents in prox-mid LAD placed in the setting of STEMI. For any surgical revascularization, he may likely need bridging with cangrelor. If CABG not an option, will consider PCI to remaining non-culprit vessels, possibly in staged manner.   Appreciate Dr. Leonarda Salon input. However, LCx/OM deemed not to be a good surgical target. I remain concerned about Lcx/OM being an unstable lesion. I do think all the stated vessels are revascularizable by CABG or PCI, arterial grafts to ramus and RCA would likely have best long term outcomes but leaves Lcx/OM unrevascularized.   Patient wants to think about his options.        HFrEF: Euvolemic. EF 40-45%. Meds as above.   With EF >35%, unlikely to be approved for Life Vest.  Mixed hyperlipidemia: See above. Could consider enrolling in EVOLVE-MI trial (Early Repatha vs standard of care).     Nigel Mormon, MD Pager: [phone] Office: [phone]

## 2022-03-24 ENCOUNTER — Other Ambulatory Visit (HOSPITAL_COMMUNITY): Payer: Self-pay

## 2022-03-24 ENCOUNTER — Telehealth (HOSPITAL_COMMUNITY): Payer: Self-pay

## 2022-03-24 DIAGNOSIS — I2101 ST elevation (STEMI) myocardial infarction involving left main coronary artery: Secondary | ICD-10-CM | POA: Diagnosis not present

## 2022-03-24 DIAGNOSIS — I2510 Atherosclerotic heart disease of native coronary artery without angina pectoris: Secondary | ICD-10-CM | POA: Diagnosis not present

## 2022-03-24 LAB — CBC
HCT: 42.5 % (ref 39.0–52.0)
Hemoglobin: 15.5 g/dL (ref 13.0–17.0)
MCH: 31 pg (ref 26.0–34.0)
MCHC: 36.5 g/dL — ABNORMAL HIGH (ref 30.0–36.0)
MCV: 85 fL (ref 80.0–100.0)
Platelets: 270 10*3/uL (ref 150–400)
RBC: 5 MIL/uL (ref 4.22–5.81)
RDW: 11.8 % (ref 11.5–15.5)
WBC: 13.2 10*3/uL — ABNORMAL HIGH (ref 4.0–10.5)
nRBC: 0 % (ref 0.0–0.2)

## 2022-03-24 LAB — BASIC METABOLIC PANEL
Anion gap: 11 (ref 5–15)
BUN: 14 mg/dL (ref 6–20)
CO2: 23 mmol/L (ref 22–32)
Calcium: 9.1 mg/dL (ref 8.9–10.3)
Chloride: 99 mmol/L (ref 98–111)
Creatinine, Ser: 1.07 mg/dL (ref 0.61–1.24)
GFR, Estimated: >60 mL/min (ref >60)
Glucose, Bld: 90 mg/dL (ref 70–99)
Potassium: 3.8 mmol/L (ref 3.5–5.1)
Sodium: 133 mmol/L — ABNORMAL LOW (ref 135–145)

## 2022-03-24 MED ORDER — LOSARTAN POTASSIUM 25 MG PO TABS
25.0000 mg | ORAL_TABLET | Freq: Every day | ORAL | Status: DC
  Administered 2022-03-24 – 2022-03-26 (×3): 25 mg via ORAL
  Filled 2022-03-24 (×3): qty 1

## 2022-03-24 MED ORDER — METOPROLOL SUCCINATE ER 25 MG PO TB24
25.0000 mg | ORAL_TABLET | Freq: Every day | ORAL | Status: DC
  Administered 2022-03-24 – 2022-03-26 (×3): 25 mg via ORAL
  Filled 2022-03-24 (×3): qty 1

## 2022-03-24 MED ORDER — COLCHICINE 0.6 MG PO TABS
0.6000 mg | ORAL_TABLET | Freq: Two times a day (BID) | ORAL | Status: DC
  Administered 2022-03-24 – 2022-03-30 (×12): 0.6 mg via ORAL
  Filled 2022-03-24 (×12): qty 1

## 2022-03-24 MED ORDER — DIPHENHYDRAMINE HCL 25 MG PO CAPS
25.0000 mg | ORAL_CAPSULE | Freq: Three times a day (TID) | ORAL | Status: DC | PRN
  Administered 2022-03-24 – 2022-03-26 (×5): 25 mg via ORAL
  Filled 2022-03-24 (×5): qty 1

## 2022-03-24 NOTE — Progress Notes (Signed)
Still complaining of gout pain on  left big toe and ankle. Claimed to be getting worst. No  redness noted   MD made aware with order.

## 2022-03-24 NOTE — Progress Notes (Signed)
Subjective:  No chest pain Had gout pain in left big toe, now improving  Objective:  Vital Signs in the last 24 hours: Temp:  [98.2 F (36.8 C)-99.2 F (37.3 C)] 98.3 F (36.8 C) (10/03 0757) Pulse Rate:  [89-103] 99 (10/03 0424) Resp:  [18] 18 (10/03 0744) BP: (110-137)/(63-85) 130/63 (10/03 0757) SpO2:  [94 %-98 %] 98 % (10/03 0744)  Intake/Output from previous day: 10/02 0701 - 10/03 0700 In: 608 [P.O.:440; I.V.:168] Out: 1000 [Urine:1000]  Physical Exam Vitals and nursing note reviewed.  Constitutional:      General: He is not in acute distress. Neck:     Vascular: No JVD.  Cardiovascular:     Rate and Rhythm: Normal rate and regular rhythm.     Heart sounds: Normal heart sounds. No murmur heard. Pulmonary:     Effort: Pulmonary effort is normal.     Breath sounds: Normal breath sounds. No wheezing or rales.  Musculoskeletal:     Right lower leg: No edema.     Left lower leg: No edema.      Imaging/tests reviewed and independently interpreted: CXR ordered   Cardiac Studies:  Telemetry 03/22/2022: Brief NSVT 3-4 beats  EKG 03/21/2022: Sinus rhythm Anteroseptal infarct, age undetermined  Coronary intervention 03/21/2022: LM: Normal LAD: Prox 100% occlusion          Mid 80-90% stenoses          Diffuse 80% diag disease Ramus: Large vessel. Prox 75% stenosis Lcx: Mid 95%, OM1 95% bifurcation stenosis RCA: Mid 80%, followed by 75% stenoses          Distal 80% stenosis   Successful percutaneous coronary intervention prox-mid LAD     Aspiration thrombectomy, PTCA, and overlapping stents placement     Synergy DES 4.0X12 mm, 3.5X48 mm, 3.0X16 mm     This was necessitated by flow limiting lesions beyond the culprit lesion     Post dilataation with 4.0X15 mm [place] balloon upto 18 atm      We will consider nonculprit PCI to remaining vessels during index hospitalization.     Echocardiogram 03/21/2022:  1. Left ventricular ejection fraction, by estimation, is  40 to 45%. The  left ventricle has mildly decreased function. The left ventricle  demonstrates regional wall motion abnormalities (see scoring  diagram/findings for description). There is mild left  ventricular hypertrophy. Left ventricular diastolic parameters are  indeterminate. There is severe hypokinesis of the left ventricular,  mid-apical anteroseptal wall and anterior wall. There is akinesis of the  left ventricular, apical segment.   2. Right ventricular systolic function is normal. The right ventricular  size is normal.   3. The mitral valve is normal in structure. No evidence of mitral valve  regurgitation. No evidence of mitral stenosis.   4. The aortic valve is normal in structure. Aortic valve regurgitation is  not visualized. No aortic stenosis is present.     Assessment & Recommendations:  36 y.o. Caucasian male  with hypertension, hyperlipidemia, family h/o CAD with STEMI  STEMI: Culprit prox LAD with downstream severe lesions in mid LAD, also flow limiting S/p Aspiration thrombectomy, PTCA, and overlapping stents placement prox-mid LAD     Synergy DES 4.0X12 mm, 3.5X48 mm, 3.0X16 mm Residual severe stenoses in prox ramus, prox Lcx/OM1, prox, distal RCA.  Diffuse disease diagonal 1 best treated medically. EF 40-45%, severe hypokinesis/akinesis LAD territory. Continue DAPT with Aspirin, Brilinta. Continue Crestor 20 mg daily. Lipoprotein (a) only 60s. Increase losartan to 25 mg  daily, metoprolol succinate to 25 mg daily. Continue Jardiance 10 mg daily.  Lipid panel performed this morning shows surprisingly normal triglycerides and LDL of only 82.  I suspect this may be falsely lowered in the setting of recent MI.  I will repeat lipid panel and direct LDL again in 1 month time.  Unfortunate situation where he would have been best served by CABG had he presented with stable symptoms prior to STEMI. Prox-mid LAD PCI with necessitated by his STEMI presentation with flow  limiting lesions beyond the culprit in prox LAD. As a result, he may not have any remaining good target for LIMA-LAD. However, he still has several remaining non-culprit severe stenoses that warrant complete revascularization. I have requested CT surgery to see if he still could benefit from surgical revascularization to the remaining vessels. Caveat will be that interruption of DAPT will be very high risk due to overlapping stents in prox-mid LAD placed in the setting of STEMI. For any surgical revascularization, he may likely need bridging with cangrelor. If CABG not an option, will consider PCI to remaining non-culprit vessels, possibly in staged manner.   Appreciate Dr. Leonarda Salon input. After discussion with Dr. Roxan Hockey, my interventional colleagues, we felt that arterial grafts was his best option for longevity. Stopped brilinta, switched to IV cangrelor.  HFrEF: Euvolemic. EF 40-45%. Meds as above.   With EF >35%, unlikely to be approved for Life Vest.  Mixed hyperlipidemia: See above. Could consider enrolling in EVOLVE-MI trial (Early Repatha vs standard of care).  Gout: Improved on allopurinol.     Nigel Mormon, MD Pager: [phone] Office: [phone]

## 2022-03-24 NOTE — Progress Notes (Signed)
Pt complaining that his gout in his foot is bothering him. Pt is requesting Allopurinol. Paged MD for order. Awaiting order from MD.

## 2022-03-24 NOTE — Progress Notes (Signed)
CARDIAC REHAB PHASE I     Pt pre-op OHS education including OHS booklet,move in the tub, OHS handout, sternal precautions, home needs at discharge, IS use and mobility after surgery reviewed with pt. Able to reach 1750 with IS today. All questions and concerns addressed. Will continue to follow.   [id number]    Vanessa Barbara, RN BSN 03/24/2022 12:15 PM

## 2022-03-24 NOTE — TOC Benefit Eligibility Note (Signed)
Patient Teacher, English as a foreign language completed.    The current 30 day co-pay is, $34.99 for Jardiance 10mg .  Entresto 24-26mg  is a non-formulary product and to use alternate options of ACE inhibitors.   Captopril 12.5mg  copay $9.14.  Wilder Glade 10mg  is a non-formulary product and to use Jardiance.   The patient is insured through [employer], [place] Patient Advocate Specialist [place] Patient Advocate Team Direct Number: [phone]  Fax: [phone]

## 2022-03-24 NOTE — Progress Notes (Signed)
Pt is still complaining of gout pain in his foot. Pt states the Allopurinol he requested earlier did not help him. Paged MD, awaiting a new order.

## 2022-03-24 NOTE — Progress Notes (Signed)
3 Days Post-Op Procedure(s) (LRB): Coronary/Graft Acute MI Revascularization (N/A) Subjective: No chest pain  Objective: Vital signs in last 24 hours: Temp:  [98.1 F (36.7 C)-99.1 F (37.3 C)] 98.1 F (36.7 C) (10/03 1616) Pulse Rate:  [88-103] 94 (10/03 1616) Cardiac Rhythm: Normal sinus rhythm (10/03 1200) Resp:  [18] 18 (10/03 1200) BP: (110-137)/(63-83) 113/75 (10/03 1616) SpO2:  [96 %-98 %] 98 % (10/03 1600)  Hemodynamic parameters for last 24 hours:    Intake/Output from previous day: 10/02 0701 - 10/03 0700 In: 608 [P.O.:440; I.V.:168] Out: 1000 [Urine:1000] Intake/Output this shift: Total I/O In: 229.5 [I.V.:229.5] Out: -   General appearance: alert, cooperative, and no distress Neurologic: intact Heart: regular rate and rhythm  Lab Results: Recent Labs    03/23/22 0015 03/24/22 0017  WBC 13.6* 13.2*  HGB 15.3 15.5  HCT 42.3 42.5  PLT 278 270   BMET:  Recent Labs    03/23/22 0015 03/24/22 0017  NA 135 133*  K 4.0 3.8  CL 99 99  CO2 24 23  GLUCOSE 104* 90  BUN 10 14  CREATININE 1.15 1.07  CALCIUM 9.2 9.1    PT/INR: No results for input(s): "LABPROT", "INR" in the last 72 hours. ABG    Component Value Date/Time   TCO2 21 (L) 03/21/2022 0128   CBG (last 3)  No results for input(s): "GLUCAP" in the last 72 hours.  Assessment/Plan: S/P Procedure(s) (LRB): Coronary/Graft Acute MI Revascularization (N/A)   Three-vessel coronary disease status post anterior ST elevation MI treated with emergent angioplasty and stenting of LAD.  Residual two-vessel disease involving ramus intermedius, circumflex, and RCA.  Needs complete revascularization.  In my opinion and the consensus of the heart team was that bypass surgery with be more likely to provide a long-term durable revascularization than additional stenting.  I discussed this with the patient and he is agreeable to proceeding with coronary bypass grafting..  Plan OR Friday.  We will plan to do  all arterial grafting.   LOS: 3 days    Melrose Nakayama 03/24/2022

## 2022-03-24 NOTE — Progress Notes (Signed)
Complained of itchiness on his thighs bil. Claiming that the linen makes him itchy. MD made aware with order.  Offered to change linens  but refused.Continue to monitor.

## 2022-03-24 NOTE — Progress Notes (Signed)
03/24/22 0910  Clinical Encounter Type  Visited With Patient and family together  Visit Type Follow-up;Pre-op  Referral From Physician;Nurse Nigel Mormon, MD; Collene Mares, RN)  Consult/Referral To Chaplain Melvenia Beam)   Chaplain responded to Spiritual Care Consultation - "Patient requests prayer - Patient going for open heart surgery." Chaplain met with [name]. Michael Hart and his friend - "Michael Hart" at patient's bedside. In conversation Michael Hart alluded that the visit was for explanation of [place] and Living Will and an inopportune time for prayer. During our consultation Dr. Roxan Hockey entered the room to discuss forthcoming surgery, which may have been a distraction from our conversation. For this reason chaplain directed conversation toward Advance Care Directive and offered opportunity for Spiritual care services for both Michael Hart and his friend at a later time if either desired.    Michael Hart Stated that he does NOT have a court appointed [place].  Michael Hart stated that he is NOT Married.  Michael Hart stated that at the present time he desires that his mother:    Michael Hart [phone], to serve as his Michael Hart.    Chaplain provided the Advance Directive packet as well as education on Advance Directives-documents an individual completes to communicate their health care directions in advance of a time when they may need them. Chaplain informed Michael Hart the documents which may be completed here in the hospital are the Living Will and [place].   Chaplain informed patient that the [place] is a legal document in which an individual names another person, as their [place], to communicate his health care decisions, when he, himself is not able to make them for himself. The Health Care Agent's function can be temporary or  permanent depending on his ability to make and communicate those decisions independently.   Chaplain informed Michael Hart that in the absence of a [place], the state of [place] directs health care providers to look to the following individuals in the order listed: legal guardian; an attorney-in-fact under a general power of attorney (POA) if that POA includes the right to make health care decisions; person's spouse; a 81 of his children; a 43 of adult brothers and sisters; or an individual who has an established relationship with you, who is acting in good faith and who can convey your wishes.  If none of these persons are available or willing to make medical decisions on a patient's behalf, the law allows the patient's doctor to make decisions for them as long as another doctor agrees with those decisions.  Chaplain also informed the patient that the Health Care agent has no decision-making authority over any affairs other than those related to his medical care.   The chaplain further educated Michael Hart that a Living Will is a legal document that allows his desires not to receive life-prolonging measures in the event that they have a condition that is incurable and will result in his death in a short period of time; they are unconscious, and doctors are confident that they will not regain consciousness; and/or they have advanced dementia or other substantial and irreversible loss of mental function.   The chaplain informed Michael Hart that life-prolonging measures are medical treatments that would only serve to postpone death, including breathing machines, kidney dialysis, antibiotics, artificial nutrition and hydration (tube feeding), and  similar forms of treatment and that if an individual is able to express their wishes, they may also make them known without the use of a Living Will, but in the event that he is not able to express his wishes, a Living Will allows  medical providers and his family and friends to ensure that they are not making decisions on his behalf, but rather serving as his voice to convey the decisions that he has already made.   Michael Hart is aware that the decision to create an advance directive is his alone and he may choose not to complete the documents or may choose to complete one portion or both.  Michael Hart was informed that he can revoke the documents at any time by striking through them and writing void or by completing new documents, but that it is also advisable that the individual verbally notify interested parties that his wishes have changed.   Michael Hart is also aware that the document must be signed in the presence of a notary public and two witnesses and that this can be done while the patient is still admitted to the hospital or after discharge in the community. If they decide to complete Advance Directives after being discharged from the hospital, they have been advised to notify all interested parties and to provide those documents to their physicians and loved ones in addition to bringing them to the hospital in the event of another hospitalization.   The chaplain informed the Michael Hart that if he desires to proceed with completing Advance Directive Documentation while he is still admitted, notary services are sometimes available at [facility] between the hours of 1:00 and 3:30 Monday-Thursday. Michael Hart did not have any additional questions and stated that he, and his son would complete the documents.   When the patient is ready to have these documents completed, the patient should request that their nurse place a spiritual care consult and indicate that the patient is ready to have their advance directives notarized so that arrangements for witnesses and notary public can be made.   Please page spiritual care if the patient desires further education or has questions.   [address] [place], [place]., [phone].

## 2022-03-24 NOTE — Telephone Encounter (Signed)
Pharmacy Patient Advocate Encounter  Insurance verification completed.    The patient is insured through [employer]   The patient is currently admitted and ran test claims for the following: Entresto 24-26mg , captopril 12.5mg , Farxiga 10mg  and Jardiance 10mg .  Copays and coinsurance results were relayed to Inpatient clinical team.

## 2022-03-25 ENCOUNTER — Inpatient Hospital Stay (HOSPITAL_COMMUNITY): Payer: 59

## 2022-03-25 DIAGNOSIS — Z01818 Encounter for other preprocedural examination: Secondary | ICD-10-CM

## 2022-03-25 LAB — BASIC METABOLIC PANEL
Anion gap: 11 (ref 5–15)
BUN: 16 mg/dL (ref 6–20)
CO2: 23 mmol/L (ref 22–32)
Calcium: 8.9 mg/dL (ref 8.9–10.3)
Chloride: 96 mmol/L — ABNORMAL LOW (ref 98–111)
Creatinine, Ser: 1.13 mg/dL (ref 0.61–1.24)
GFR, Estimated: >60 mL/min (ref >60)
Glucose, Bld: 103 mg/dL — ABNORMAL HIGH (ref 70–99)
Potassium: 3.7 mmol/L (ref 3.5–5.1)
Sodium: 130 mmol/L — ABNORMAL LOW (ref 135–145)

## 2022-03-25 LAB — CBC
HCT: 42.3 % (ref 39.0–52.0)
Hemoglobin: 14.9 g/dL (ref 13.0–17.0)
MCH: 30.7 pg (ref 26.0–34.0)
MCHC: 35.2 g/dL (ref 30.0–36.0)
MCV: 87.2 fL (ref 80.0–100.0)
Platelets: 309 10*3/uL (ref 150–400)
RBC: 4.85 MIL/uL (ref 4.22–5.81)
RDW: 11.7 % (ref 11.5–15.5)
WBC: 13.1 10*3/uL — ABNORMAL HIGH (ref 4.0–10.5)
nRBC: 0 % (ref 0.0–0.2)

## 2022-03-25 NOTE — Plan of Care (Signed)
  Problem: Clinical Measurements: Goal: Respiratory complications will improve Outcome: Progressing Goal: Cardiovascular complication will be avoided Outcome: Progressing   Problem: Activity: Goal: Risk for activity intolerance will decrease Outcome: Progressing   Problem: Coping: Goal: Level of anxiety will decrease Outcome: Progressing   

## 2022-03-25 NOTE — Progress Notes (Signed)
CARDIAC REHAB PHASE I   PRE:  Rate/Rhythm: 100 sT    BP: sitting 104/79    SaO2: 95 RA  MODE:  Ambulation: 100 ft   POST:  Rate/Rhythm: 125 ST    BP: sitting 105/75     SaO2: 96 RA  Pt sts gout pain is improving but still struggled with pain in bilateral great toes and ankles. Used RW and initially dizzy. Sat, rested, walked to BR, then able to walk in hall with RW. Pain slightly better with distance, HR elevated. Denied CP. No questions regarding surgery. [phone]   Yves Dill BS, ACSM-CEP 03/25/2022 11:01 AM

## 2022-03-25 NOTE — Progress Notes (Signed)
Pre-CABG Dopplers completed. Refer to "CV Proc" under chart review to view preliminary results.  03/25/2022 11:52 AM Kelby Aline., MHA, RVT, RDCS, RDMS

## 2022-03-25 NOTE — Progress Notes (Signed)
Mobility Specialist Progress Note    03/25/22 1310  Mobility  Activity Ambulated with assistance to bathroom  Activity Response Tolerated well  Distance Ambulated (ft) 10 ft  $Mobility charge 1 Mobility  Level of Assistance Standby assist, set-up cues, supervision of patient - no hands on  Assistive Device Front wheel walker   Pt received in bed and requesting to attempt BM. Declining further ambulation d/t gout pain. Returned to bed with call bell in reach.   Hildred Alamin Mobility Specialist

## 2022-03-25 NOTE — Progress Notes (Signed)
Subjective:  No chest pain Still has gout pain, improving. Says heart rate is elevated.   Objective:  Vital Signs in the last 24 hours: Temp:  [98.1 F (36.7 C)-99.7 F (37.6 C)] 99.7 F (37.6 C) (10/04 0839) Pulse Rate:  [88-105] 105 (10/04 0839) Resp:  [18] 18 (10/04 0839) BP: (99-128)/(57-91) 128/91 (10/04 0839) SpO2:  [95 %-98 %] 98 % (10/04 0839)  Intake/Output from previous day: 10/03 0701 - 10/04 0700 In: 1749 [P.O.:1210; I.V.:539] Out: 1100 [Urine:1100]  Physical Exam Vitals and nursing note reviewed.  Constitutional:      General: He is not in acute distress. Neck:     Vascular: No JVD.  Cardiovascular:     Rate and Rhythm: Normal rate and regular rhythm.     Heart sounds: Normal heart sounds. No murmur heard. Pulmonary:     Effort: Pulmonary effort is normal.     Breath sounds: Normal breath sounds. No wheezing or rales.  Musculoskeletal:     Right lower leg: No edema.     Left lower leg: No edema.      Imaging/tests reviewed and independently interpreted: CXR ordered   Cardiac Studies:  Telemetry 03/22/2022: Brief NSVT 3-4 beats  EKG 03/21/2022: Sinus rhythm Anteroseptal infarct, age undetermined  Coronary intervention 03/21/2022: LM: Normal LAD: Prox 100% occlusion          Mid 80-90% stenoses          Diffuse 80% diag disease Ramus: Large vessel. Prox 75% stenosis Lcx: Mid 95%, OM1 95% bifurcation stenosis RCA: Mid 80%, followed by 75% stenoses          Distal 80% stenosis   Successful percutaneous coronary intervention prox-mid LAD     Aspiration thrombectomy, PTCA, and overlapping stents placement     Synergy DES 4.0X12 mm, 3.5X48 mm, 3.0X16 mm     This was necessitated by flow limiting lesions beyond the culprit lesion     Post dilataation with 4.0X15 mm Central balloon upto 18 atm      We will consider nonculprit PCI to remaining vessels during index hospitalization.     Echocardiogram 03/21/2022:  1. Left ventricular ejection fraction,  by estimation, is 40 to 45%. The  left ventricle has mildly decreased function. The left ventricle  demonstrates regional wall motion abnormalities (see scoring  diagram/findings for description). There is mild left  ventricular hypertrophy. Left ventricular diastolic parameters are  indeterminate. There is severe hypokinesis of the left ventricular,  mid-apical anteroseptal wall and anterior wall. There is akinesis of the  left ventricular, apical segment.   2. Right ventricular systolic function is normal. The right ventricular  size is normal.   3. The mitral valve is normal in structure. No evidence of mitral valve  regurgitation. No evidence of mitral stenosis.   4. The aortic valve is normal in structure. Aortic valve regurgitation is  not visualized. No aortic stenosis is present.     Assessment & Recommendations:  36 y.o. Caucasian male  with hypertension, hyperlipidemia, family h/o CAD with STEMI  STEMI: Culprit prox LAD with downstream severe lesions in mid LAD, also flow limiting S/p Aspiration thrombectomy, PTCA, and overlapping stents placement prox-mid LAD     Synergy DES 4.0X12 mm, 3.5X48 mm, 3.0X16 mm Residual severe stenoses in prox ramus, prox Lcx/OM1, prox, distal RCA.  Diffuse disease diagonal 1 best treated medically. EF 40-45%, severe hypokinesis/akinesis LAD territory. Continue Aspirin, cangrelor. Continue Crestor 20 mg daily. Lipoprotein (a) only 60s. Continue losartan to 25 mg  daily, metoprolol succinate to 25 mg daily. Continue Jardiance 10 mg daily.  Lipid panel performed this morning shows surprisingly normal triglycerides and LDL of only 82.  I suspect this may be falsely lowered in the setting of recent MI.  I will repeat lipid panel and direct LDL again in 1 month time.  Appreciate Dr. Leonarda Salon input. After discussion with Dr. Roxan Hockey, my interventional colleagues, we felt that arterial grafts for residual severe non-culprit stenoses was his  best option for longevity. Stopped brilinta, switched to IV cangrelor.  Awaiting CABG, scheduled for Friday 10/6.  HFrEF: Euvolemic. EF 40-45%. Meds as above.   With EF >35%, unlikely to be approved for Life Vest.  Mixed hyperlipidemia: See above. Could consider enrolling in EVOLVE-MI trial (Early Repatha vs standard of care).  Gout: Continue allupurinol and colchicine. Would avoid NSAID or steroids going into CABG.     Nigel Mormon, MD Pager: [phone] Office: [phone]

## 2022-03-25 NOTE — TOC Progression Note (Signed)
Transition of Care [facility]) - Progression Note    Patient Details  Name: Michael Hart MRN: [id number] Date of Birth: 1986-05-02  Transition of Care [facility]) CM/SW [place], RN Phone Number:[phone]  03/25/2022, 10:26 AM  Clinical Narrative:     Transition of Care [facility]) Screening Note   Patient Details  Name: Michael Hart Date of Birth: 11/04/1985   Transition of Care [facility]) CM/SW Contact:    Angelita Ingles, RN Phone Number: 03/25/2022, 10:26 AM    Transition of Care Department [facility]) has reviewed patient and no TOC needs have been identified at this time. We will continue to monitor patient advancement through interdisciplinary progression rounds.           Expected Discharge Plan and Services                                                 Social Determinants of Health (SDOH) Interventions    Readmission Risk Interventions     No data to display

## 2022-03-26 DIAGNOSIS — I2510 Atherosclerotic heart disease of native coronary artery without angina pectoris: Secondary | ICD-10-CM

## 2022-03-26 LAB — URINALYSIS, ROUTINE W REFLEX MICROSCOPIC
Bacteria, UA: NONE SEEN
Bilirubin Urine: NEGATIVE
Glucose, UA: >=500 mg/dL — AB
Hgb urine dipstick: NEGATIVE
Ketones, ur: NEGATIVE mg/dL
Leukocytes,Ua: NEGATIVE
Nitrite: NEGATIVE
Protein, ur: NEGATIVE mg/dL
Specific Gravity, Urine: 1.003 — ABNORMAL LOW (ref 1.005–1.030)
pH: 5 (ref 5.0–8.0)

## 2022-03-26 LAB — BASIC METABOLIC PANEL
Anion gap: 11 (ref 5–15)
BUN: 19 mg/dL (ref 6–20)
CO2: 21 mmol/L — ABNORMAL LOW (ref 22–32)
Calcium: 8.6 mg/dL — ABNORMAL LOW (ref 8.9–10.3)
Chloride: 100 mmol/L (ref 98–111)
Creatinine, Ser: 1.1 mg/dL (ref 0.61–1.24)
GFR, Estimated: >60 mL/min (ref >60)
Glucose, Bld: 93 mg/dL (ref 70–99)
Potassium: 3.8 mmol/L (ref 3.5–5.1)
Sodium: 132 mmol/L — ABNORMAL LOW (ref 135–145)

## 2022-03-26 LAB — BLOOD GAS, ARTERIAL
Acid-base deficit: 1.5 mmol/L (ref 0.0–2.0)
Bicarbonate: 21.1 mmol/L (ref 20.0–28.0)
O2 Saturation: 99.8 %
Patient temperature: 37
pCO2 arterial: 29 mmHg — ABNORMAL LOW (ref 32–48)
pH, Arterial: 7.47 — ABNORMAL HIGH (ref 7.35–7.45)
pO2, Arterial: 113 mmHg — ABNORMAL HIGH (ref 83–108)

## 2022-03-26 LAB — CBC
HCT: 39.1 % (ref 39.0–52.0)
Hemoglobin: 14.4 g/dL (ref 13.0–17.0)
MCH: 31.2 pg (ref 26.0–34.0)
MCHC: 36.8 g/dL — ABNORMAL HIGH (ref 30.0–36.0)
MCV: 84.8 fL (ref 80.0–100.0)
Platelets: 312 10*3/uL (ref 150–400)
RBC: 4.61 MIL/uL (ref 4.22–5.81)
RDW: 11.6 % (ref 11.5–15.5)
WBC: 11.9 10*3/uL — ABNORMAL HIGH (ref 4.0–10.5)
nRBC: 0 % (ref 0.0–0.2)

## 2022-03-26 LAB — SURGICAL PCR SCREEN
MRSA, PCR: NEGATIVE
Staphylococcus aureus: POSITIVE — AB

## 2022-03-26 LAB — ABO/RH: ABO/RH(D): A POS

## 2022-03-26 MED ORDER — METOPROLOL TARTRATE 12.5 MG HALF TABLET
12.5000 mg | ORAL_TABLET | Freq: Once | ORAL | Status: AC
  Administered 2022-03-27: 12.5 mg via ORAL
  Filled 2022-03-26: qty 1

## 2022-03-26 MED ORDER — VANCOMYCIN HCL 1250 MG/250ML IV SOLN
1250.0000 mg | INTRAVENOUS | Status: AC
  Administered 2022-03-27: 1250 mg via INTRAVENOUS
  Filled 2022-03-26: qty 250

## 2022-03-26 MED ORDER — BISACODYL 5 MG PO TBEC
5.0000 mg | DELAYED_RELEASE_TABLET | Freq: Once | ORAL | Status: AC
  Administered 2022-03-26: 5 mg via ORAL
  Filled 2022-03-26: qty 1

## 2022-03-26 MED ORDER — DEXMEDETOMIDINE HCL IN NACL 400 MCG/100ML IV SOLN
0.1000 ug/kg/h | INTRAVENOUS | Status: DC
  Administered 2022-03-27: .7 ug/kg/h via INTRAVENOUS
  Filled 2022-03-26: qty 100

## 2022-03-26 MED ORDER — TRANEXAMIC ACID (OHS) PUMP PRIME SOLUTION
2.0000 mg/kg | INTRAVENOUS | Status: DC
  Filled 2022-03-26: qty 2.27

## 2022-03-26 MED ORDER — DIAZEPAM 5 MG PO TABS
5.0000 mg | ORAL_TABLET | Freq: Once | ORAL | Status: AC
  Administered 2022-03-26: 5 mg via ORAL
  Filled 2022-03-26: qty 1

## 2022-03-26 MED ORDER — HEPARIN 30,000 UNITS/1000 ML (OHS) CELLSAVER SOLUTION
Status: DC
  Filled 2022-03-26: qty 1000

## 2022-03-26 MED ORDER — MUPIROCIN 2 % EX OINT
1.0000 | TOPICAL_OINTMENT | Freq: Two times a day (BID) | CUTANEOUS | Status: AC
  Administered 2022-03-26 – 2022-03-30 (×7): 1 via NASAL
  Filled 2022-03-26 (×4): qty 22

## 2022-03-26 MED ORDER — MILRINONE LACTATE IN DEXTROSE 20-5 MG/100ML-% IV SOLN
0.3000 ug/kg/min | INTRAVENOUS | Status: DC
  Filled 2022-03-26: qty 100

## 2022-03-26 MED ORDER — LEVOFLOXACIN IN D5W 500 MG/100ML IV SOLN
500.0000 mg | INTRAVENOUS | Status: AC
  Administered 2022-03-27: 500 mg via INTRAVENOUS
  Filled 2022-03-26: qty 100

## 2022-03-26 MED ORDER — CHLORHEXIDINE GLUCONATE 0.12 % MT SOLN
15.0000 mL | Freq: Once | OROMUCOSAL | Status: AC
  Administered 2022-03-27: 15 mL via OROMUCOSAL
  Filled 2022-03-26: qty 15

## 2022-03-26 MED ORDER — EPINEPHRINE HCL 5 MG/250ML IV SOLN IN NS
0.0000 ug/min | INTRAVENOUS | Status: DC
  Filled 2022-03-26: qty 250

## 2022-03-26 MED ORDER — CHLORHEXIDINE GLUCONATE CLOTH 2 % EX PADS
6.0000 | MEDICATED_PAD | Freq: Every day | CUTANEOUS | Status: DC
  Administered 2022-03-27: 6 via TOPICAL

## 2022-03-26 MED ORDER — INSULIN REGULAR(HUMAN) IN NACL 100-0.9 UT/100ML-% IV SOLN
INTRAVENOUS | Status: AC
  Administered 2022-03-27: 1.1 [IU]/h via INTRAVENOUS
  Filled 2022-03-26: qty 100

## 2022-03-26 MED ORDER — TRANEXAMIC ACID (OHS) BOLUS VIA INFUSION
15.0000 mg/kg | INTRAVENOUS | Status: AC
  Administered 2022-03-27: 1699.5 mg via INTRAVENOUS
  Filled 2022-03-26: qty 1700

## 2022-03-26 MED ORDER — NOREPINEPHRINE 4 MG/250ML-% IV SOLN
0.0000 ug/min | INTRAVENOUS | Status: DC
  Filled 2022-03-26: qty 250

## 2022-03-26 MED ORDER — CHLORHEXIDINE GLUCONATE CLOTH 2 % EX PADS
6.0000 | MEDICATED_PAD | Freq: Once | CUTANEOUS | Status: AC
  Administered 2022-03-26: 6 via TOPICAL

## 2022-03-26 MED ORDER — SODIUM CHLORIDE 0.9 % IV SOLN
0.7500 ug/kg/min | INTRAVENOUS | Status: DC
  Administered 2022-03-26 – 2022-03-27 (×2): 0.75 ug/kg/min via INTRAVENOUS
  Filled 2022-03-26 (×2): qty 50

## 2022-03-26 MED ORDER — CHLORHEXIDINE GLUCONATE CLOTH 2 % EX PADS: 6.0000 | MEDICATED_PAD | Freq: Once | CUTANEOUS | Status: AC

## 2022-03-26 MED ORDER — TRANEXAMIC ACID 1000 MG/10ML IV SOLN
1.5000 mg/kg/h | INTRAVENOUS | Status: AC
  Administered 2022-03-27: 1.5 mg/kg/h via INTRAVENOUS
  Filled 2022-03-26: qty 25

## 2022-03-26 MED ORDER — PHENYLEPHRINE HCL-NACL 20-0.9 MG/250ML-% IV SOLN
30.0000 ug/min | INTRAVENOUS | Status: AC
  Administered 2022-03-27: 30 ug/min via INTRAVENOUS
  Filled 2022-03-26: qty 250

## 2022-03-26 MED ORDER — POTASSIUM CHLORIDE 2 MEQ/ML IV SOLN
80.0000 meq | INTRAVENOUS | Status: DC
  Filled 2022-03-26: qty 40

## 2022-03-26 MED ORDER — MAGNESIUM SULFATE 50 % IJ SOLN
40.0000 meq | INTRAMUSCULAR | Status: DC
  Filled 2022-03-26: qty 9.85

## 2022-03-26 MED ORDER — FUROSEMIDE 10 MG/ML IJ SOLN
40.0000 mg | Freq: Once | INTRAMUSCULAR | Status: AC
  Administered 2022-03-26: 40 mg via INTRAVENOUS
  Filled 2022-03-26: qty 4

## 2022-03-26 MED ORDER — PLASMA-LYTE A IV SOLN
INTRAVENOUS | Status: DC
  Filled 2022-03-26: qty 2.5

## 2022-03-26 NOTE — Progress Notes (Signed)
Pt is anxious. MD informed, and instructed RN to give Valium 5am med now for pt sxs.

## 2022-03-26 NOTE — Progress Notes (Signed)
Subjective:  No chest pain Gout pain, improving Rash on hands and hips, now resolved  Objective:  Vital Signs in the last 24 hours: Temp:  [98.3 F (36.8 C)-99.6 F (37.6 C)] 98.3 F (36.8 C) (10/05 1234) Pulse Rate:  [92-96] 96 (10/05 1234) Resp:  [16-18] 16 (10/05 1201) BP: (100-120)/(62-80) 100/62 (10/05 1234) SpO2:  [98 %] 98 % (10/05 1201)  Intake/Output from previous day: 10/04 0701 - 10/05 0700 In: 1077.2 [P.O.:480; I.V.:597.2] Out: 2100 [Urine:2100]  Physical Exam Vitals and nursing note reviewed.  Constitutional:      General: He is not in acute distress. Neck:     Vascular: No JVD.  Cardiovascular:     Rate and Rhythm: Normal rate and regular rhythm.     Heart sounds: Normal heart sounds. No murmur heard. Pulmonary:     Effort: Pulmonary effort is normal.     Breath sounds: Normal breath sounds. No wheezing or rales.  Musculoskeletal:     Right lower leg: No edema.     Left lower leg: No edema.      Imaging/tests reviewed and independently interpreted: CXR 03/22/2022: No active disease.  Cardiac Studies:  Telemetry 03/22/2022: Brief NSVT 3-4 beats  EKG 03/21/2022: Sinus rhythm Anteroseptal infarct, age undetermined  Coronary intervention 03/21/2022: LM: Normal LAD: Prox 100% occlusion          Mid 80-90% stenoses          Diffuse 80% diag disease Ramus: Large vessel. Prox 75% stenosis Lcx: Mid 95%, OM1 95% bifurcation stenosis RCA: Mid 80%, followed by 75% stenoses          Distal 80% stenosis   Successful percutaneous coronary intervention prox-mid LAD     Aspiration thrombectomy, PTCA, and overlapping stents placement     Synergy DES 4.0X12 mm, 3.5X48 mm, 3.0X16 mm     This was necessitated by flow limiting lesions beyond the culprit lesion     Post dilataation with 4.0X15 mm [place] balloon upto 18 atm      We will consider nonculprit PCI to remaining vessels during index hospitalization.     Echocardiogram 03/21/2022:  1. Left ventricular  ejection fraction, by estimation, is 40 to 45%. The  left ventricle has mildly decreased function. The left ventricle  demonstrates regional wall motion abnormalities (see scoring  diagram/findings for description). There is mild left  ventricular hypertrophy. Left ventricular diastolic parameters are  indeterminate. There is severe hypokinesis of the left ventricular,  mid-apical anteroseptal wall and anterior wall. There is akinesis of the  left ventricular, apical segment.   2. Right ventricular systolic function is normal. The right ventricular  size is normal.   3. The mitral valve is normal in structure. No evidence of mitral valve  regurgitation. No evidence of mitral stenosis.   4. The aortic valve is normal in structure. Aortic valve regurgitation is  not visualized. No aortic stenosis is present.     Assessment & Recommendations:  36 y.o. Caucasian male  with hypertension, hyperlipidemia, family h/o CAD with STEMI  STEMI: Culprit prox LAD with downstream severe lesions in mid LAD, also flow limiting S/p Aspiration thrombectomy, PTCA, and overlapping stents placement prox-mid LAD     Synergy DES 4.0X12 mm, 3.5X48 mm, 3.0X16 mm Residual severe stenoses in prox ramus, prox Lcx/OM1, prox, distal RCA.  Diffuse disease diagonal 1 best treated medically. EF 40-45%, severe hypokinesis/akinesis LAD territory. Continue Aspirin, cangrelor. Continue Crestor 20 mg daily. Lipoprotein (a) only 60s. Continue losartan to 25 mg  daily, metoprolol succinate to 25 mg daily. Continue Jardiance 10 mg daily.  Lipid panel performed this morning shows surprisingly normal triglycerides and LDL of only 82.  I suspect this may be falsely lowered in the setting of recent MI.  I will repeat lipid panel and direct LDL again in 1 month time.  Appreciate Dr. Leonarda Salon input. After discussion with Dr. Roxan Hockey, my interventional colleagues, we felt that arterial grafts for residual severe non-culprit  stenoses was his best option for longevity. Stopped brilinta, switched to IV cangrelor.  Awaiting CABG, scheduled for Friday 10/6.  HFrEF: Euvolemic. EF 40-45%. Meds as above.   With EF >35%, unlikely to be approved for Life Vest. Na 130-132 likely dilutional hyponatremia. IV lasix 40 mg today.  Mixed hyperlipidemia: See above. Could consider enrolling in EVOLVE-MI trial (Early Repatha vs standard of care).  Gout: Continue allupurinol and colchicine. Would avoid NSAID or steroids going into CABG.     Nigel Mormon, MD Pager: [phone] Office: [phone]

## 2022-03-26 NOTE — Plan of Care (Signed)

## 2022-03-26 NOTE — Progress Notes (Signed)
5 Days Post-Op Procedure(s) (LRB): Coronary/Graft Acute MI Revascularization (N/A) Subjective: Still bothered by gout. Pain is better this AM, really wasn't able to walk much yesterday, will try again today No CP  Objective: Vital signs in last 24 hours: Temp:  [98.8 F (37.1 C)-99.6 F (37.6 C)] 98.8 F (37.1 C) (10/05 0259) Pulse Rate:  [92-94] 92 (10/05 0923) Cardiac Rhythm: Normal sinus rhythm (10/05 0721) Resp:  [16-18] 16 (10/05 0721) BP: (108-120)/(64-80) 108/71 (10/05 0923) SpO2:  [98 %] 98 % (10/05 0721)  Hemodynamic parameters for last 24 hours:    Intake/Output from previous day: 10/04 0701 - 10/05 0700 In: 1077.2 [P.O.:480; I.V.:597.2] Out: 2100 [Urine:2100] Intake/Output this shift: Total I/O In: 76.5 [I.V.:76.5] Out: 300 [Urine:300]  General appearance: alert, cooperative, and no distress Neurologic: intact Heart: regular rate and rhythm Lungs: clear to auscultation bilaterally  Lab Results: Recent Labs    03/25/22 0026 03/26/22 0020  WBC 13.1* 11.9*  HGB 14.9 14.4  HCT 42.3 39.1  PLT 309 312   BMET:  Recent Labs    03/25/22 0026 03/26/22 0020  NA 130* 132*  K 3.7 3.8  CL 96* 100  CO2 23 21*  GLUCOSE 103* 93  BUN 16 19  CREATININE 1.13 1.10  CALCIUM 8.9 8.6*    PT/INR: No results for input(s): "LABPROT", "INR" in the last 72 hours. ABG    Component Value Date/Time   TCO2 21 (L) 03/21/2022 0128   CBG (last 3)  No results for input(s): "GLUCAP" in the last 72 hours.  Assessment/Plan: S/P Procedure(s) (LRB): Coronary/Graft Acute MI Revascularization (N/A) S/p STEMI with PCI of LAD , residual severe 2 vessel CAD For CABG with Left radial tomorrow All questions answered Left radial Ok per vascular studies   LOS: 5 days    Melrose Nakayama 03/26/2022

## 2022-03-26 NOTE — Progress Notes (Signed)
Pt ambulating in hallway with friend. Will f/u later.   Christen Bame 03/26/2022 10:56 AM'

## 2022-03-26 NOTE — Discharge Instructions (Signed)

## 2022-03-26 NOTE — Plan of Care (Signed)
  Problem: Clinical Measurements: Goal: Respiratory complications will improve Outcome: Progressing Goal: Cardiovascular complication will be avoided Outcome: Progressing   Problem: Activity: Goal: Risk for activity intolerance will decrease Outcome: Progressing   Problem: Nutrition: Goal: Adequate nutrition will be maintained Outcome: Progressing   Problem: Coping: Goal: Level of anxiety will decrease Outcome: Progressing   

## 2022-03-26 NOTE — Progress Notes (Signed)
   03/26/22 1300  Clinical Encounter Type  Visited With Patient and family together;Health care provider  Visit Type Follow-up  Referral From Nurse  Consult/Referral To Chaplain Melvenia Beam)  Recommendations Facilitate Notary and Witnesses   Chaplain paged to facilitate notarization of Advance Directive for [name]. [place].   [name]. [place] has selected her Mother, Jahni Nazar [phone], as his [place].   [name]. Keay signed his Advance Directive in the presence of Notary: Yvone Neu, Notary, and  Hospital Volunteers: Georgann Housekeeper and Verdene Rio, all whom who witnessed her signing and have attested to with their signatures on Part C of Mr. Arch Methot. [place] Advance Directive -  Part A: Health Care Power of Attorney and  Part B: Living Will  Copy was prepared for [place]. One copy for patient's chart. Scanned into Mr. Kelden Lavallee. [facility] electronic Medical Record. Original was returned to Mr. Juandiego Kolenovic. [place].  [address] [place], [place]., [phone]

## 2022-03-26 NOTE — Progress Notes (Signed)
CARDIAC REHAB PHASE I   PRE:  Rate/Rhythm: 96 NSR      SaO2: 98 RA  MODE:  Ambulation: 340 ft   POST:  Rate/Rhythm: 106 ST     SaO2: 98 RA  Pt was ambulated through hallway with standby assistance and IV stand. Pt had some gout pain but no CP. Pt was returned to room w/o c/p.  1400-1430  Christen Bame  2:27 PM 03/26/2022

## 2022-03-26 NOTE — Progress Notes (Signed)
+   of staph . Aureus from surg. Pcr, bactroban and CHG wipe initiated.

## 2022-03-26 NOTE — Hospital Course (Addendum)
HPI: This is a 36 year old male with a history of hypertension, asthma, gout, nephrolithiasis, panic attacks, and a family history of CAD. He smoked briefly but quit 9 years ago.    He had onset of chest pain around 5 PM the evening of 09/30. This waxed and waned initially and he thought is was indigestion. Around midnight, the pain worsened to 10/10. He laid down and became diaphoretic.  Coworkers called EMS and he was brought to ED as a code STEMI.  He underwent emergent cath which showed occluded LAD and severe 3 vessel CAD. Dr. Virgina Jock was able to reopen LAD with aspiration thrombectomy, PTCA and stent placement.  He has had residual 1/10 CP unchanged with activity since procedure.   In retrospect, he has had similar sensations, much less severe over the past few months. Dr. Roxan Hockey discussed that PCA and CABG are both reasonable options for the ramus intermediate and RCA. The Circumflex disease, however, is more problematic. Dr. Roxan Hockey felt CABG was his best option. Potential risks, benefits, and complications of the surgery were discussed with the patient. He took some time to decide that he would like to proceed with coronary artery bypass grafting surgery.  Hospital Course: Patient underwent a CABG x 2. He was transported from the OR to [facility] ICU in stable condition. Patient was extubated by 5:30 PM on the day of surgery.  Vital signs and hemodynamics remained stable.  The monitoring lines and pacer wires were removed on the first postoperative day.  Brilinta was resumed.  He was mobilized with ambulation the ICU on postop day 1.  Diet was advanced and well-tolerated.  He was transferred to 4E [facility] on postop day 2.

## 2022-03-26 NOTE — Anesthesia Preprocedure Evaluation (Addendum)
Anesthesia Evaluation  Patient identified by MRN, date of birth, ID band Patient awake    Reviewed: Allergy & Precautions, NPO status , Patient's Chart, lab work & pertinent test results  History of Anesthesia Complications Negative for: history of anesthetic complications  Airway Mallampati: I  TM Distance: >3 FB Neck ROM: Full    Dental  (+) Dental Advisory Given, Teeth Intact   Pulmonary former smoker   breath sounds clear to auscultation       Cardiovascular hypertension, Pt. on medications (-) angina + CAD, + Past MI and + Cardiac Stents (LAD)   Rhythm:Regular Rate:Normal  03/21/2022 ECHO: EF 40-45%, severe hypokinesis of the antero-septal and anterior walls, apical akinesis, mild LVH, normal RVF, no significant valvular abnormalities   Neuro/Psych  Headaches  Anxiety        GI/Hepatic negative GI ROS, Neg liver ROS,,,  Endo/Other  BMI 34.7  Renal/GU H/o stones     Musculoskeletal   Abdominal  (+) + obese  Peds  Hematology Aggrastat at stent placement 9/30   Anesthesia Other Findings   Reproductive/Obstetrics                              Anesthesia Physical Anesthesia Plan  ASA: 4  Anesthesia Plan: General   Post-op Pain Management:    Induction: Intravenous  PONV Risk Score and Plan: 2 and Treatment may vary due to age or medical condition  Airway Management Planned: Oral ETT  Additional Equipment: Arterial line, PA Cath, TEE and Ultrasound Guidance Line Placement  Intra-op Plan:   Post-operative Plan: Post-operative intubation/ventilation  Informed Consent: I have reviewed the patients History and Physical, chart, labs and discussed the procedure including the risks, benefits and alternatives for the proposed anesthesia with the patient or authorized representative who has indicated his/her understanding and acceptance.     Dental advisory given  Plan Discussed  with: CRNA and Surgeon  Anesthesia Plan Comments:          Anesthesia Quick Evaluation

## 2022-03-27 ENCOUNTER — Inpatient Hospital Stay (HOSPITAL_COMMUNITY)
Admission: EM | Disposition: A | Discharge status: Home / Self Care | Payer: Self-pay | Source: Home / Self Care | Attending: Cardiology

## 2022-03-27 ENCOUNTER — Other Ambulatory Visit: Payer: Self-pay

## 2022-03-27 ENCOUNTER — Inpatient Hospital Stay (HOSPITAL_COMMUNITY): Payer: 59

## 2022-03-27 ENCOUNTER — Inpatient Hospital Stay (HOSPITAL_COMMUNITY): Payer: 59 | Admitting: Anesthesiology

## 2022-03-27 DIAGNOSIS — I10 Essential (primary) hypertension: Secondary | ICD-10-CM

## 2022-03-27 DIAGNOSIS — I2510 Atherosclerotic heart disease of native coronary artery without angina pectoris: Secondary | ICD-10-CM

## 2022-03-27 DIAGNOSIS — N35912 Unspecified bulbous urethral stricture, male: Secondary | ICD-10-CM

## 2022-03-27 DIAGNOSIS — I252 Old myocardial infarction: Secondary | ICD-10-CM

## 2022-03-27 DIAGNOSIS — Z87891 Personal history of nicotine dependence: Secondary | ICD-10-CM

## 2022-03-27 HISTORY — PX: TEE WITHOUT CARDIOVERSION: SHX5443

## 2022-03-27 HISTORY — PX: CYSTOSCOPY: SHX5120

## 2022-03-27 HISTORY — PX: CORONARY ARTERY BYPASS GRAFT: SHX141

## 2022-03-27 HISTORY — PX: RADIAL ARTERY HARVEST: SHX5067

## 2022-03-27 LAB — POCT I-STAT, CHEM 8
BUN: 18 mg/dL (ref 6–20)
BUN: 17 mg/dL (ref 6–20)
BUN: 17 mg/dL (ref 6–20)
BUN: 17 mg/dL (ref 6–20)
BUN: 18 mg/dL (ref 6–20)
Calcium, Ion: 1.17 mmol/L (ref 1.15–1.40)
Calcium, Ion: 1.16 mmol/L (ref 1.15–1.40)
Calcium, Ion: 1.05 mmol/L — ABNORMAL LOW (ref 1.15–1.40)
Calcium, Ion: 1.02 mmol/L — ABNORMAL LOW (ref 1.15–1.40)
Calcium, Ion: 1.03 mmol/L — ABNORMAL LOW (ref 1.15–1.40)
Chloride: 101 mmol/L (ref 98–111)
Chloride: 103 mmol/L (ref 98–111)
Chloride: 99 mmol/L (ref 98–111)
Chloride: 102 mmol/L (ref 98–111)
Chloride: 103 mmol/L (ref 98–111)
Creatinine, Ser: 0.9 mg/dL (ref 0.61–1.24)
Creatinine, Ser: 0.8 mg/dL (ref 0.61–1.24)
Creatinine, Ser: 0.7 mg/dL (ref 0.61–1.24)
Creatinine, Ser: 0.8 mg/dL (ref 0.61–1.24)
Creatinine, Ser: 0.7 mg/dL (ref 0.61–1.24)
Glucose, Bld: 106 mg/dL — ABNORMAL HIGH (ref 70–99)
Glucose, Bld: 115 mg/dL — ABNORMAL HIGH (ref 70–99)
Glucose, Bld: 142 mg/dL — ABNORMAL HIGH (ref 70–99)
Glucose, Bld: 150 mg/dL — ABNORMAL HIGH (ref 70–99)
Glucose, Bld: 145 mg/dL — ABNORMAL HIGH (ref 70–99)
HCT: 37 % — ABNORMAL LOW (ref 39.0–52.0)
HCT: 34 % — ABNORMAL LOW (ref 39.0–52.0)
HCT: 30 % — ABNORMAL LOW (ref 39.0–52.0)
HCT: 30 % — ABNORMAL LOW (ref 39.0–52.0)
HCT: 29 % — ABNORMAL LOW (ref 39.0–52.0)
Hemoglobin: 12.6 g/dL — ABNORMAL LOW (ref 13.0–17.0)
Hemoglobin: 11.6 g/dL — ABNORMAL LOW (ref 13.0–17.0)
Hemoglobin: 10.2 g/dL — ABNORMAL LOW (ref 13.0–17.0)
Hemoglobin: 10.2 g/dL — ABNORMAL LOW (ref 13.0–17.0)
Hemoglobin: 9.9 g/dL — ABNORMAL LOW (ref 13.0–17.0)
Potassium: 3.7 mmol/L (ref 3.5–5.1)
Potassium: 4.7 mmol/L (ref 3.5–5.1)
Potassium: 5 mmol/L (ref 3.5–5.1)
Potassium: 5.4 mmol/L — ABNORMAL HIGH (ref 3.5–5.1)
Potassium: 4.6 mmol/L (ref 3.5–5.1)
Sodium: 135 mmol/L (ref 135–145)
Sodium: 133 mmol/L — ABNORMAL LOW (ref 135–145)
Sodium: 132 mmol/L — ABNORMAL LOW (ref 135–145)
Sodium: 133 mmol/L — ABNORMAL LOW (ref 135–145)
Sodium: 137 mmol/L (ref 135–145)
TCO2: 26 mmol/L (ref 22–32)
TCO2: 24 mmol/L (ref 22–32)
TCO2: 25 mmol/L (ref 22–32)
TCO2: 23 mmol/L (ref 22–32)
TCO2: 25 mmol/L (ref 22–32)

## 2022-03-27 LAB — POCT I-STAT 7, (LYTES, BLD GAS, ICA,H+H)
Acid-Base Excess: 0 mmol/L (ref 0.0–2.0)
Acid-Base Excess: 0 mmol/L (ref 0.0–2.0)
Acid-Base Excess: 0 mmol/L (ref 0.0–2.0)
Acid-base deficit: 1 mmol/L (ref 0.0–2.0)
Acid-base deficit: 3 mmol/L — ABNORMAL HIGH (ref 0.0–2.0)
Acid-base deficit: 3 mmol/L — ABNORMAL HIGH (ref 0.0–2.0)
Acid-base deficit: 3 mmol/L — ABNORMAL HIGH (ref 0.0–2.0)
Acid-base deficit: 3 mmol/L — ABNORMAL HIGH (ref 0.0–2.0)
Acid-base deficit: 5 mmol/L — ABNORMAL HIGH (ref 0.0–2.0)
Bicarbonate: 24.2 mmol/L (ref 20.0–28.0)
Bicarbonate: 25.1 mmol/L (ref 20.0–28.0)
Bicarbonate: 25.2 mmol/L (ref 20.0–28.0)
Bicarbonate: 22.8 mmol/L (ref 20.0–28.0)
Bicarbonate: 25.3 mmol/L (ref 20.0–28.0)
Bicarbonate: 23.8 mmol/L (ref 20.0–28.0)
Bicarbonate: 22.7 mmol/L (ref 20.0–28.0)
Bicarbonate: 22 mmol/L (ref 20.0–28.0)
Bicarbonate: 20.3 mmol/L (ref 20.0–28.0)
Calcium, Ion: 1.17 mmol/L (ref 1.15–1.40)
Calcium, Ion: 1.01 mmol/L — ABNORMAL LOW (ref 1.15–1.40)
Calcium, Ion: 1.04 mmol/L — ABNORMAL LOW (ref 1.15–1.40)
Calcium, Ion: 1.02 mmol/L — ABNORMAL LOW (ref 1.15–1.40)
Calcium, Ion: 1.06 mmol/L — ABNORMAL LOW (ref 1.15–1.40)
Calcium, Ion: 1.03 mmol/L — ABNORMAL LOW (ref 1.15–1.40)
Calcium, Ion: 1.08 mmol/L — ABNORMAL LOW (ref 1.15–1.40)
Calcium, Ion: 1.09 mmol/L — ABNORMAL LOW (ref 1.15–1.40)
Calcium, Ion: 1.1 mmol/L — ABNORMAL LOW (ref 1.15–1.40)
HCT: 39 % (ref 39.0–52.0)
HCT: 30 % — ABNORMAL LOW (ref 39.0–52.0)
HCT: 31 % — ABNORMAL LOW (ref 39.0–52.0)
HCT: 31 % — ABNORMAL LOW (ref 39.0–52.0)
HCT: 31 % — ABNORMAL LOW (ref 39.0–52.0)
HCT: 31 % — ABNORMAL LOW (ref 39.0–52.0)
HCT: 30 % — ABNORMAL LOW (ref 39.0–52.0)
HCT: 28 % — ABNORMAL LOW (ref 39.0–52.0)
HCT: 26 % — ABNORMAL LOW (ref 39.0–52.0)
Hemoglobin: 13.3 g/dL (ref 13.0–17.0)
Hemoglobin: 10.2 g/dL — ABNORMAL LOW (ref 13.0–17.0)
Hemoglobin: 10.5 g/dL — ABNORMAL LOW (ref 13.0–17.0)
Hemoglobin: 10.5 g/dL — ABNORMAL LOW (ref 13.0–17.0)
Hemoglobin: 10.5 g/dL — ABNORMAL LOW (ref 13.0–17.0)
Hemoglobin: 10.5 g/dL — ABNORMAL LOW (ref 13.0–17.0)
Hemoglobin: 10.2 g/dL — ABNORMAL LOW (ref 13.0–17.0)
Hemoglobin: 9.5 g/dL — ABNORMAL LOW (ref 13.0–17.0)
Hemoglobin: 8.8 g/dL — ABNORMAL LOW (ref 13.0–17.0)
O2 Saturation: 100 %
O2 Saturation: 100 %
O2 Saturation: 100 %
O2 Saturation: 100 %
O2 Saturation: 100 %
O2 Saturation: 100 %
O2 Saturation: 99 %
O2 Saturation: 99 %
O2 Saturation: 99 %
Patient temperature: 36.4
Patient temperature: 37
Patient temperature: 37
Potassium: 3.7 mmol/L (ref 3.5–5.1)
Potassium: 4.8 mmol/L (ref 3.5–5.1)
Potassium: 4.9 mmol/L (ref 3.5–5.1)
Potassium: 5.1 mmol/L (ref 3.5–5.1)
Potassium: 5.4 mmol/L — ABNORMAL HIGH (ref 3.5–5.1)
Potassium: 4.5 mmol/L (ref 3.5–5.1)
Potassium: 4.6 mmol/L (ref 3.5–5.1)
Potassium: 5 mmol/L (ref 3.5–5.1)
Potassium: 4.8 mmol/L (ref 3.5–5.1)
Sodium: 135 mmol/L (ref 135–145)
Sodium: 132 mmol/L — ABNORMAL LOW (ref 135–145)
Sodium: 136 mmol/L (ref 135–145)
Sodium: 133 mmol/L — ABNORMAL LOW (ref 135–145)
Sodium: 133 mmol/L — ABNORMAL LOW (ref 135–145)
Sodium: 137 mmol/L (ref 135–145)
Sodium: 137 mmol/L (ref 135–145)
Sodium: 137 mmol/L (ref 135–145)
Sodium: 137 mmol/L (ref 135–145)
TCO2: 25 mmol/L (ref 22–32)
TCO2: 26 mmol/L (ref 22–32)
TCO2: 26 mmol/L (ref 22–32)
TCO2: 24 mmol/L (ref 22–32)
TCO2: 27 mmol/L (ref 22–32)
TCO2: 25 mmol/L (ref 22–32)
TCO2: 24 mmol/L (ref 22–32)
TCO2: 23 mmol/L (ref 22–32)
TCO2: 21 mmol/L — ABNORMAL LOW (ref 22–32)
pCO2 arterial: 39.9 mmHg (ref 32–48)
pCO2 arterial: 42.3 mmHg (ref 32–48)
pCO2 arterial: 44.6 mmHg (ref 32–48)
pCO2 arterial: 40.7 mmHg (ref 32–48)
pCO2 arterial: 44.6 mmHg (ref 32–48)
pCO2 arterial: 46.9 mmHg (ref 32–48)
pCO2 arterial: 41.5 mmHg (ref 32–48)
pCO2 arterial: 38.2 mmHg (ref 32–48)
pCO2 arterial: 36.3 mmHg (ref 32–48)
pH, Arterial: 7.392 (ref 7.35–7.45)
pH, Arterial: 7.359 (ref 7.35–7.45)
pH, Arterial: 7.383 (ref 7.35–7.45)
pH, Arterial: 7.355 (ref 7.35–7.45)
pH, Arterial: 7.361 (ref 7.35–7.45)
pH, Arterial: 7.312 — ABNORMAL LOW (ref 7.35–7.45)
pH, Arterial: 7.344 — ABNORMAL LOW (ref 7.35–7.45)
pH, Arterial: 7.368 (ref 7.35–7.45)
pH, Arterial: 7.356 (ref 7.35–7.45)
pO2, Arterial: 457 mmHg — ABNORMAL HIGH (ref 83–108)
pO2, Arterial: 350 mmHg — ABNORMAL HIGH (ref 83–108)
pO2, Arterial: 375 mmHg — ABNORMAL HIGH (ref 83–108)
pO2, Arterial: 234 mmHg — ABNORMAL HIGH (ref 83–108)
pO2, Arterial: 383 mmHg — ABNORMAL HIGH (ref 83–108)
pO2, Arterial: 337 mmHg — ABNORMAL HIGH (ref 83–108)
pO2, Arterial: 144 mmHg — ABNORMAL HIGH (ref 83–108)
pO2, Arterial: 137 mmHg — ABNORMAL HIGH (ref 83–108)
pO2, Arterial: 135 mmHg — ABNORMAL HIGH (ref 83–108)

## 2022-03-27 LAB — BASIC METABOLIC PANEL
Anion gap: 12 (ref 5–15)
Anion gap: 8 (ref 5–15)
BUN: 17 mg/dL (ref 6–20)
BUN: 15 mg/dL (ref 6–20)
CO2: 22 mmol/L (ref 22–32)
CO2: 21 mmol/L — ABNORMAL LOW (ref 22–32)
Calcium: 9.1 mg/dL (ref 8.9–10.3)
Calcium: 7.7 mg/dL — ABNORMAL LOW (ref 8.9–10.3)
Chloride: 100 mmol/L (ref 98–111)
Chloride: 107 mmol/L (ref 98–111)
Creatinine, Ser: 1.1 mg/dL (ref 0.61–1.24)
Creatinine, Ser: 0.93 mg/dL (ref 0.61–1.24)
GFR, Estimated: >60 mL/min (ref >60)
GFR, Estimated: >60 mL/min (ref >60)
Glucose, Bld: 98 mg/dL (ref 70–99)
Glucose, Bld: 163 mg/dL — ABNORMAL HIGH (ref 70–99)
Potassium: 3.5 mmol/L (ref 3.5–5.1)
Potassium: 4.7 mmol/L (ref 3.5–5.1)
Sodium: 134 mmol/L — ABNORMAL LOW (ref 135–145)
Sodium: 136 mmol/L (ref 135–145)

## 2022-03-27 LAB — CBC
HCT: 40.5 % (ref 39.0–52.0)
HCT: 30.7 % — ABNORMAL LOW (ref 39.0–52.0)
HCT: 29.8 % — ABNORMAL LOW (ref 39.0–52.0)
Hemoglobin: 14.9 g/dL (ref 13.0–17.0)
Hemoglobin: 11 g/dL — ABNORMAL LOW (ref 13.0–17.0)
Hemoglobin: 10.6 g/dL — ABNORMAL LOW (ref 13.0–17.0)
MCH: 31 pg (ref 26.0–34.0)
MCH: 31.4 pg (ref 26.0–34.0)
MCH: 31.2 pg (ref 26.0–34.0)
MCHC: 36.8 g/dL — ABNORMAL HIGH (ref 30.0–36.0)
MCHC: 35.8 g/dL (ref 30.0–36.0)
MCHC: 35.6 g/dL (ref 30.0–36.0)
MCV: 84.4 fL (ref 80.0–100.0)
MCV: 87.7 fL (ref 80.0–100.0)
MCV: 87.6 fL (ref 80.0–100.0)
Platelets: 365 10*3/uL (ref 150–400)
Platelets: 202 10*3/uL (ref 150–400)
Platelets: 254 10*3/uL (ref 150–400)
RBC: 4.8 MIL/uL (ref 4.22–5.81)
RBC: 3.5 MIL/uL — ABNORMAL LOW (ref 4.22–5.81)
RBC: 3.4 MIL/uL — ABNORMAL LOW (ref 4.22–5.81)
RDW: 11.5 % (ref 11.5–15.5)
RDW: 11.4 % — ABNORMAL LOW (ref 11.5–15.5)
RDW: 11.7 % (ref 11.5–15.5)
WBC: 9.4 10*3/uL (ref 4.0–10.5)
WBC: 12.8 10*3/uL — ABNORMAL HIGH (ref 4.0–10.5)
WBC: 11.6 10*3/uL — ABNORMAL HIGH (ref 4.0–10.5)
nRBC: 0 % (ref 0.0–0.2)
nRBC: 0 % (ref 0.0–0.2)
nRBC: 0 % (ref 0.0–0.2)

## 2022-03-27 LAB — ECHO INTRAOPERATIVE TEE
AV Mean grad: 3 mmHg
Height: 69 in
Weight: 3760.17 oz

## 2022-03-27 LAB — POCT I-STAT EG7
Acid-Base Excess: 0 mmol/L (ref 0.0–2.0)
Bicarbonate: 25.5 mmol/L (ref 20.0–28.0)
Calcium, Ion: 1.04 mmol/L — ABNORMAL LOW (ref 1.15–1.40)
HCT: 31 % — ABNORMAL LOW (ref 39.0–52.0)
Hemoglobin: 10.5 g/dL — ABNORMAL LOW (ref 13.0–17.0)
O2 Saturation: 73 %
Potassium: 4.5 mmol/L (ref 3.5–5.1)
Sodium: 135 mmol/L (ref 135–145)
TCO2: 27 mmol/L (ref 22–32)
pCO2, Ven: 45.4 mmHg (ref 44–60)
pH, Ven: 7.357 (ref 7.25–7.43)
pO2, Ven: 41 mmHg (ref 32–45)

## 2022-03-27 LAB — PROTIME-INR
INR: 1.5 — ABNORMAL HIGH (ref 0.8–1.2)
Prothrombin Time: 18.2 seconds — ABNORMAL HIGH (ref 11.4–15.2)

## 2022-03-27 LAB — MAGNESIUM: Magnesium: 3.2 mg/dL — ABNORMAL HIGH (ref 1.7–2.4)

## 2022-03-27 LAB — GLUCOSE, CAPILLARY
Glucose-Capillary: 152 mg/dL — ABNORMAL HIGH (ref 70–99)
Glucose-Capillary: 155 mg/dL — ABNORMAL HIGH (ref 70–99)
Glucose-Capillary: 139 mg/dL — ABNORMAL HIGH (ref 70–99)
Glucose-Capillary: 137 mg/dL — ABNORMAL HIGH (ref 70–99)
Glucose-Capillary: 137 mg/dL — ABNORMAL HIGH (ref 70–99)
Glucose-Capillary: 171 mg/dL — ABNORMAL HIGH (ref 70–99)
Glucose-Capillary: 151 mg/dL — ABNORMAL HIGH (ref 70–99)

## 2022-03-27 LAB — HEMOGLOBIN AND HEMATOCRIT, BLOOD
HCT: 30.7 % — ABNORMAL LOW (ref 39.0–52.0)
Hemoglobin: 11.2 g/dL — ABNORMAL LOW (ref 13.0–17.0)

## 2022-03-27 LAB — PLATELET COUNT: Platelets: 309 10*3/uL (ref 150–400)

## 2022-03-27 LAB — PREPARE RBC (CROSSMATCH)

## 2022-03-27 LAB — APTT: aPTT: 29 seconds (ref 24–36)

## 2022-03-27 SURGERY — CORONARY ARTERY BYPASS GRAFTING (CABG)
Anesthesia: General | Site: Chest

## 2022-03-27 MED ORDER — MAGNESIUM SULFATE 4 GM/100ML IV SOLN
4.0000 g | Freq: Once | INTRAVENOUS | Status: AC
  Administered 2022-03-27: 4 g via INTRAVENOUS
  Filled 2022-03-27: qty 100

## 2022-03-27 MED ORDER — FENTANYL CITRATE (PF) 250 MCG/5ML IJ SOLN
INTRAMUSCULAR | Status: AC
  Filled 2022-03-27: qty 5

## 2022-03-27 MED ORDER — TRAMADOL HCL 50 MG PO TABS
50.0000 mg | ORAL_TABLET | ORAL | Status: DC | PRN
  Administered 2022-03-28 – 2022-03-30 (×5): 100 mg via ORAL
  Filled 2022-03-27 (×5): qty 2

## 2022-03-27 MED ORDER — OXYCODONE HCL 5 MG PO TABS
5.0000 mg | ORAL_TABLET | ORAL | Status: DC | PRN
  Administered 2022-03-27: 5 mg via ORAL
  Administered 2022-03-28 (×7): 10 mg via ORAL
  Administered 2022-03-29 (×2): 5 mg via ORAL
  Administered 2022-03-29 (×4): 10 mg via ORAL
  Administered 2022-03-30 – 2022-03-31 (×6): 5 mg via ORAL
  Administered 2022-03-31: 10 mg via ORAL
  Filled 2022-03-27 (×3): qty 2
  Filled 2022-03-27: qty 1
  Filled 2022-03-27 (×2): qty 2
  Filled 2022-03-27: qty 1
  Filled 2022-03-27: qty 2
  Filled 2022-03-27 (×2): qty 1
  Filled 2022-03-27 (×2): qty 2
  Filled 2022-03-27: qty 1
  Filled 2022-03-27 (×2): qty 2
  Filled 2022-03-27 (×2): qty 1
  Filled 2022-03-27: qty 2
  Filled 2022-03-27 (×2): qty 1
  Filled 2022-03-27: qty 2

## 2022-03-27 MED ORDER — SODIUM CHLORIDE 0.45 % IV SOLN: INTRAVENOUS | Status: DC | PRN

## 2022-03-27 MED ORDER — ARTIFICIAL TEARS OPHTHALMIC OINT
TOPICAL_OINTMENT | OPHTHALMIC | Status: AC
  Filled 2022-03-27: qty 3.5

## 2022-03-27 MED ORDER — PHENYLEPHRINE 80 MCG/ML (10ML) SYRINGE FOR IV PUSH (FOR BLOOD PRESSURE SUPPORT)
PREFILLED_SYRINGE | INTRAVENOUS | Status: DC | PRN
  Administered 2022-03-27 (×2): 80 ug via INTRAVENOUS
  Administered 2022-03-27: 160 ug via INTRAVENOUS
  Administered 2022-03-27 (×3): 80 ug via INTRAVENOUS

## 2022-03-27 MED ORDER — DEXAMETHASONE SODIUM PHOSPHATE 10 MG/ML IJ SOLN
INTRAMUSCULAR | Status: DC | PRN
  Administered 2022-03-27: 8 mg via INTRAVENOUS

## 2022-03-27 MED ORDER — LACTATED RINGERS IV SOLN: INTRAVENOUS | Status: DC | PRN

## 2022-03-27 MED ORDER — MIDAZOLAM HCL 2 MG/2ML IJ SOLN: 2.0000 mg | INTRAMUSCULAR | Status: DC | PRN

## 2022-03-27 MED ORDER — TRAZODONE HCL 50 MG PO TABS
50.0000 mg | ORAL_TABLET | Freq: Every day | ORAL | Status: DC
  Administered 2022-03-29 – 2022-03-30 (×2): 50 mg via ORAL
  Filled 2022-03-27 (×3): qty 1

## 2022-03-27 MED ORDER — PROTAMINE SULFATE 10 MG/ML IV SOLN
INTRAVENOUS | Status: AC
  Filled 2022-03-27: qty 10

## 2022-03-27 MED ORDER — FAMOTIDINE IN NACL 20-0.9 MG/50ML-% IV SOLN
20.0000 mg | Freq: Two times a day (BID) | INTRAVENOUS | Status: AC
  Administered 2022-03-27: 20 mg via INTRAVENOUS
  Filled 2022-03-27: qty 50

## 2022-03-27 MED ORDER — HEPARIN SODIUM (PORCINE) 1000 UNIT/ML IJ SOLN
INTRAMUSCULAR | Status: AC
  Filled 2022-03-27: qty 1

## 2022-03-27 MED ORDER — SODIUM CHLORIDE 0.9% FLUSH
3.0000 mL | Freq: Two times a day (BID) | INTRAVENOUS | Status: DC
  Administered 2022-03-28 – 2022-03-29 (×3): 3 mL via INTRAVENOUS

## 2022-03-27 MED ORDER — 0.9 % SODIUM CHLORIDE (POUR BTL) OPTIME
TOPICAL | Status: DC | PRN
  Administered 2022-03-27: 5000 mL

## 2022-03-27 MED ORDER — ROCURONIUM BROMIDE 10 MG/ML (PF) SYRINGE
PREFILLED_SYRINGE | INTRAVENOUS | Status: AC
  Filled 2022-03-27: qty 10

## 2022-03-27 MED ORDER — PROPOFOL 10 MG/ML IV BOLUS
INTRAVENOUS | Status: DC | PRN
  Administered 2022-03-27 (×2): 20 mg via INTRAVENOUS
  Administered 2022-03-27: 40 mg via INTRAVENOUS
  Administered 2022-03-27: 50 mg via INTRAVENOUS

## 2022-03-27 MED ORDER — MIDAZOLAM HCL (PF) 10 MG/2ML IJ SOLN
INTRAMUSCULAR | Status: AC
  Filled 2022-03-27: qty 2

## 2022-03-27 MED ORDER — SODIUM CHLORIDE 0.9% FLUSH: 3.0000 mL | INTRAVENOUS | Status: DC | PRN

## 2022-03-27 MED ORDER — SODIUM CHLORIDE 0.9 % IV SOLN: INTRAVENOUS | Status: DC | PRN

## 2022-03-27 MED ORDER — ACETAMINOPHEN 650 MG RE SUPP
650.0000 mg | Freq: Once | RECTAL | Status: AC
  Administered 2022-03-27: 650 mg via RECTAL

## 2022-03-27 MED ORDER — PHENYLEPHRINE HCL-NACL 20-0.9 MG/250ML-% IV SOLN: 0.0000 ug/min | INTRAVENOUS | Status: DC

## 2022-03-27 MED ORDER — PROTAMINE SULFATE 10 MG/ML IV SOLN
INTRAVENOUS | Status: AC
  Filled 2022-03-27: qty 25

## 2022-03-27 MED ORDER — ACETAMINOPHEN 500 MG PO TABS
1000.0000 mg | ORAL_TABLET | Freq: Four times a day (QID) | ORAL | Status: DC
  Administered 2022-03-28 – 2022-03-31 (×15): 1000 mg via ORAL
  Filled 2022-03-27 (×14): qty 2

## 2022-03-27 MED ORDER — MIDAZOLAM HCL (PF) 5 MG/ML IJ SOLN
INTRAMUSCULAR | Status: DC | PRN
  Administered 2022-03-27: 2 mg via INTRAVENOUS
  Administered 2022-03-27: 1 mg via INTRAVENOUS
  Administered 2022-03-27 (×2): 2 mg via INTRAVENOUS
  Administered 2022-03-27: 3 mg via INTRAVENOUS
  Administered 2022-03-27: 2 mg via INTRAVENOUS

## 2022-03-27 MED ORDER — MIDAZOLAM HCL 2 MG/2ML IJ SOLN
INTRAMUSCULAR | Status: AC
  Filled 2022-03-27: qty 2

## 2022-03-27 MED ORDER — DIPHENHYDRAMINE HCL 50 MG/ML IJ SOLN
INTRAMUSCULAR | Status: DC | PRN
  Administered 2022-03-27: 12.5 mg via INTRAVENOUS
  Administered 2022-03-27: 37.5 mg via INTRAVENOUS

## 2022-03-27 MED ORDER — PROTAMINE SULFATE 10 MG/ML IV SOLN
INTRAVENOUS | Status: DC | PRN
  Administered 2022-03-27: 380 mg via INTRAVENOUS

## 2022-03-27 MED ORDER — ALBUMIN HUMAN 5 % IV SOLN
250.0000 mL | INTRAVENOUS | Status: AC | PRN
  Administered 2022-03-27 (×3): 12.5 g via INTRAVENOUS
  Filled 2022-03-27: qty 250

## 2022-03-27 MED ORDER — METOPROLOL TARTRATE 5 MG/5ML IV SOLN: 2.5000 mg | INTRAVENOUS | Status: DC | PRN

## 2022-03-27 MED ORDER — LACTATED RINGERS IV SOLN: INTRAVENOUS | Status: DC

## 2022-03-27 MED ORDER — LACTATED RINGERS IV SOLN: 500.0000 mL | Freq: Once | INTRAVENOUS | Status: DC | PRN

## 2022-03-27 MED ORDER — ARTIFICIAL TEARS OPHTHALMIC OINT
TOPICAL_OINTMENT | OPHTHALMIC | Status: DC | PRN
  Administered 2022-03-27: 1 via OPHTHALMIC

## 2022-03-27 MED ORDER — ALBUMIN HUMAN 5 % IV SOLN: INTRAVENOUS | Status: DC | PRN

## 2022-03-27 MED ORDER — NITROGLYCERIN IN D5W 200-5 MCG/ML-% IV SOLN
0.0000 ug/min | INTRAVENOUS | Status: DC
  Administered 2022-03-27: 5 ug/min via INTRAVENOUS
  Filled 2022-03-27: qty 250

## 2022-03-27 MED ORDER — DEXMEDETOMIDINE HCL IN NACL 400 MCG/100ML IV SOLN
0.0000 ug/kg/h | INTRAVENOUS | Status: DC
  Administered 2022-03-27: 0.2 ug/kg/h via INTRAVENOUS
  Filled 2022-03-27: qty 100

## 2022-03-27 MED ORDER — INSULIN REGULAR(HUMAN) IN NACL 100-0.9 UT/100ML-% IV SOLN: INTRAVENOUS | Status: DC

## 2022-03-27 MED ORDER — LIDOCAINE 2% (20 MG/ML) 5 ML SYRINGE
INTRAMUSCULAR | Status: AC
  Filled 2022-03-27: qty 5

## 2022-03-27 MED ORDER — HEPARIN SODIUM (PORCINE) 1000 UNIT/ML IJ SOLN
INTRAMUSCULAR | Status: AC
  Filled 2022-03-27: qty 10

## 2022-03-27 MED ORDER — CHLORHEXIDINE GLUCONATE CLOTH 2 % EX PADS
6.0000 | MEDICATED_PAD | Freq: Every day | CUTANEOUS | Status: DC
  Administered 2022-03-28 – 2022-03-31 (×3): 6 via TOPICAL

## 2022-03-27 MED ORDER — DOCUSATE SODIUM 100 MG PO CAPS
200.0000 mg | ORAL_CAPSULE | Freq: Every day | ORAL | Status: DC
  Administered 2022-03-28 – 2022-03-30 (×3): 200 mg via ORAL
  Filled 2022-03-27 (×3): qty 2

## 2022-03-27 MED ORDER — POTASSIUM CHLORIDE 10 MEQ/50ML IV SOLN: 10.0000 meq | INTRAVENOUS | Status: AC

## 2022-03-27 MED ORDER — PLASMA-LYTE A IV SOLN
INTRAVENOUS | Status: DC | PRN
  Administered 2022-03-27: 500 mL

## 2022-03-27 MED ORDER — ASPIRIN 81 MG PO CHEW: 324.0000 mg | CHEWABLE_TABLET | Freq: Every day | ORAL | Status: DC

## 2022-03-27 MED ORDER — SODIUM CHLORIDE 0.9 % IV SOLN: INTRAVENOUS | Status: DC

## 2022-03-27 MED ORDER — PHENYLEPHRINE 80 MCG/ML (10ML) SYRINGE FOR IV PUSH (FOR BLOOD PRESSURE SUPPORT): PREFILLED_SYRINGE | INTRAVENOUS | Status: DC | PRN

## 2022-03-27 MED ORDER — PANTOPRAZOLE SODIUM 40 MG PO TBEC
40.0000 mg | DELAYED_RELEASE_TABLET | Freq: Every day | ORAL | Status: DC
  Administered 2022-03-29 – 2022-03-31 (×3): 40 mg via ORAL
  Filled 2022-03-27 (×3): qty 1

## 2022-03-27 MED ORDER — LEVOFLOXACIN IN D5W 750 MG/150ML IV SOLN
750.0000 mg | INTRAVENOUS | Status: AC
  Administered 2022-03-28: 750 mg via INTRAVENOUS
  Filled 2022-03-27: qty 150

## 2022-03-27 MED ORDER — METOPROLOL TARTRATE 12.5 MG HALF TABLET
12.5000 mg | ORAL_TABLET | Freq: Two times a day (BID) | ORAL | Status: DC
  Administered 2022-03-28 – 2022-03-29 (×4): 12.5 mg via ORAL
  Filled 2022-03-27 (×5): qty 1

## 2022-03-27 MED ORDER — BISACODYL 5 MG PO TBEC
10.0000 mg | DELAYED_RELEASE_TABLET | Freq: Every day | ORAL | Status: DC
  Administered 2022-03-28 – 2022-03-29 (×2): 10 mg via ORAL
  Filled 2022-03-27 (×3): qty 2

## 2022-03-27 MED ORDER — SODIUM CHLORIDE (PF) 0.9 % IJ SOLN
OROMUCOSAL | Status: DC | PRN
  Administered 2022-03-27 (×3): 4 mL via TOPICAL

## 2022-03-27 MED ORDER — ASPIRIN 325 MG PO TBEC
325.0000 mg | DELAYED_RELEASE_TABLET | Freq: Every day | ORAL | Status: DC
  Administered 2022-03-28: 325 mg via ORAL
  Filled 2022-03-27: qty 1

## 2022-03-27 MED ORDER — DIPHENHYDRAMINE HCL 50 MG/ML IJ SOLN
INTRAMUSCULAR | Status: AC
  Filled 2022-03-27: qty 1

## 2022-03-27 MED ORDER — DIAZEPAM 5 MG PO TABS
5.0000 mg | ORAL_TABLET | Freq: Once | ORAL | Status: AC
  Administered 2022-03-27: 5 mg via ORAL
  Filled 2022-03-27: qty 1

## 2022-03-27 MED ORDER — VANCOMYCIN HCL IN DEXTROSE 1-5 GM/200ML-% IV SOLN
1000.0000 mg | Freq: Once | INTRAVENOUS | Status: AC
  Administered 2022-03-27: 1000 mg via INTRAVENOUS
  Filled 2022-03-27: qty 200

## 2022-03-27 MED ORDER — BISACODYL 10 MG RE SUPP: 10.0000 mg | Freq: Every day | RECTAL | Status: DC

## 2022-03-27 MED ORDER — PHENYLEPHRINE 80 MCG/ML (10ML) SYRINGE FOR IV PUSH (FOR BLOOD PRESSURE SUPPORT)
PREFILLED_SYRINGE | INTRAVENOUS | Status: AC
  Filled 2022-03-27: qty 10

## 2022-03-27 MED ORDER — DEXAMETHASONE SODIUM PHOSPHATE 10 MG/ML IJ SOLN
INTRAMUSCULAR | Status: AC
  Filled 2022-03-27: qty 1

## 2022-03-27 MED ORDER — HEPARIN SODIUM (PORCINE) 1000 UNIT/ML IJ SOLN
INTRAMUSCULAR | Status: DC | PRN
  Administered 2022-03-27: 36000 [IU] via INTRAVENOUS
  Administered 2022-03-27: 2000 [IU] via INTRAVENOUS

## 2022-03-27 MED ORDER — FENTANYL CITRATE (PF) 250 MCG/5ML IJ SOLN
INTRAMUSCULAR | Status: DC | PRN
  Administered 2022-03-27 (×2): 50 ug via INTRAVENOUS
  Administered 2022-03-27: 500 ug via INTRAVENOUS
  Administered 2022-03-27: 200 ug via INTRAVENOUS
  Administered 2022-03-27 (×5): 50 ug via INTRAVENOUS
  Administered 2022-03-27: 200 ug via INTRAVENOUS

## 2022-03-27 MED ORDER — MORPHINE SULFATE (PF) 2 MG/ML IV SOLN
1.0000 mg | INTRAVENOUS | Status: DC | PRN
  Administered 2022-03-27 (×2): 4 mg via INTRAVENOUS
  Administered 2022-03-27: 2 mg via INTRAVENOUS
  Administered 2022-03-27: 4 mg via INTRAVENOUS
  Administered 2022-03-28: 2 mg via INTRAVENOUS
  Filled 2022-03-27 (×3): qty 2
  Filled 2022-03-27 (×2): qty 1
  Filled 2022-03-27: qty 2

## 2022-03-27 MED ORDER — PROTAMINE SULFATE 10 MG/ML IV SOLN
INTRAVENOUS | Status: AC
  Filled 2022-03-27: qty 5

## 2022-03-27 MED ORDER — METOPROLOL TARTRATE 25 MG/10 ML ORAL SUSPENSION
12.5000 mg | Freq: Two times a day (BID) | ORAL | Status: DC
  Filled 2022-03-27 (×2): qty 5

## 2022-03-27 MED ORDER — PROPOFOL 10 MG/ML IV BOLUS
INTRAVENOUS | Status: AC
  Filled 2022-03-27: qty 20

## 2022-03-27 MED ORDER — SODIUM CHLORIDE 0.9 % IV SOLN: 250.0000 mL | INTRAVENOUS | Status: DC

## 2022-03-27 MED ORDER — DEXTROSE 50 % IV SOLN: 0.0000 mL | INTRAVENOUS | Status: DC | PRN

## 2022-03-27 MED ORDER — ONDANSETRON HCL 4 MG/2ML IJ SOLN
4.0000 mg | Freq: Four times a day (QID) | INTRAMUSCULAR | Status: DC | PRN
  Administered 2022-03-28 – 2022-03-29 (×2): 4 mg via INTRAVENOUS
  Filled 2022-03-27 (×2): qty 2

## 2022-03-27 MED ORDER — HEMOSTATIC AGENTS (NO CHARGE) OPTIME
TOPICAL | Status: DC | PRN
  Administered 2022-03-27: 1 via TOPICAL

## 2022-03-27 MED ORDER — ACETAMINOPHEN 160 MG/5ML PO SOLN: 650.0000 mg | Freq: Once | ORAL | Status: AC

## 2022-03-27 MED ORDER — CHLORHEXIDINE GLUCONATE 0.12 % MT SOLN
15.0000 mL | OROMUCOSAL | Status: AC
  Administered 2022-03-27: 15 mL via OROMUCOSAL
  Filled 2022-03-27: qty 15

## 2022-03-27 MED ORDER — ROCURONIUM BROMIDE 10 MG/ML (PF) SYRINGE
PREFILLED_SYRINGE | INTRAVENOUS | Status: DC | PRN
  Administered 2022-03-27 (×3): 50 mg via INTRAVENOUS
  Administered 2022-03-27: 20 mg via INTRAVENOUS
  Administered 2022-03-27: 50 mg via INTRAVENOUS
  Administered 2022-03-27: 80 mg via INTRAVENOUS

## 2022-03-27 MED ORDER — ACETAMINOPHEN 160 MG/5ML PO SOLN: 1000.0000 mg | Freq: Four times a day (QID) | ORAL | Status: DC

## 2022-03-27 SURGICAL SUPPLY — 123 items
ADH SKN CLS APL DERMABOND .7 (GAUZE/BANDAGES/DRESSINGS) ×3
APPLIER CLIP 9.375 SM OPEN (CLIP)
APR CLP SM 9.3 20 MLT OPN (CLIP)
BAG DECANTER FOR FLEXI CONT (MISCELLANEOUS) ×3 IMPLANT
BLADE CLIPPER SURG (BLADE) ×3 IMPLANT
BLADE STERNUM SYSTEM 6 (BLADE) ×3 IMPLANT
BLADE SURG 15 STRL LF DISP TIS (BLADE) IMPLANT
BLADE SURG 15 STRL SS (BLADE) ×3
BNDG ELASTIC 4X5.8 VLCR STR LF (GAUZE/BANDAGES/DRESSINGS) ×3 IMPLANT
BNDG ELASTIC 6X5.8 VLCR STR LF (GAUZE/BANDAGES/DRESSINGS) ×3 IMPLANT
BNDG GAUZE DERMACEA FLUFF 4 (GAUZE/BANDAGES/DRESSINGS) ×3 IMPLANT
BNDG GZE DERMACEA 4 6PLY (GAUZE/BANDAGES/DRESSINGS) ×3
CANISTER SUCT 3000ML PPV (MISCELLANEOUS) ×3 IMPLANT
CANNULA EZ GLIDE AORTIC 21FR (CANNULA) ×3 IMPLANT
CANNULA MC2 2 STG 36/46 NON-V (CANNULA) IMPLANT
CANNULA VENOUS 2 STG 34/46 (CANNULA) ×3
CATH COUDE FOLEY 2W 5CC 16FR (CATHETERS) IMPLANT
CATH CPB KIT GERHARDT (MISCELLANEOUS) IMPLANT
CATH CPB KIT HENDRICKSON (MISCELLANEOUS) ×3 IMPLANT
CATH FOLEY 2W COUNCIL 5CC 16FR (CATHETERS) IMPLANT
CATH FOLEY 2WAY SLVR  5CC 12FR (CATHETERS)
CATH FOLEY 2WAY SLVR 5CC 12FR (CATHETERS) IMPLANT
CATH INTERMIT  6FR 70CM (CATHETERS) IMPLANT
CATH ROBINSON RED A/P 18FR (CATHETERS) ×3 IMPLANT
CATH SET URETHRAL DILATOR (CATHETERS) IMPLANT
CATH THORACIC 36FR (CATHETERS) ×3 IMPLANT
CATH THORACIC 36FR RT ANG (CATHETERS) ×3 IMPLANT
CLIP APPLIE 9.375 SM OPEN (CLIP) IMPLANT
CLIP FOGARTY SPRING 6M (CLIP) IMPLANT
CLIP TI WIDE RED SMALL 24 (CLIP) IMPLANT
CLIP VESOCCLUDE MED 24/CT (CLIP) IMPLANT
CLIP VESOCCLUDE SM WIDE 24/CT (CLIP) IMPLANT
CONN ST 1/4X3/8  BEN (MISCELLANEOUS) ×3
CONN ST 1/4X3/8 BEN (MISCELLANEOUS) IMPLANT
CONTAINER PROTECT SURGISLUSH (MISCELLANEOUS) ×6 IMPLANT
COVER MAYO STAND STRL (DRAPES) IMPLANT
COVER SURGICAL LIGHT HANDLE (MISCELLANEOUS) IMPLANT
CUFF TOURN SGL QUICK 18X4 (TOURNIQUET CUFF) IMPLANT
CUFF TOURN SGL QUICK 24 (TOURNIQUET CUFF)
CUFF TRNQT CYL 24X4X16.5-23 (TOURNIQUET CUFF) IMPLANT
DERMABOND ADVANCED .7 DNX12 (GAUZE/BANDAGES/DRESSINGS) IMPLANT
DRAPE CARDIOVASCULAR INCISE (DRAPES) ×3
DRAPE EXTREMITY T 121X128X90 (DISPOSABLE) ×3 IMPLANT
DRAPE HALF SHEET 40X57 (DRAPES) IMPLANT
DRAPE SRG 135X102X78XABS (DRAPES) ×3 IMPLANT
DRAPE WARM FLUID 44X44 (DRAPES) ×3 IMPLANT
DRSG COVADERM 4X14 (GAUZE/BANDAGES/DRESSINGS) ×3 IMPLANT
ELECT BLADE 4.0 EZ CLEAN MEGAD (MISCELLANEOUS) ×3
ELECT REM PT RETURN 9FT ADLT (ELECTROSURGICAL) ×6
ELECTRODE BLDE 4.0 EZ CLN MEGD (MISCELLANEOUS) IMPLANT
ELECTRODE REM PT RTRN 9FT ADLT (ELECTROSURGICAL) ×6 IMPLANT
FELT TEFLON 1X6 (MISCELLANEOUS) ×6 IMPLANT
GAUZE 4X4 16PLY ~~LOC~~+RFID DBL (SPONGE) ×3 IMPLANT
GAUZE SPONGE 4X4 12PLY STRL (GAUZE/BANDAGES/DRESSINGS) ×6 IMPLANT
GEL ULTRASOUND 20GR AQUASONIC (MISCELLANEOUS) IMPLANT
GLOVE BIO SURGEON STRL SZ 6 (GLOVE) IMPLANT
GLOVE BIO SURGEON STRL SZ7 (GLOVE) IMPLANT
GLOVE BIOGEL PI IND STRL 6 (GLOVE) IMPLANT
GLOVE SS BIOGEL STRL SZ 7.5 (GLOVE) ×3 IMPLANT
GLOVE SURG SIGNA 7.5 PF LTX (GLOVE) ×9 IMPLANT
GOWN STRL REUS W/ TWL LRG LVL3 (GOWN DISPOSABLE) ×12 IMPLANT
GOWN STRL REUS W/ TWL XL LVL3 (GOWN DISPOSABLE) ×6 IMPLANT
GOWN STRL REUS W/TWL LRG LVL3 (GOWN DISPOSABLE) ×12
GOWN STRL REUS W/TWL XL LVL3 (GOWN DISPOSABLE) ×6
GUIDEWIRE STR DUAL SENSOR (WIRE) IMPLANT
HEMOSTAT POWDER SURGIFOAM 1G (HEMOSTASIS) ×9 IMPLANT
HEMOSTAT SURGICEL 2X14 (HEMOSTASIS) ×3 IMPLANT
INSERT FOGARTY XLG (MISCELLANEOUS) IMPLANT
KIT BASIN OR (CUSTOM PROCEDURE TRAY) ×3 IMPLANT
KIT SUCTION CATH 14FR (SUCTIONS) ×6 IMPLANT
KIT TURNOVER KIT B (KITS) ×3 IMPLANT
KIT VASOVIEW HEMOPRO 2 VH 4000 (KITS) ×3 IMPLANT
MARKER GRAFT CORONARY BYPASS (MISCELLANEOUS) ×9 IMPLANT
NS IRRIG 1000ML POUR BTL (IV SOLUTION) ×15 IMPLANT
PACK E OPEN HEART (SUTURE) ×3 IMPLANT
PACK OPEN HEART (CUSTOM PROCEDURE TRAY) ×3 IMPLANT
PAD ARMBOARD 7.5X6 YLW CONV (MISCELLANEOUS) ×6 IMPLANT
PAD ELECT DEFIB RADIOL ZOLL (MISCELLANEOUS) ×3 IMPLANT
PENCIL BUTTON HOLSTER BLD 10FT (ELECTRODE) ×3 IMPLANT
POSITIONER HEAD DONUT 9IN (MISCELLANEOUS) ×3 IMPLANT
PUNCH AORTIC ROTATE 4.0MM (MISCELLANEOUS) IMPLANT
PUNCH AORTIC ROTATE 4.5MM 8IN (MISCELLANEOUS) IMPLANT
PUNCH AORTIC ROTATE 5MM 8IN (MISCELLANEOUS) IMPLANT
SET CYSTO W/LG BORE CLAMP LF (SET/KITS/TRAYS/PACK) IMPLANT
SET MPS 3-ND DEL (MISCELLANEOUS) IMPLANT
SHEARS HARMONIC 9CM CVD (BLADE) ×3 IMPLANT
SPONGE T-LAP 18X18 ~~LOC~~+RFID (SPONGE) ×12 IMPLANT
SPONGE T-LAP 4X18 ~~LOC~~+RFID (SPONGE) ×3 IMPLANT
SUPPORT HEART JANKE-BARRON (MISCELLANEOUS) ×3 IMPLANT
SUT BONE WAX W31G (SUTURE) ×3 IMPLANT
SUT ETHIBOND NAB MH 2-0 36IN (SUTURE) IMPLANT
SUT MNCRL AB 4-0 PS2 18 (SUTURE) IMPLANT
SUT PROLENE 3 0 SH DA (SUTURE) ×3 IMPLANT
SUT PROLENE 4 0 RB 1 (SUTURE) ×6
SUT PROLENE 4 0 SH DA (SUTURE) IMPLANT
SUT PROLENE 4-0 RB1 .5 CRCL 36 (SUTURE) IMPLANT
SUT PROLENE 6 0 C 1 30 (SUTURE) ×6 IMPLANT
SUT PROLENE 7 0 BV 1 (SUTURE) IMPLANT
SUT PROLENE 7 0 BV1 MDA (SUTURE) ×3 IMPLANT
SUT PROLENE 8 0 BV175 6 (SUTURE) IMPLANT
SUT SILK  1 MH (SUTURE) ×6
SUT SILK 1 MH (SUTURE) IMPLANT
SUT STEEL 6MS V (SUTURE) ×3 IMPLANT
SUT STEEL STERNAL CCS#1 18IN (SUTURE) IMPLANT
SUT STEEL SZ 6 DBL 3X14 BALL (SUTURE) ×3 IMPLANT
SUT VIC AB 1 CTX 36 (SUTURE) ×6
SUT VIC AB 1 CTX36XBRD ANBCTR (SUTURE) ×6 IMPLANT
SUT VIC AB 2-0 CT1 27 (SUTURE)
SUT VIC AB 2-0 CT1 TAPERPNT 27 (SUTURE) IMPLANT
SUT VIC AB 2-0 CTX 27 (SUTURE) IMPLANT
SUT VIC AB 3-0 SH 27 (SUTURE) ×3
SUT VIC AB 3-0 SH 27X BRD (SUTURE) IMPLANT
SUT VIC AB 3-0 X1 27 (SUTURE) IMPLANT
SUT VICRYL 4-0 PS2 18IN ABS (SUTURE) IMPLANT
SYR 50ML SLIP (SYRINGE) IMPLANT
SYSTEM SAHARA CHEST DRAIN ATS (WOUND CARE) ×3 IMPLANT
TAPE CLOTH SURG 6X10 WHT LF (GAUZE/BANDAGES/DRESSINGS) IMPLANT
TOWEL GREEN STERILE (TOWEL DISPOSABLE) ×3 IMPLANT
TOWEL GREEN STERILE FF (TOWEL DISPOSABLE) ×3 IMPLANT
TRAY FOLEY SLVR 16FR TEMP STAT (SET/KITS/TRAYS/PACK) ×3 IMPLANT
TUBING LAP HI FLOW INSUFFLATIO (TUBING) ×3 IMPLANT
UNDERPAD 30X36 HEAVY ABSORB (UNDERPADS AND DIAPERS) ×3 IMPLANT
WATER STERILE IRR 1000ML POUR (IV SOLUTION) ×6 IMPLANT

## 2022-03-27 NOTE — Progress Notes (Signed)
EVENING ROUNDS NOTE :     [email or web address]       [place] [address]             [phone]                 Day of Surgery Procedure(s) (LRB): CORONARY ARTERY BYPASS GRAFTING (CABG) x2 , USING BILATERAL INTERNAL MAMMARY ARTERIES (N/A) ATTEMPTED RADIAL ARTERY HARVEST (Left) TRANSESOPHAGEAL ECHOCARDIOGRAM (TEE) (N/A) CYSTOSCOPY FLEXIBLE, PLACEMENT OF FOLEY CATHETER (N/A)   Total Length of Stay:  LOS: 6 days  Events:   Weaning to extubate Good hemodynamics Low CT output   BP 97/71   Pulse 86   Temp 97.7 F (36.5 C)   Resp 15   Ht 5\' 9"  (1.753 m)   Wt 106.6 kg   SpO2 100%   BMI 34.71 kg/m   PAP: (11-41)/(5-36) 21/16 CO:  [4.6 L/min] 4.6 L/min CI:  [2.1 L/min/m2] 2.1 L/min/m2  Vent Mode: PSV;PRVC;SIMV FiO2 (%):  [50 %] 50 % Set Rate:  [12 bmp] 12 bmp Vt Set:  [700 mL] 700 mL PEEP:  [5 cmH20] 5 cmH20 Pressure Support:  [10 cmH20] 10 cmH20 Plateau Pressure:  [18 cmH20] 18 cmH20   sodium chloride 20 mL/hr at 03/27/22 1521   [START ON 03/28/2022] sodium chloride     sodium chloride 10 mL/hr at 03/27/22 1528   albumin human     dexmedetomidine (PRECEDEX) IV infusion 0.9 mcg/kg/hr (03/27/22 1500)   famotidine (PEPCID) IV 20 mg (03/27/22 1523)   insulin 2.2 Units/hr (03/27/22 1511)   lactated ringers     lactated ringers     lactated ringers 20 mL/hr at 03/27/22 1500   [START ON 03/28/2022] levofloxacin (LEVAQUIN) IV     magnesium sulfate 4 g (03/27/22 1518)   nitroGLYCERIN 5 mcg/min (03/27/22 1540)   phenylephrine (NEO-SYNEPHRINE) Adult infusion 0 mcg/min (03/27/22 1500)   potassium chloride     vancomycin      I/O last 3 completed shifts: In: 2561.6 [P.O.:1510; I.V.:1051.6] Out: 2050 [Urine:2050]      Latest Ref Rng & Units 03/27/2022    3:04 PM 03/27/2022    2:06 PM 03/27/2022    2:01 PM  CBC  WBC 4.0 - 10.5 K/uL 12.8     Hemoglobin 13.0 - 17.0 g/dL 11.0  10.5  9.9   Hematocrit 39.0 - 52.0 % 30.7  31.0  29.0   Platelets 150 - 400 K/uL 202           Latest Ref Rng & Units 03/27/2022    2:06 PM 03/27/2022    2:01 PM 03/27/2022    1:14 PM  BMP  Glucose 70 - 99 mg/dL  145    BUN 6 - 20 mg/dL  18    Creatinine 0.61 - 1.24 mg/dL  0.70    Sodium 135 - 145 mmol/L 137  137  133   Potassium 3.5 - 5.1 mmol/L 4.5  4.6  5.4   Chloride 98 - 111 mmol/L  103      ABG    Component Value Date/Time   PHART 7.312 (L) 03/27/2022 1406   PCO2ART 46.9 03/27/2022 1406   PO2ART 337 (H) 03/27/2022 1406   HCO3 23.8 03/27/2022 1406   TCO2 25 03/27/2022 1406   ACIDBASEDEF 3.0 (H) 03/27/2022 1406   O2SAT 100 03/27/2022 1406       Melodie Bouillon, MD 03/27/2022 4:00 PM

## 2022-03-27 NOTE — Consult Note (Signed)
Urology Consult   Physician requesting consult: Dr. Roxan Hockey  Reason for consult: Urethral stricture  History of Present Illness: Michael Hart is a 36 y.o. with a history of CAD here for CABG.  He is a patient of Dr. Junious Silk who he follows with a history of urethral stricture.  He underwent cystoscopy in January 2019 with a pinpoint bulbar stricture.  He canceled a procedure to formally dilate his stricture.  He has not been seen in our office since 2020.  Nursing staff was unable to place Foley catheter with multiple attempts.  Urology was called to aid with Foley catheter placement.  No HPI was taken as he was intubated and sedated.  Past Medical History:  Diagnosis Date   Allergy    Asthma    Gout    Kidney stone    Panic attacks    nocturnal, controls with self directed biofeedback   Primary hypertension 03/03/2022    Past Surgical History:  Procedure Laterality Date   CORONARY/GRAFT ACUTE MI REVASCULARIZATION N/A 03/21/2022   Procedure: Coronary/Graft Acute MI Revascularization;  Surgeon: Nigel Mormon, MD;  Location: [facility];  Service: Cardiovascular;  Laterality: N/A;   NO PAST SURGERIES      Current Hospital Medications:  Home Meds:  No current facility-administered medications on file prior to encounter.   Current Outpatient Medications on File Prior to Encounter  Medication Sig Dispense Refill   allopurinol (ZYLOPRIM) 100 MG tablet Take 1 tablet (100 mg total) by mouth daily. 30 tablet 5   amLODipine (NORVASC) 5 MG tablet Take 1 tablet by mouth daily. 30 tablet 2   bismuth subsalicylate (PEPTO BISMOL) 262 MG chewable tablet Chew 524 mg by mouth 2 (two) times daily as needed for indigestion.     cetirizine (ZYRTEC) 10 MG tablet Take 10 mg by mouth as needed.     rosuvastatin (CRESTOR) 5 MG tablet Take 1 tablet (5 mg total) by mouth daily. 90 tablet 0   TART CHERRY PO Take 1 tablet by mouth daily.     rizatriptan (MAXALT-MLT) 10 MG  disintegrating tablet Dissolve 1 tablet (10 mg total) by mouth as needed for migraine. May repeat in 2 hours if needed (Patient not taking: Reported on 03/21/2022) 9 tablet 11     Scheduled Meds:  [MAR Hold] allopurinol  100 mg Oral Daily   [MAR Hold] aspirin EC  81 mg Oral Daily   [MAR Hold] Chlorhexidine Gluconate Cloth  6 each Topical Daily   [MAR Hold] Chlorhexidine Gluconate Cloth  6 each Topical Q0600   [MAR Hold] colchicine  0.6 mg Oral BID   [MAR Hold] empagliflozin  10 mg Oral Daily   epinephrine  0-10 mcg/min Intravenous To OR   heparin sodium (porcine) 2,500 Units, papaverine 30 mg in electrolyte-A (PLASMALYTE-A PH 7.4) 500 mL irrigation   Irrigation To OR   insulin   Intravenous To OR   [MAR Hold] losartan  25 mg Oral Daily   magnesium sulfate  40 mEq Other To OR   [MAR Hold] metoprolol succinate  25 mg Oral Daily   [MAR Hold] mupirocin ointment  1 Application Nasal BID   potassium chloride  80 mEq Other To OR   [MAR Hold] rosuvastatin  20 mg Oral Daily   [MAR Hold] sodium chloride flush  3 mL Intravenous Q12H   tranexamic acid  2 mg/kg Intracatheter To OR   [MAR Hold] traZODone  50 mg Oral QHS   Continuous Infusions:  [MAR Hold]  sodium chloride Stopped (03/22/22 0726)   dexmedetomidine     heparin 30,000 units/NS 1000 mL solution for CELLSAVER     milrinone     [MAR Hold] nitroGLYCERIN Stopped (03/22/22 0712)   norepinephrine     PRN Meds:.[MAR Hold] sodium chloride, [MAR Hold] acetaminophen, [MAR Hold] diphenhydrAMINE, [MAR Hold]  morphine injection, [MAR Hold] ondansetron (ZOFRAN) IV, [MAR Hold] mouth rinse, [MAR Hold] sodium chloride flush, [MAR Hold] SUMAtriptan  Allergies:  Allergies  Allergen Reactions   Penicillins Shortness Of Breath, Swelling and Other (See Comments)    Childhood reaction   Sulfa Antibiotics Shortness Of Breath, Swelling and Other (See Comments)    Childhood reaction    Family History  Problem Relation Age of Onset   Breast cancer  Mother    Lung cancer Father    Heart disease Father        CABG   COPD Father    Hypertension Other    Stroke Other    Migraines Neg Hx    Headache Neg Hx    Sleep apnea Neg Hx     Social History:  reports that he has quit smoking. He quit smokeless tobacco use about 10 years ago. He reports current alcohol use of about 2.0 standard drinks of alcohol per week. He reports that he does not use drugs.  ROS: A complete review of systems was performed.  All systems are negative except for pertinent findings as noted.  Physical Exam:  Vital signs in last 24 hours: Temp:  [98.2 F (36.8 C)-98.5 F (36.9 C)] 98.2 F (36.8 C) (10/06 0333) Pulse Rate:  [91-101] 101 (10/06 0724) Resp:  [13-23] 23 (10/06 0724) BP: (100-128)/(56-74) 123/74 (10/06 0333) SpO2:  [95 %-99 %] 96 % (10/06 0724) Weight:  [106.6 kg] 106.6 kg (10/06 0519) Constitutional: Intubated, sedated Cardiovascular: Intubated, sedated Respiratory: Ventilated GI: Soft, nondistended GU: Circumcised, patent urethral meatus  Laboratory Data:  Recent Labs    03/25/22 0026 03/26/22 0020 03/27/22 0536 03/27/22 0758 03/27/22 0801  WBC 13.1* 11.9* 9.4  --   --   HGB 14.9 14.4 14.9 12.6* 13.3  HCT 42.3 39.1 40.5 37.0* 39.0  PLT 309 312 365  --   --     Recent Labs    03/25/22 0026 03/26/22 0020 03/27/22 0536 03/27/22 0758 03/27/22 0801  NA 130* 132* 134* 135 135  K 3.7 3.8 3.5 3.7 3.7  CL 96* 100 100 101  --   GLUCOSE 103* 93 98 106*  --   BUN 16 19 17 18   --   CALCIUM 8.9 8.6* 9.1  --   --   CREATININE 1.13 1.10 1.10 0.90  --      Results for orders placed or performed during the hospital encounter of 03/21/22 (from the past 24 hour(s))  Urinalysis, Routine w reflex microscopic Urine, Clean Catch     Status: Abnormal   Collection Time: 03/26/22 11:25 AM  Result Value Ref Range   Color, Urine STRAW (A) YELLOW   APPearance CLEAR CLEAR   Specific Gravity, Urine 1.003 (L) 1.005 - 1.030   pH 5.0 5.0 - 8.0    Glucose, UA >=500 (A) NEGATIVE mg/dL   Hgb urine dipstick NEGATIVE NEGATIVE   Bilirubin Urine NEGATIVE NEGATIVE   Ketones, ur NEGATIVE NEGATIVE mg/dL   Protein, ur NEGATIVE NEGATIVE mg/dL   Nitrite NEGATIVE NEGATIVE   Leukocytes,Ua NEGATIVE NEGATIVE   RBC / HPF 0-5 0 - 5 RBC/hpf   WBC, UA 0-5 0 - 5  WBC/hpf   Bacteria, UA NONE SEEN NONE SEEN   Mucus PRESENT   Surgical pcr screen     Status: Abnormal   Collection Time: 03/26/22 11:45 AM   Specimen: Nasal Mucosa; Nasal Swab  Result Value Ref Range   MRSA, PCR NEGATIVE NEGATIVE   Staphylococcus aureus POSITIVE (A) NEGATIVE  Type and screen [place]     Status: None (Preliminary result)   Collection Time: 03/26/22  2:52 PM  Result Value Ref Range   ABO/RH(D) A POS    Antibody Screen NEG    Sample Expiration 03/29/2022,2359    Unit Number [id number]    Blood Component Type RED CELLS,LR    Unit division 00    Status of Unit ISSUED    Transfusion Status OK TO TRANSFUSE    Crossmatch Result Compatible    Unit Number [id number]    Blood Component Type RED CELLS,LR    Unit division 00    Status of Unit ISSUED    Transfusion Status OK TO TRANSFUSE    Crossmatch Result Compatible    Unit Number [id number]    Blood Component Type RED CELLS,LR    Unit division 00    Status of Unit ISSUED    Transfusion Status OK TO TRANSFUSE    Crossmatch Result      Compatible Performed at [facility], [place] [address], [place], [place] [address]    Unit Number [id number]    Blood Component Type RED CELLS,LR    Unit division 00    Status of Unit ISSUED    Transfusion Status OK TO TRANSFUSE    Crossmatch Result Compatible   Blood gas, arterial     Status: Abnormal   Collection Time: 03/26/22  3:27 PM  Result Value Ref Range   pH, Arterial 7.47 (H) 7.35 - 7.45   pCO2 arterial 29 (L) 32 - 48 mmHg   pO2, Arterial 113 (H) 83 - 108 mmHg   Bicarbonate 21.1 20.0 - 28.0 mmol/L   Acid-base deficit 1.5 0.0 - 2.0  mmol/L   O2 Saturation 99.8 %   Patient temperature 37.0    Collection site RIGHT RADIAL    Allens test (pass/fail) PASS PASS  CBC     Status: Abnormal   Collection Time: 03/27/22  5:36 AM  Result Value Ref Range   WBC 9.4 4.0 - 10.5 K/uL   RBC 4.80 4.22 - 5.81 MIL/uL   Hemoglobin 14.9 13.0 - 17.0 g/dL   HCT 40.5 39.0 - 52.0 %   MCV 84.4 80.0 - 100.0 fL   MCH 31.0 26.0 - 34.0 pg   MCHC 36.8 (H) 30.0 - 36.0 g/dL   RDW 11.5 11.5 - 15.5 %   Platelets 365 150 - 400 K/uL   nRBC 0.0 0.0 - 0.2 %  Basic metabolic panel     Status: Abnormal   Collection Time: 03/27/22  5:36 AM  Result Value Ref Range   Sodium 134 (L) 135 - 145 mmol/L   Potassium 3.5 3.5 - 5.1 mmol/L   Chloride 100 98 - 111 mmol/L   CO2 22 22 - 32 mmol/L   Glucose, Bld 98 70 - 99 mg/dL   BUN 17 6 - 20 mg/dL   Creatinine, Ser 1.10 0.61 - 1.24 mg/dL   Calcium 9.1 8.9 - 10.3 mg/dL   GFR, Estimated >60 >60 mL/min   Anion gap 12 5 - 15  Prepare RBC (crossmatch)     Status: None  Collection Time: 03/27/22  7:23 AM  Result Value Ref Range   Order Confirmation      ORDER PROCESSED BY BLOOD BANK Performed at [facility], [place] [address] [place], [place] [address]   I-STAT, [place] Clap 8     Status: Abnormal   Collection Time: 03/27/22  7:58 AM  Result Value Ref Range   Sodium 135 135 - 145 mmol/L   Potassium 3.7 3.5 - 5.1 mmol/L   Chloride 101 98 - 111 mmol/L   BUN 18 6 - 20 mg/dL   Creatinine, Ser 0.90 0.61 - 1.24 mg/dL   Glucose, Bld 106 (H) 70 - 99 mg/dL   Calcium, Ion 1.17 1.15 - 1.40 mmol/L   TCO2 26 22 - 32 mmol/L   Hemoglobin 12.6 (L) 13.0 - 17.0 g/dL   HCT 37.0 (L) 39.0 - 52.0 %  I-STAT 7, (LYTES, BLD GAS, ICA, H+H)     Status: Abnormal   Collection Time: 03/27/22  8:01 AM  Result Value Ref Range   pH, Arterial 7.392 7.35 - 7.45   pCO2 arterial 39.9 32 - 48 mmHg   pO2, Arterial 457 (H) 83 - 108 mmHg   Bicarbonate 24.2 20.0 - 28.0 mmol/L   TCO2 25 22 - 32 mmol/L   O2 Saturation 100 %   Acid-base  deficit 1.0 0.0 - 2.0 mmol/L   Sodium 135 135 - 145 mmol/L   Potassium 3.7 3.5 - 5.1 mmol/L   Calcium, Ion 1.17 1.15 - 1.40 mmol/L   HCT 39.0 39.0 - 52.0 %   Hemoglobin 13.3 13.0 - 17.0 g/dL   Sample type ARTERIAL    Recent Results (from the past 240 hour(s))  Resp Panel by RT-PCR (Flu A&B, Covid) Anterior Nasal Swab     Status: None   Collection Time: 03/21/22  1:04 AM   Specimen: Anterior Nasal Swab  Result Value Ref Range Status   SARS Coronavirus 2 by RT PCR NEGATIVE NEGATIVE Final    Comment: (NOTE) SARS-CoV-2 target nucleic acids are NOT DETECTED.  The SARS-CoV-2 RNA is generally detectable in upper respiratory specimens during the acute phase of infection. The lowest concentration of SARS-CoV-2 viral copies this assay can detect is 138 copies/mL. A negative result does not preclude SARS-Cov-2 infection and should not be used as the sole basis for treatment or other patient management decisions. A negative result may occur with  improper specimen collection/handling, submission of specimen other than nasopharyngeal swab, presence of viral mutation(s) within the areas targeted by this assay, and inadequate number of viral copies(<138 copies/mL). A negative result must be combined with clinical observations, patient history, and epidemiological information. The expected result is Negative.  Fact Sheet for Patients:  EntrepreneurPulse.com.au  Fact Sheet for Healthcare Providers:  IncredibleEmployment.be  This test is no t yet approved or cleared by the [place] FDA and  has been authorized for detection and/or diagnosis of SARS-CoV-2 by FDA under an Emergency Use Authorization (EUA). This EUA will remain  in effect (meaning this test can be used) for the duration of the COVID-19 declaration under Section 564(b)(1) of the Act, 21 U.S.C.section 360bbb-3(b)(1), unless the authorization is terminated  or revoked sooner.        Influenza A by PCR NEGATIVE NEGATIVE Final   Influenza B by PCR NEGATIVE NEGATIVE Final    Comment: (NOTE) The Xpert Xpress SARS-CoV-2/FLU/RSV plus assay is intended as an aid in the diagnosis of influenza from Nasopharyngeal swab specimens and should not be used as  a sole basis for treatment. Nasal washings and aspirates are unacceptable for Xpert Xpress SARS-CoV-2/FLU/RSV testing.  Fact Sheet for Patients: EntrepreneurPulse.com.au  Fact Sheet for Healthcare Providers: IncredibleEmployment.be  This test is not yet approved or cleared by the [place] FDA and has been authorized for detection and/or diagnosis of SARS-CoV-2 by FDA under an Emergency Use Authorization (EUA). This EUA will remain in effect (meaning this test can be used) for the duration of the COVID-19 declaration under Section 564(b)(1) of the Act, 21 U.S.C. section 360bbb-3(b)(1), unless the authorization is terminated or revoked.  Performed at [facility], [place] [address] [place], [place] [address]   MRSA Next Gen by PCR, Nasal     Status: None   Collection Time: 03/21/22  3:52 AM   Specimen: Nasal Mucosa; Nasal Swab  Result Value Ref Range Status   MRSA by PCR Next Gen NOT DETECTED NOT DETECTED Final    Comment: (NOTE) The GeneXpert MRSA Assay (FDA approved for NASAL specimens only), is one component of a comprehensive MRSA colonization surveillance program. It is not intended to diagnose MRSA infection nor to guide or monitor treatment for MRSA infections. Test performance is not FDA approved in patients less than 51 years old. Performed at [facility], [place] [address], [place], [place] [address]   Surgical pcr screen     Status: Abnormal   Collection Time: 03/26/22 11:45 AM   Specimen: Nasal Mucosa; Nasal Swab  Result Value Ref Range Status   MRSA, PCR NEGATIVE NEGATIVE Final   Staphylococcus aureus POSITIVE (A) NEGATIVE Final    Comment:  (NOTE) The Xpert SA Assay (FDA approved for NASAL specimens in patients 58 years of age and older), is one component of a comprehensive surveillance program. It is not intended to diagnose infection nor to guide or monitor treatment. Performed at [facility], [place] [address], [place], [place] [address]     Renal Function: Recent Labs    03/22/22 1124 03/23/22 0015 03/24/22 0017 03/25/22 0026 03/26/22 0020 03/27/22 0536 03/27/22 0758  CREATININE 1.07 1.15 1.07 1.13 1.10 1.10 0.90   Estimated Creatinine Clearance: 134 mL/min (by C-G formula based on SCr of 0.9 mg/dL).  Radiologic Imaging: VAS US DOPPLER PRE CABG  Result Date: 03/25/2022 PREOPERATIVE VASCULAR EVALUATION Patient Name:  Michael Hart  Date of Exam:   03/25/2022 Medical Rec #: [id number]         Accession #:    [id number] Date of Birth: 09-27-1985         Patient Gender: M Patient Age:   50 years Exam Location:  [facility] Procedure:      VAS US DOPPLER PRE CABG Referring Phys: Remo Lipps HENDRICKSON --------------------------------------------------------------------------------  Indications:      Pre-CABG. Risk Factors:     Hypertension. Comparison Study: No prior study Performing Technologist: Maudry Mayhew MHA, RVT, RDCS, RDMS  Examination Guidelines: A complete evaluation includes B-mode imaging, spectral Doppler, color Doppler, and power Doppler as needed of all accessible portions of each vessel. Bilateral testing is considered an integral part of a complete examination. Limited examinations for reoccurring indications may be performed as noted.  Right Carotid Findings: +----------+--------+--------+--------+--------+------------------+           PSV cm/sEDV cm/sStenosisDescribeComments           +----------+--------+--------+--------+--------+------------------+ CCA Prox  101     14                                         +----------+--------+--------+--------+--------+------------------+  CCA Distal86      17                      intimal thickening +----------+--------+--------+--------+--------+------------------+ ICA Prox  82      25                                         +----------+--------+--------+--------+--------+------------------+ ICA Distal86      24                                         +----------+--------+--------+--------+--------+------------------+ ECA       105                                                +----------+--------+--------+--------+--------+------------------+ +----------+--------+-------+----------------+------------+           PSV cm/sEDV cmsDescribe        Arm Pressure +----------+--------+-------+----------------+------------+ Subclavian115            Multiphasic, WNL             +----------+--------+-------+----------------+------------+ +---------+--------+--+--------+--+---------+ VertebralPSV cm/s68EDV cm/s16Antegrade +---------+--------+--+--------+--+---------+ Left Carotid Findings: +----------+--------+--------+--------+--------+------------------+           PSV cm/sEDV cm/sStenosisDescribeComments           +----------+--------+--------+--------+--------+------------------+ CCA Prox  93      14                                         +----------+--------+--------+--------+--------+------------------+ CCA Distal98      21                      intimal thickening +----------+--------+--------+--------+--------+------------------+ ICA Prox  85      30                                         +----------+--------+--------+--------+--------+------------------+ ICA Distal76      24                                         +----------+--------+--------+--------+--------+------------------+ ECA       103     10                                         +----------+--------+--------+--------+--------+------------------+ +----------+--------+--------+----------------+------------+  SubclavianPSV cm/sEDV cm/sDescribe        Arm Pressure +----------+--------+--------+----------------+------------+           84              Multiphasic, WNL             +----------+--------+--------+----------------+------------+ +---------+--------+--+--------+-+--------+ VertebralPSV cm/s22EDV cm/s2Atypical +---------+--------+--+--------+-+--------+  ABI Findings: +--------+------------------+-----+---------+--------+ Right   Rt Pressure (mmHg)IndexWaveform Comment  +--------+------------------+-----+---------+--------+ [id number]  triphasic         +--------+------------------+-----+---------+--------+ PTA                            triphasic         +--------+------------------+-----+---------+--------+ DP                             triphasic         +--------+------------------+-----+---------+--------+ +--------+------------------+-----+---------+-------+ Left    Lt Pressure (mmHg)IndexWaveform Comment +--------+------------------+-----+---------+-------+ Brachial100                    triphasic        +--------+------------------+-----+---------+-------+ PTA                            triphasic        +--------+------------------+-----+---------+-------+ DP                             triphasic        +--------+------------------+-----+---------+-------+  Right Doppler Findings: +-----------+--------+-----+---------+-----------------------------------------+ Site       PressureIndexDoppler  Comments                                  +-----------+--------+-----+---------+-----------------------------------------+ Brachial   102          triphasic                                          +-----------+--------+-----+---------+-----------------------------------------+ Radial                  triphasic                                           +-----------+--------+-----+---------+-----------------------------------------+ Ulnar                   triphasic                                          +-----------+--------+-----+---------+-----------------------------------------+ Palmar Arch                      Signal is unaffected with radial                                           compression, decreases >50% with ulnar                                     compression                               +-----------+--------+-----+---------+-----------------------------------------+  Left Doppler Findings: +-----------+--------+-----+---------+-----------------------------------------+ Site       PressureIndexDoppler  Comments                                  +-----------+--------+-----+---------+-----------------------------------------+  Brachial   100          triphasic                                          +-----------+--------+-----+---------+-----------------------------------------+ Radial                  triphasic                                          +-----------+--------+-----+---------+-----------------------------------------+ Ulnar                   triphasic                                          +-----------+--------+-----+---------+-----------------------------------------+ Palmar Arch                      Signal is unaffected with radial                                           compression, decreases >50% with ulnar                                     compression.                              +-----------+--------+-----+---------+-----------------------------------------+   Summary: Right Carotid: The extracranial vessels were near-normal with only minimal wall                thickening or plaque. Left Carotid: The extracranial vessels were near-normal with only minimal wall               thickening or plaque. Vertebrals:  Bilateral vertebral arteries demonstrate  antegrade flow. Left              vertebral artery demonstrates high resistant flow. Subclavians: Normal flow hemodynamics were seen in bilateral subclavian              arteries. Right Upper Extremity: Doppler waveforms remain within normal limits with right radial compression. Doppler waveforms decrease >50% with right ulnar compression. Left Upper Extremity: Doppler waveforms remain within normal limits with left radial compression. Doppler waveforms decrease >50% with left ulnar compression.  Electronically signed by Harold Barban MD on 03/25/2022 at 10:36:43 PM.    Final     I independently reviewed the above imaging studies.  Impression/Recommendation Urethral stricture:  -He had a pinpoint bulbar urethral stricture.  I dilated his urethra from 14 [place] to 22 [place]. -I recommend leaving catheter in place for at least 3 days.  Void trial per primary team. -I will arrange follow-up for him to see Dr. Junious Silk at [facility] urology. -Please call with questions.  Matt R. Estrellita Lasky MD 03/27/2022, 9:43 AM  [facility]  Pager: [phone]   CC: Dr. Roxan Hockey

## 2022-03-27 NOTE — Discharge Summary (Signed)
Physician Discharge Summary       [address]       [place] [address]             [phone]    Patient ID: EPIFANIO LABRADOR MRN: [id number] DOB/AGE: May 27, 1986 36 y.o.  Admit date: 03/21/2022 Discharge date: 03/31/2022  Admission Diagnoses: STEMI (ST elevation myocardial infarction) [facility]) Coronary artery disease  Discharge Diagnoses:  S/p CABG x 2 2. History of primary hypertension 3. History of gout 4. History of panic attacks    Consults: None  Procedure (s):    NAME: COLLEN, VINCENT MEDICAL RECORD NO: [id number] ACCOUNT NO: [id number] DATE OF BIRTH: 04-Aug-1985 FACILITY: [facility] LOCATION: [facility] PHYSICIAN: Revonda Standard. Roxan Hockey, MD   Operative Report    DATE OF PROCEDURE: 03/27/2022   PREOPERATIVE DIAGNOSIS:  Severe 2-vessel coronary artery disease, status post myocardial infarction.   POSTOPERATIVE DIAGNOSIS:  Severe 2-vessel coronary artery disease, status post myocardial infarction.   PROCEDURES PERFORMED:  Median sternotomy, extracorporeal circulation, coronary artery bypass grafting x 2 (left internal mammary artery to ramus intermedius and free right internal mammary artery to distal right coronary).   SURGEON:  Revonda Standard. Roxan Hockey, MD   ASSISTANT:  Lars Pinks, PA      HPI: This is a 36 year old male with a history of hypertension, asthma, gout, nephrolithiasis, panic attacks, and a family history of CAD. He smoked briefly but quit 9 years ago.    He had onset of chest pain around 5 PM the evening of 09/30. This waxed and waned initially and he thought is was indigestion. Around midnight, the pain worsened to 10/10. He laid down and became diaphoretic.  [facility] called EMS and he was brought to ED as a code STEMI.  He underwent emergent cath which showed occluded LAD and severe 3 vessel CAD. Dr. Virgina Jock was able to reopen LAD with aspiration thrombectomy, PTCA and stent placement.  He has had residual 1/10 CP unchanged  with activity since procedure.   In retrospect, he has had similar sensations, much less severe over the past few months. Dr. Roxan Hockey discussed that PCA and CABG are both reasonable options for the ramus intermediate and RCA. The Circumflex disease, however, is more problematic. Dr. Roxan Hockey felt CABG was his best option. Potential risks, benefits, and complications of the surgery were discussed with the patient. He took some time to decide that he would like to proceed with coronary artery bypass grafting surgery.  Hospital Course: Patient underwent a CABG x 2. He was transported from the OR to [facility] ICU in stable condition. Patient was extubated by 5:30 PM on the day of surgery.  Vital signs and hemodynamics remained stable.  The monitoring lines and pacer wires were removed on the first postoperative day.  Brilinta was resumed.  He was mobilized with ambulation the ICU on postop day 1.  Diet was advanced and well-tolerated.  He was transferred to 4E [facility] on postop day 2.  The patient was in sinus tachycardia, due to this his lopressor dose was increased.  The patients EPW were removed without difficulty.  He was weaned off oxygen as tolerated.    His foley catheter was removed and was able to void without difficulty.  His HR remained elevated and his lopressor dose was titrated accordingly.  He is ambulating without difficulty.  His surgical incisions are healing without evidence of infection.  He is medically stable for discharge today.    Latest Vital Signs: Blood pressure  111/82, pulse (!) 114, temperature 97.7 F (36.5 C), temperature source Oral, resp. rate 20, height _0  (1.753 m), weight 110.3 kg, SpO2 93 %.  Physical Exam:  General appearance: alert, cooperative, and no distress Heart: regular rate and rhythm and tachycardia Lungs: clear to auscultation bilaterally Abdomen: soft, non-tender; bowel sounds normal; no masses,  no organomegaly Extremities: edema  trace Wound: clean and dry  Discharge Condition: Stable and discharged to home.  Recent laboratory studies:  Lab Results  Component Value Date   WBC 13.3 (H) 03/30/2022   HGB 11.0 (L) 03/30/2022   HCT 30.2 (L) 03/30/2022   MCV 85.6 03/30/2022   PLT 302 03/30/2022   Lab Results  Component Value Date   NA 132 (L) 03/30/2022   K 3.6 03/30/2022   CL 95 (L) 03/30/2022   CO2 27 03/30/2022   CREATININE 0.89 03/30/2022   GLUCOSE 164 (H) 03/30/2022      Diagnostic Studies: DG Chest Port 1 View  Result Date: 03/29/2022 CLINICAL DATA:  Post CABG.  Assess for pneumothorax.  [id number]. EXAM: PORTABLE CHEST 1 VIEW COMPARISON:  Portable chest yesterday at 5:40 a.m. FINDINGS: 5:29 a.m. Interval removal Swan-Ganz catheter. Right IJ introducer sheath remains in place the tip in the upper SVC. No pneumothorax is seen. Bilateral chest tubes and mediastinal drain remain in place. CABG changes are again shown. There is mild cardiomegaly with normal caliber of the central vessels. Perihilar and bilateral basilar atelectatic bands and low lung volumes appear similar. No significant pleural collections. The remaining hypoaerated lungs are generally clear. No new abnormality. IMPRESSION: Swan-Ganz catheter removed with chest tubes and mediastinal drain remaining. Central and basilar atelectasis and low lung volumes are unchanged. No new abnormality. Electronically Signed   By: Telford Nab M.D.   On: 03/29/2022 06:58   DG Chest Port 1 View  Result Date: 03/28/2022 CLINICAL DATA:  Status post CABG EXAM: PORTABLE CHEST 1 VIEW COMPARISON:  03/27/2022 and prior studies FINDINGS: Cardiomediastinal silhouette is unchanged. CABG changes again noted. Endotracheal tube and enteric tube have been removed. Swan-Ganz catheter tip is now overlying the pulmonary outflow tract/main pulmonary artery. Mediastinal and thoracostomy tube is again identified as well as slightly increased mild bibasilar atelectasis. There is no  evidence of pneumothorax. IMPRESSION: 1. Endotracheal tube and enteric tube removal with slightly increased mild bibasilar atelectasis. 2. Swan-Ganz catheter tip now overlying the pulmonary outflow tract/main pulmonary artery. Electronically Signed   By: Margarette Canada M.D.   On: 03/28/2022 08:08   ECHO INTRAOPERATIVE TEE  Result Date: 03/27/2022  *INTRAOPERATIVE TRANSESOPHAGEAL REPORT *  Patient Name:   SHAVON ZENZ Date of Exam: 03/27/2022 Medical Rec #:  [id number]        Height:       69.0 in Accession #:    [id number]       Weight:       235.0 lb Date of Birth:  Nov 14, 1985        BSA:          2.21 m Patient Age:    67 years         BP:           123/74 mmHg Patient Gender: M                HR:           80 bpm. Exam Location:  Anesthesiology Transesophogeal exam was perform intraoperatively during surgical procedure. Patient was closely monitored under general anesthesia during the  entirety of examination. Indications:     Coronary Artery Disease Sonographer:     Bernadene Person RDCS Performing Phys: Amada Acres Diagnosing Phys: Annye Asa MD Complications: No known complications during this procedure. POST-OP IMPRESSIONS limited Post-CPB exam: The patient separated easily from CPB _ Left Ventricle: The left ventricular function is unchanged from pre-bypass images. Overall EF 47%. LV function remains low normal, with severe antero-septal hypokinesis. _ Right Ventricle: The right ventricular function appears unchanged from pre-bypass images. _ Aortic Valve: The aortic valve function appears unchanged from pre-bypass images. _ Mitral Valve: The mitral valve function appears unchanged from pre-bypass images. PRE-OP FINDINGS  Left Ventricle: The left ventricle has mildly reduced systolic function, with an ejection fraction of 45-50%, measured 47%. The cavity size was normal. Moderate hypokinesis of the left ventricular, mid-apical anteroseptal wall. There is no left ventricular hypertrophy.  Left ventricular diastolic function was not evaluated. Right Ventricle: The right ventricle has normal systolic function. The cavity was normal. There is no increase in right ventricular wall thickness. Catheter present in the right ventricle. Left Atrium: Left atrial size was normal in size. No left atrial/left atrial appendage thrombus was detected. Left atrial appendage velocity is normal at greater than 40 cm/s. Right Atrium: Right atrial size was normal in size. Catheter present in the right atrium. Interatrial Septum: No atrial level shunt detected by color flow Doppler. There is no evidence of a patent foramen ovale. Pericardium: There is no evidence of pericardial effusion. Mitral Valve: The mitral valve is normal in structure. Mitral valve regurgitation is trivial by color flow Doppler. There is no evidence of mitral valve vegetation. Pulmonary venous flow is normal. There is no evidence of mitral stenosis, with mean gradient 1 mmHg. Tricuspid Valve: The tricuspid valve was normal in structure. Tricuspid valve regurgitation was not visualized by color flow Doppler. No evidence of tricuspid stenosis is present. There is no evidence of tricuspid valve vegetation. Aortic Valve: The aortic valve is tricuspid. Aortic valve regurgitation was not visualized by color flow Doppler. There is no stenosis of the aortic valve, with peak gradient 5 mmHg, mean gradient 3 mmHg. There is no evidence of aortic valve vegetation. Pulmonic Valve: The pulmonic valve was normal in structure, with normal leafelt mobility. No evidence of pulmonic stenosis. Pulmonic valve regurgitation is trivial by color flow Doppler, aroung the PA catheter. Aorta: The aortic root, ascending aorta and aortic arch are normal in size and structure. There is sparse evidence of plaque in the descending aorta; Grade I, measuring 1-46m in size. Pulmonary Artery: SGordy Councilmancatheter present on the left. The pulmonary artery is of normal size. Venous: The  inferior vena cava is normal in size with greater than 50% respiratory variability, suggesting right atrial pressure of 3 mmHg. Shunts: There is no evidence of an atrial septal defect. +-------------+--------++ AORTIC VALVE          +-------------+--------++ AV Mean Grad:3.0 mmHg +-------------+--------++ +-------------+--------++ MITRAL VALVE          +-------------+--------++ MV Mean grad:1.0 mmHg +-------------+--------++  CAnnye AsaMD Electronically signed by CAnnye AsaMD Signature Date/Time: 03/27/2022/4:36:41 PM    Final    DG Chest Port 1 View  Result Date: 03/27/2022 CLINICAL DATA:  Pneumothorax.  Status post CABG. EXAM: PORTABLE CHEST 1 VIEW COMPARISON:  Chest radiograph 03/22/2022 FINDINGS: Sequelae of interval CABG are identified. A right jugular Swan-Ganz catheter terminates inferior to the right hilum. An enteric tube courses into the abdomen with side port near the  GE junction and tip not imaged. A mediastinal drain and bilateral chest tubes are present. An endotracheal tube terminates 1-1.5 cm above the carina. The cardiac silhouette is normal in size. Lung volumes are low. No airspace consolidation, edema, sizable pleural effusion, or pneumothorax is identified. IMPRESSION: Interval CABG with support devices as above. No evidence of acute airspace disease or pneumothorax. Electronically Signed   By: Logan Bores M.D.   On: 03/27/2022 15:19   VAS US DOPPLER PRE CABG  Result Date: 03/25/2022 PREOPERATIVE VASCULAR EVALUATION Patient Name:  ALLON COSTLOW  Date of Exam:   03/25/2022 Medical Rec #: [id number]         Accession #:    [id number] Date of Birth: 1986-02-10         Patient Gender: M Patient Age:   70 years Exam Location:  [facility] Procedure:      VAS US DOPPLER PRE CABG Referring Phys: Remo Lipps HENDRICKSON --------------------------------------------------------------------------------  Indications:      Pre-CABG. Risk Factors:     Hypertension.  Comparison Study: No prior study Performing Technologist: Maudry Mayhew MHA, RVT, RDCS, RDMS  Examination Guidelines: A complete evaluation includes B-mode imaging, spectral Doppler, color Doppler, and power Doppler as needed of all accessible portions of each vessel. Bilateral testing is considered an integral part of a complete examination. Limited examinations for reoccurring indications may be performed as noted.  Right Carotid Findings: +----------+--------+--------+--------+--------+------------------+           PSV cm/sEDV cm/sStenosisDescribeComments           +----------+--------+--------+--------+--------+------------------+ CCA Prox  101     14                                         +----------+--------+--------+--------+--------+------------------+ CCA Distal86      17                      intimal thickening +----------+--------+--------+--------+--------+------------------+ ICA Prox  82      25                                         +----------+--------+--------+--------+--------+------------------+ ICA Distal86      24                                         +----------+--------+--------+--------+--------+------------------+ ECA       105                                                +----------+--------+--------+--------+--------+------------------+ +----------+--------+-------+----------------+------------+           PSV cm/sEDV cmsDescribe        Arm Pressure +----------+--------+-------+----------------+------------+ Subclavian115            Multiphasic, WNL             +----------+--------+-------+----------------+------------+ +---------+--------+--+--------+--+---------+ VertebralPSV cm/s68EDV cm/s16Antegrade +---------+--------+--+--------+--+---------+ Left Carotid Findings: +----------+--------+--------+--------+--------+------------------+           PSV cm/sEDV cm/sStenosisDescribeComments            +----------+--------+--------+--------+--------+------------------+ CCA Prox  93  14                                         +----------+--------+--------+--------+--------+------------------+ CCA Distal98      21                      intimal thickening +----------+--------+--------+--------+--------+------------------+ ICA Prox  85      30                                         +----------+--------+--------+--------+--------+------------------+ ICA Distal76      24                                         +----------+--------+--------+--------+--------+------------------+ ECA       103     10                                         +----------+--------+--------+--------+--------+------------------+ +----------+--------+--------+----------------+------------+ SubclavianPSV cm/sEDV cm/sDescribe        Arm Pressure +----------+--------+--------+----------------+------------+           84              Multiphasic, WNL             +----------+--------+--------+----------------+------------+ +---------+--------+--+--------+-+--------+ VertebralPSV cm/s22EDV cm/s2Atypical +---------+--------+--+--------+-+--------+  ABI Findings: +--------+------------------+-----+---------+--------+ Right   Rt Pressure (mmHg)IndexWaveform Comment  +--------+------------------+-----+---------+--------+ Brachial102                    triphasic         +--------+------------------+-----+---------+--------+ PTA                            triphasic         +--------+------------------+-----+---------+--------+ DP                             triphasic         +--------+------------------+-----+---------+--------+ +--------+------------------+-----+---------+-------+ Left    Lt Pressure (mmHg)IndexWaveform Comment +--------+------------------+-----+---------+-------+ Brachial100                    triphasic         +--------+------------------+-----+---------+-------+ PTA                            triphasic        +--------+------------------+-----+---------+-------+ DP                             triphasic        +--------+------------------+-----+---------+-------+  Right Doppler Findings: +-----------+--------+-----+---------+-----------------------------------------+ Site       PressureIndexDoppler  Comments                                  +-----------+--------+-----+---------+-----------------------------------------+ Brachial   102          triphasic                                          +-----------+--------+-----+---------+-----------------------------------------+  Radial                  triphasic                                          +-----------+--------+-----+---------+-----------------------------------------+ Ulnar                   triphasic                                          +-----------+--------+-----+---------+-----------------------------------------+ Palmar Arch                      Signal is unaffected with radial                                           compression, decreases >50% with ulnar                                     compression                               +-----------+--------+-----+---------+-----------------------------------------+  Left Doppler Findings: +-----------+--------+-----+---------+-----------------------------------------+ Site       PressureIndexDoppler  Comments                                  +-----------+--------+-----+---------+-----------------------------------------+ Brachial   100          triphasic                                          +-----------+--------+-----+---------+-----------------------------------------+ Radial                  triphasic                                          +-----------+--------+-----+---------+-----------------------------------------+ Ulnar                    triphasic                                          +-----------+--------+-----+---------+-----------------------------------------+ Palmar Arch                      Signal is unaffected with radial                                           compression, decreases >50% with ulnar  compression.                              +-----------+--------+-----+---------+-----------------------------------------+   Summary: Right Carotid: The extracranial vessels were near-normal with only minimal wall                thickening or plaque. Left Carotid: The extracranial vessels were near-normal with only minimal wall               thickening or plaque. Vertebrals:  Bilateral vertebral arteries demonstrate antegrade flow. Left              vertebral artery demonstrates high resistant flow. Subclavians: Normal flow hemodynamics were seen in bilateral subclavian              arteries. Right Upper Extremity: Doppler waveforms remain within normal limits with right radial compression. Doppler waveforms decrease >50% with right ulnar compression. Left Upper Extremity: Doppler waveforms remain within normal limits with left radial compression. Doppler waveforms decrease >50% with left ulnar compression.  Electronically signed by Harold Barban MD on 03/25/2022 at 10:36:43 PM.    Final    CARDIAC CATHETERIZATION  Addendum Date: 03/23/2022   LM: Normal LAD: Prox 100% occlusion          Mid 80-90% stenoses          Diffuse 80% diag disease Ramus: Large vessel. Prox 75% stenosis Lcx: Mid 95%, OM1 95% bifurcation stenosis RCA: Mid 80%, followed by 75% stenoses          Distal 80% stenosis  Successful percutaneous coronary intervention prox-mid LAD     Aspiration thrombectomy, PTCA, and overlapping stents placement     Synergy DES 4.0X12 mm, 3.5X48 mm, 3.0X16 mm     This was necessitated by flow limiting lesions beyond the culprit lesion     Post dilataation with 4.0X15  mm [place] balloon upto 18 atm     We will consider non-culprit vessel revascularization to remaining vessels during index hospitalization. Nigel Mormon, MD Pager: [phone] Office: [phone]  Addendum Date: 03/23/2022   LM: Normal LAD: Prox 100% occlusion          Mid 80-90% stenoses          Diffuse 80% diag disease Ramus: Large vessel. Prox 75% stenosis Lcx: Mid 95%, OM1 95% bifurcation stenosis RCA: Mid 80%, followed by 75% stenoses          Distal 80% stenosis  Successful percutaneous coronary intervention prox-mid LAD     Aspiration thrombectomy, PTCA, and overlapping stents placement     Synergy DES 4.0X12 mm, 3.5X48 mm, 3.0X16 mm     This was necessitated by flow limiting lesions beyond the culprit lesion     Post dilataation with 4.0X15 mm [place] balloon upto 18 atm     We will consider non-culprit vessel revascularization to remaining vessels during index hospitalization. Nigel Mormon, MD Pager: [phone] Office: [phone]   Result Date: 03/23/2022 Images from the original result were not included. LM: Normal LAD: Prox 100% occlusion          Mid 80-90% stenoses          Diffuse 80% diag disease Ramus: Large vessel. Prox 75% stenosis Lcx: Mid 95%, OM1 95% bifurcation stenosis RCA: Mid 80%, followed by 75% stenoses          Distal 80% stenosis  Successful percutaneous coronary intervention prox-mid LAD     Aspiration thrombectomy, PTCA,  and overlapping stents placement     Synergy DES 4.0X12 mm, 3.5X48 mm, 3.0X16 mm     This was necessitated by flow limiting lesions beyond the culprit lesion     Post dilataation with 4.0X15 mm [place] balloon upto 18 atm     We will consider nonculprit PCI to remaining vessels during index hospitalization. Nigel Mormon, MD Pager: [phone] Office: [phone]  DG CHEST PORT 1 VIEW  Result Date: 03/22/2022 CLINICAL DATA:  Mild cardial infarction. EXAM: PORTABLE CHEST 1 VIEW COMPARISON:  02/14/2021. FINDINGS: Cardiac silhouette is normal in size  and configuration. Normal mediastinal and hilar contours. Clear lungs.  No convincing pleural effusion.  No pneumothorax. Skeletal structures are grossly intact. IMPRESSION: No active disease. Electronically Signed   By: Lajean Manes M.D.   On: 03/22/2022 13:30   ECHOCARDIOGRAM COMPLETE  Result Date: 03/21/2022    ECHOCARDIOGRAM REPORT   Patient Name:   PARTICK MUSSELMAN Date of Exam: 03/21/2022 Medical Rec #:  [id number]        Height:       70.0 in Accession #:    [id number]       Weight:       249.8 lb Date of Birth:  [birth year]        BSA:          2.294 m Patient Age:    38 years         BP:           116/85 mmHg Patient Gender: M                HR:           80 bpm. Exam Location:  Inpatient Procedure: 2D Echo, Cardiac Doppler, Color Doppler and Intracardiac            Opacification Agent Indications:     Acute MI  History:         Patient has no prior history of Echocardiogram examinations.                  Risk Factors:Dyslipidemia and Former Smoker.  Sonographer:     Clayton Lefort RDCS (AE) Referring Phys:  Reynold Bowen [facility] Diagnosing Phys: Vernell Leep MD IMPRESSIONS  1. Left ventricular ejection fraction, by estimation, is 40 to 45%. The left ventricle has mildly decreased function. The left ventricle demonstrates regional wall motion abnormalities (see scoring diagram/findings for description). There is mild left ventricular hypertrophy. Left ventricular diastolic parameters are indeterminate. There is severe hypokinesis of the left ventricular, mid-apical anteroseptal wall and anterior wall. There is akinesis of the left ventricular, apical segment.  2. Right ventricular systolic function is normal. The right ventricular size is normal.  3. The mitral valve is normal in structure. No evidence of mitral valve regurgitation. No evidence of mitral stenosis.  4. The aortic valve is normal in structure. Aortic valve regurgitation is not visualized. No aortic stenosis is present. FINDINGS  Left  Ventricle: Left ventricular ejection fraction, by estimation, is 40 to 45%. The left ventricle has mildly decreased function. The left ventricle demonstrates regional wall motion abnormalities. Severe hypokinesis of the left ventricular, mid-apical  anteroseptal wall and anterior wall. Definity contrast agent was given IV to delineate the left ventricular endocardial borders. The left ventricular internal cavity size was normal in size. There is mild left ventricular hypertrophy. Left ventricular diastolic parameters are indeterminate. Right Ventricle: The right ventricular size is normal. No increase in right ventricular wall thickness.  Right ventricular systolic function is normal. Left Atrium: Left atrial size was normal in size. Right Atrium: Right atrial size was normal in size. Pericardium: There is no evidence of pericardial effusion. Mitral Valve: The mitral valve is normal in structure. No evidence of mitral valve regurgitation. No evidence of mitral valve stenosis. Tricuspid Valve: The tricuspid valve is normal in structure. Tricuspid valve regurgitation is not demonstrated. No evidence of tricuspid stenosis. Aortic Valve: The aortic valve is normal in structure. Aortic valve regurgitation is not visualized. No aortic stenosis is present. Aortic valve mean gradient measures 3.0 mmHg. Aortic valve peak gradient measures 5.2 mmHg. Aortic valve area, by VTI measures 2.70 cm. Pulmonic Valve: The pulmonic valve was normal in structure. Pulmonic valve regurgitation is not visualized. No evidence of pulmonic stenosis. Aorta: The aortic root is normal in size and structure. IAS/Shunts: The interatrial septum was not assessed.  LEFT VENTRICLE PLAX 2D LVIDd:         4.90 cm      Diastology LVIDs:         3.20 cm      LV e' medial:    5.98 cm/s LV PW:         1.10 cm      LV E/e' medial:  12.7 LV IVS:        1.20 cm      LV e' lateral:   9.79 cm/s LVOT diam:     2.00 cm      LV E/e' lateral: 7.8 LV SV:         56 LV  SV Index:   24 LVOT Area:     3.14 cm  LV Volumes (MOD) LV vol d, MOD A2C: 83.8 ml LV vol d, MOD A4C: 119.0 ml LV vol s, MOD A2C: 46.1 ml LV vol s, MOD A4C: 68.4 ml LV SV MOD A2C:     37.7 ml LV SV MOD A4C:     119.0 ml LV SV MOD BP:      48.3 ml RIGHT VENTRICLE RV Basal diam:  3.60 cm RV Mid diam:    3.90 cm RV S prime:     10.90 cm/s TAPSE (M-mode): 1.8 cm LEFT ATRIUM             Index        RIGHT ATRIUM          Index LA diam:        3.20 cm 1.39 cm/m   RA Area:     8.52 cm LA Vol (A2C):   27.7 ml 12.07 ml/m  RA Volume:   17.10 ml 7.45 ml/m LA Vol (A4C):   29.7 ml 12.95 ml/m LA Biplane Vol: 31.2 ml 13.60 ml/m  AORTIC VALVE AV Area (Vmax):    2.84 cm AV Area (Vmean):   2.66 cm AV Area (VTI):     2.70 cm AV Vmax:           114.00 cm/s AV Vmean:          79.600 cm/s AV VTI:            0.206 m AV Peak Grad:      5.2 mmHg AV Mean Grad:      3.0 mmHg LVOT Vmax:         103.00 cm/s LVOT Vmean:        67.400 cm/s LVOT VTI:          0.177 m LVOT/AV VTI ratio: 0.86  AORTA  Ao Root diam: 2.90 cm Ao Asc diam:  2.90 cm MITRAL VALVE MV Area (PHT): 3.37 cm    SHUNTS MV Decel Time: 225 msec    Systemic VTI:  0.18 m MV E velocity: 76.20 cm/s  Systemic Diam: 2.00 cm MV A velocity: 64.10 cm/s MV E/A ratio:  1.19 Manish Patwardhan MD Electronically signed by Vernell Leep MD Signature Date/Time: 03/21/2022/2:08:19 PM    Final       Discharge Instructions     Amb Referral to Cardiac Rehabilitation   Complete by: As directed    Diagnosis: Coronary Stents   After initial evaluation and assessments completed: Virtual Based Care may be provided alone or in conjunction with Phase 2 Cardiac Rehab based on patient barriers.: Yes   Intensive Cardiac Rehabilitation (ICR) [place] location only OR Traditional Cardiac Rehabilitation (TCR) *If criteria for ICR are not met will enroll in TCR [facility] only): Yes       Discharge Medications: Allergies as of 03/31/2022       Reactions   Penicillins Shortness Of Breath,  Swelling, Other (See Comments)   Childhood reaction   Sulfa Antibiotics Shortness Of Breath, Swelling, Other (See Comments)   Childhood reaction        Medication List     STOP taking these medications    amLODipine 5 MG tablet Commonly known as: NORVASC       TAKE these medications    acetaminophen 500 MG tablet Commonly known as: TYLENOL Take 1-2 tablets (500-1,000 mg total) by mouth every 6 (six) hours.   allopurinol 100 MG tablet Commonly known as: ZYLOPRIM Take 1 tablet (100 mg total) by mouth daily.   aspirin EC 81 MG tablet Take 1 tablet (81 mg total) by mouth daily. Swallow whole.   bismuth subsalicylate 993 MG chewable tablet Commonly known as: PEPTO BISMOL Chew 524 mg by mouth 2 (two) times daily as needed for indigestion.   cetirizine 10 MG tablet Commonly known as: ZYRTEC Take 10 mg by mouth as needed.   metoprolol tartrate 50 MG tablet Commonly known as: LOPRESSOR Take 1 tablet (50 mg total) by mouth 2 (two) times daily.   oxyCODONE 5 MG immediate release tablet Commonly known as: Oxy IR/ROXICODONE Take 1 tablet (5 mg total) by mouth every 4 (four) hours as needed for severe pain.   rizatriptan 10 MG disintegrating tablet Commonly known as: MAXALT-MLT Dissolve 1 tablet (10 mg total) by mouth as needed for migraine. May repeat in 2 hours if needed   rosuvastatin 20 MG tablet Commonly known as: CRESTOR Take 1 tablet (20 mg total) by mouth daily. What changed:  medication strength how much to take   TART CHERRY PO Take 1 tablet by mouth daily.   ticagrelor 90 MG Tabs tablet Commonly known as: BRILINTA Take 1 tablet (90 mg total) by mouth 2 (two) times daily.               Durable Medical Equipment  (From admission, onward)           Start     Ordered   03/31/22 1232  For home use only DME Walker rolling  Once       Question Answer Comment  Walker: With 5 Inch Wheels   Patient needs a walker to treat with the following  condition Weakness generalized      03/31/22 1231           The patient has been discharged on:   1.Beta Blocker:  Yes [ X  ]                              No   [   ]                              If No, reason:  2.Ace Inhibitor/ARB: Yes [   ]                                     No  [  X  ]                                     If No, reason: labile BP  3.Statin:   Yes [  X ]                  No  [   ]                  If No, reason:  4.Ecasa:  Yes  [ X  ]                  No   [   ]                  If No, reason:  Patient had ACS upon admission: Yes  Plavix/P2Y12 inhibitor: Yes [x   ]                                      No  [   ]  Follow Up Appointments:  Follow-up Information     Melrose Nakayama, MD Follow up on 04/28/2022.   Specialty: Cardiothoracic Surgery Why: Appoinment is at 12:45, please get CXR at 12:15 at [facility] located on first of our office building and our office is on the 4th floor Contact information: [place] [address] [address], [place], NP. Go on 04/13/2022.   Why: Appointment time is 11:00 am. Please arrive at 10:30 am for paperwork Contact information: [place] [place] [address] [phone]                 Signed: Ellamae Sia 03/31/2022, 12:34 PM

## 2022-03-27 NOTE — Op Note (Signed)
Operative Note  Preoperative diagnosis:  1.  Urethral stricture  Postoperative diagnosis: 1.  Same  Procedure(s): 1.  Cystoscopy 2.  Urethral dilation 3.  Foley catheter placement over a wire  Surgeon: Rexene Alberts, MD  Assistants:  None  Anesthesia:  General  Complications:  None  EBL: Minimal  Specimens: 1.  None  Drains/Catheters: 1.  57 [place] council Foley catheter  Intraoperative findings:   Pinpoint bulbar urethral stricture that was soft and easily dilated to 22 [place].  Indication:  Michael Hart is a 36 y.o. male with a history of CAD here for CABG.  He does have a history of a known urethral stricture and at one point was offered urethral dilation however patient declined.  Intraoperatively, nursing staff was unable to place Foley catheter and urology consult was requested to place catheter.  Description of procedure: Under sterile conditions, the penis was prepped and draped.  I inserted a 55 French flexible ureteroscope and advanced this to the bulbar urethra.  There, there was some edema inflammation from prior Foley catheter attempts.  I was able to identify a pinpoint area and I passed a wire through this area and this passed easily presumably, into the bladder.  Over this wire, I then passed a 5 [place] open-ended catheter into the bladder and remove the wire.  There was drainage of clear yellow urine.  I then replaced a 0.038 sensor wire and dilated his urethra sequentially from 8 [place] to 22 [place].  This dilated easily.  I then placed a 16 [place] council catheter over the wire with return of clear yellow urine.  Patient tolerated the procedure well.  Plan: Leave Foley catheter in place for 3 days.  Void trial per primary team.  I will arrange follow-up with Dr. Junious Silk outpatient.  [facility]  Pager: [phone]

## 2022-03-27 NOTE — Anesthesia Procedure Notes (Signed)
Arterial Line Insertion Start/End10/11/2021 7:00 AM, 03/27/2022 7:05 AM Performed by: CRNA  Patient location: Pre-op. Preanesthetic checklist: patient identified, IV checked, site marked, risks and benefits discussed, surgical consent, monitors and equipment checked, pre-op evaluation, timeout performed and anesthesia consent Lidocaine 1% used for infiltration Right, radial was placed Catheter size: 20 G Hand hygiene performed  and maximum sterile barriers used  Allen's test indicative of satisfactory collateral circulation Attempts: 1 Procedure performed without using ultrasound guided technique. Following insertion, dressing applied and Biopatch. Post procedure assessment: normal  Patient tolerated the procedure well with no immediate complications.

## 2022-03-27 NOTE — Progress Notes (Signed)
Echocardiogram Echocardiogram Transesophageal has been performed.  Michael Hart Michael Hart 03/27/2022, 8:36 AM 

## 2022-03-27 NOTE — Procedures (Signed)
Extubation Procedure Note  Patient Details:   Name: BRONSEN SERANO DOB: 07/25/1985 MRN: [id number]   Airway Documentation:    Vent end date: (not recorded) Vent end time: (not recorded)   Evaluation  O2 sats: stable throughout Complications: No apparent complications Patient did tolerate procedure well. Bilateral Breath Sounds: Diminished, Clear   Yes, patient able to speak.   Patient extubated at 1728 per rapid wean protocol. NIF -20, VC 1.1L. Patient tolerated well. Placed on 4L nasal cannula. RN at bedside.  Seward Speck 03/27/2022, 5:36 PM

## 2022-03-27 NOTE — Op Note (Signed)
NAME: Michael Hart, Michael Hart MEDICAL RECORD NO: [id number] ACCOUNT NO: [id number] DATE OF BIRTH: 1985/07/21 FACILITY: [facility] LOCATION: [facility] PHYSICIAN: Salvatore Decent. Dorris Fetch, MD  Operative Report   DATE OF PROCEDURE: 03/27/2022  PREOPERATIVE DIAGNOSIS:  Severe 2-vessel coronary artery disease, status post myocardial infarction.  POSTOPERATIVE DIAGNOSIS:  Severe 2-vessel coronary artery disease, status post myocardial infarction.  PROCEDURES PERFORMED:   Median sternotomy, extracorporeal circulation, Coronary artery bypass grafting x 2  Left internal mammary artery to ramus intermedius  Free right internal mammary artery to distal right coronary.  SURGEON:  Salvatore Decent. Dorris Fetch, MD  ASSISTANT:  Doree Fudge, PA  ANESTHESIA:  General.  FINDINGS:  Transesophageal echocardiography showed ejection fraction of approximately 45% with anterior septal hypokinesis.  No significant valvular pathology.  No change post-bypass. Left radial artery small, not suitable for use as a bypass graft.   Mammary arteries relatively small for the patient's size, but good quality vessels.  Ramus intermedius good quality target. Distal right, small, poor quality target, diffusely diseased. Branches of the distal right, not suitable for grafting.  OM1 and 2  are too small to graft.  CLINICAL NOTE:  Michael Hart is a 36 year old gentleman who presented with an ST elevation MI.  He underwent emergent catheterization where he was found to have severe 3-vessel disease.  He had angioplasty and stenting of his LAD, which aborted the MI,  but he had residual severe 2-vessel coronary artery disease.  He was discussed with the heart team meeting and bypass grafting was felt to be the best option for treatment.  He did understand that the OM1 and 2 branches were small and may or may not be  suitable for grafting.  The indications, risks, benefits, and alternatives were discussed in detail with the patient.  He understood  and accepted the risks and agreed to proceed.  OPERATIVE NOTE:  Michael Hart was brought to the preoperative holding area on 03/27/2022.  Anesthesia placed a Swan-Ganz catheter and an arterial blood pressure monitoring line.  He was taken to the operating room, anesthetized and intubated.  Dr. Jairo Ben performed transesophageal echocardiography.  Please refer to her separately dictated note for full details.  Findings were as noted above.  An attempt was made by the nursing staff to place a Foley catheter, unsuccessfully.  Urology was  consulted, Dr. Cardell Peach ultimately was able to perform a dilatation and placement of Foley catheter over a wire.  The chest, abdomen, legs and left arm then were prepped and draped in the usual sterile fashion.  Intravenous antibiotics were administered.  A timeout was performed.  A median sternotomy was performed and the left internal mammary artery was harvested using standard technique.  Simultaneously, incision was made in the volar aspect of the left wrist over the radial artery.  A short incision 5  or 6 cm in length was made initially.  A short segment of the radial artery was dissected out.  The artery was noted to be very small.  There was poor pulse oximetry signal in the thumb even without clamping the artery and papaverine was applied.  Even  after that, the artery remained very small and was not suitable for use for the bypass graft.  The incision was irrigated and then closed in standard fashion.  The left internal mammary artery harvesting was finished and the right internal mammary artery  was harvested using standard technique.  This likewise was a relatively small vessel for the patient's size, but did have excellent flow  when divided distally.  The proximal end of the radial artery was clamped and divided for use as a free graft and  the proximal stump was suture ligated with a 2-0 silk suture.  The remainder of full heparin dose was given.  A sternal  retractor was placed and was opened over time.  The pericardium was opened.  There was a small pericardial effusion.  The aorta was relatively small.  There was some cardiomegaly.  After confirming  adequate anticoagulation with ACT measurement, the aorta was cannulated via concentric 2-0 Ethibond pledgeted pursestring sutures.  A dual stage venous cannula was placed via a pursestring suture in the right atrial appendage.  Cardiopulmonary bypass  was initiated.  Flows were maintained per protocol.  The patient was cooled to 32 degrees Celsius.  The coronary arteries were inspected and anastomotic sites were chosen.  The conduits were inspected and cut to length.  Inspecting the OM1 and 2 branches  of the circumflex, those vessels were clearly far too small to consider bypassing.  Inspecting the distal right coronary circulation, the branches that arose from it were also too small to graft.  There was significant plaque in the distal right  coronary, but there was an area that did appear we could bypass into distally around the takeoff of the posterior descending and posterolateral branches.  A foam pad was placed in the pericardium to insulate the heart.  A temperature probe was placed in  the myocardial septum and a cardioplegia cannula was placed in the ascending aorta.  The aorta was crossclamped.  The left ventricle was emptied via the aortic root vent.  Cardiac arrest then was achieved with a combination of cold antegrade blood cardioplegia and topical iced saline.  One liter of cardioplegia was administered.  There  was a rapid diastolic arrest and septal cooling to 11 degrees Celsius.  The distal end of the right mammary then was bevelled.  It was anastomosed end-to-side to the distal right coronary.  This was a 1.5 mm poor quality target. 1 mm probe did pass into  the origin of the PD and PL branches.  The end-to-side anastomosis was performed with a running 8-0 Prolene suture.  At the completion of  the anastomosis, additional cardioplegia was administered via the aortic root and there was good backbleeding from  the right mammary.  The left internal mammary artery then was brought through a window in the pericardium.  The distal end was bevelled.  It was anastomosed end-to-side to the ramus intermedius.  This was the dominant lateral branch.  It was a 2 mm good quality target.  It was superficially intramyocardial.  The mammary was a 1.5 mm good quality conduit.  An end-to-side anastomosis was performed with a running 8-0 Prolene suture.  After completion of the mammary to ramus anastomosis, the bulldog clamp  was briefly removed to inspect for hemostasis, which was good.  The bulldog clamp was replaced and the mammary pedicle was tacked to the epicardial surface of the heart with 6-0 Prolene sutures.  Additional cardioplegia was administered.  The aortotomy was made for the right mammary proximal with a 4.0 mm punch aortotomy. The proximal end of the right mammary was bevelled.  It was anastomosed end-to-side to the aorta with a running 7-0 Prolene  suture.  After completion of the mammary to aorta anastomosis, the patient was placed in Trendelenburg position.  The left ventricle was emptied via the aortic root vent.  After de-airing the heart and  aortic root, the aortic crossclamp was removed.   Total crossclamp time was 68 minutes.  The patient spontaneously resumed sinus rhythm and did not require defibrillation.  While rewarming was completed, all proximal and distal anastomoses were inspected for hemostasis.  Epicardial pacing wires were placed on the right ventricle and right atrium.  When the patient had rewarmed to a core temperature of 37 degrees Celsius, he  was weaned from cardiopulmonary bypass on the first attempt.  He was in sinus rhythm and on no inotropic support at the time of separation from bypass.  The total bypass time was 126 minutes.  The initial cardiac index was greater than  2 liters per  minute per meter squared and he remained hemodynamically stable throughout the post-bypass period.  The post-bypass transesophageal echocardiography was performed again by Dr. Glennon Mac and findings were similar to the preoperative study.  A test dose of protamine was administered and was well tolerated.  The atrial and aortic cannulae were removed.  The  remainder of the protamine was administered without incident.  The chest was irrigated with warm saline.  Hemostasis was achieved.  Bilateral pleural tubes and a 36-French mediastinal drain were placed through separate subcostal incisions and secured  with #1 silk sutures.  The sternum was closed with a combination of single and double heavy gauge stainless steel wires.  Pectoralis fascia, subcutaneous tissue and skin were closed in standard fashion.  All sponge, needle and instrument counts were  correct at the end of the procedure. The patient was taken from the operating room to the surgical intensive care unit, intubated and in good condition.  Experienced assistance was necessary for this case due to surgical complexity.  Lars Pinks assisted with exploration on the left radial artery and wound closure, exposure, retraction of delicate tissues, suctioning and suture management during  the anastomoses.   [facility] D: 03/27/2022 5:12:46 pm T: 03/27/2022 10:16:00 pm  JOB: [id number]/ [id number]

## 2022-03-27 NOTE — Anesthesia Procedure Notes (Signed)
Procedure Name: Intubation Date/Time: 03/27/2022 7:50 AM  Performed by: Lorie Phenix, CRNAPre-anesthesia Checklist: Patient identified, Emergency Drugs available, Suction available and Patient being monitored Patient Re-evaluated:Patient Re-evaluated prior to induction Oxygen Delivery Method: Circle system utilized Preoxygenation: Pre-oxygenation with 100% oxygen Induction Type: IV induction Ventilation: Mask ventilation without difficulty Laryngoscope Size: Mac and 4 Grade View: Grade I Tube type: Oral Tube size: 8.0 mm Number of attempts: 1 Airway Equipment and Method: Stylet Placement Confirmation: ETT inserted through vocal cords under direct vision, positive ETCO2 and breath sounds checked- equal and bilateral Secured at: 26 cm Tube secured with: Tape Dental Injury: Teeth and Oropharynx as per pre-operative assessment

## 2022-03-27 NOTE — Anesthesia Postprocedure Evaluation (Signed)
Anesthesia Post Note  Patient: Michael Hart  Procedure(s) Performed: CORONARY ARTERY BYPASS GRAFTING (CABG) x2 , USING BILATERAL INTERNAL MAMMARY ARTERIES (Chest) ATTEMPTED RADIAL ARTERY HARVEST (Left) TRANSESOPHAGEAL ECHOCARDIOGRAM (TEE) CYSTOSCOPY FLEXIBLE, PLACEMENT OF FOLEY CATHETER     Patient location during evaluation: SICU Anesthesia Type: General Level of consciousness: sedated, responds to stimulation and patient remains intubated per anesthesia plan Pain management: pain level controlled Vital Signs Assessment: post-procedure vital signs reviewed and stable Respiratory status: patient on ventilator - see flowsheet for VS and patient remains intubated per anesthesia plan (pt beginning to wean from vent) Cardiovascular status: stable (intermittent phenylephrine support) Postop Assessment: no apparent nausea or vomiting Anesthetic complications: no   No notable events documented.  Last Vitals:  Vitals:   03/27/22 1530 03/27/22 1545  BP: 97/71   Pulse:    Resp: (!) 0 15  Temp: 36.5 C 36.5 C  SpO2: 100% 100%    Last Pain:  Vitals:   03/27/22 0333  TempSrc: Oral  PainSc: 0-No pain                 [name],E. Sienna Stonehocker

## 2022-03-27 NOTE — Transfer of Care (Signed)
Immediate Anesthesia Transfer of Care Note  Patient: Michael Hart  Procedure(s) Performed: CORONARY ARTERY BYPASS GRAFTING (CABG) x2 , USING BILATERAL INTERNAL MAMMARY ARTERIES (Chest) ATTEMPTED RADIAL ARTERY HARVEST (Left) TRANSESOPHAGEAL ECHOCARDIOGRAM (TEE) CYSTOSCOPY FLEXIBLE, PLACEMENT OF FOLEY CATHETER  Patient Location: SICU  Anesthesia Type:General  Level of Consciousness: Patient remains intubated per anesthesia plan  Airway & Oxygen Therapy: Patient remains intubated per anesthesia plan  Post-op Assessment: Report given to RN and Post -op Vital signs reviewed and stable  Post vital signs: Reviewed and stable  Last Vitals:  Vitals Value Taken Time  BP 116/68 03/27/22 1506  Temp    Pulse 86 03/27/22 1506  Resp 12 03/27/22 1506  SpO2 100 % 03/27/22 1506    Last Pain:  Vitals:   03/27/22 0333  TempSrc: Oral  PainSc: 0-No pain      Patients Stated Pain Goal: 0 (39/76/73 [id number])  Complications: No notable events documented.

## 2022-03-27 NOTE — Interval H&P Note (Signed)
History and Physical Interval Note:  03/27/2022 7:15 AM  Michael Hart  has presented today for surgery, with the diagnosis of CAD.  The various methods of treatment have been discussed with the patient and family. After consideration of risks, benefits and other options for treatment, the patient has consented to  Procedure(s): CORONARY ARTERY BYPASS GRAFTING (CABG) (N/A) RADIAL ARTERY HARVEST (Left) TRANSESOPHAGEAL ECHOCARDIOGRAM (TEE) (N/A) as a surgical intervention.  The patient's history has been reviewed, patient examined, no change in status, stable for surgery.  I have reviewed the patient's chart and labs.  Questions were answered to the patient's satisfaction.     Melrose Nakayama

## 2022-03-27 NOTE — Anesthesia Procedure Notes (Signed)
Central Venous Catheter Insertion Performed by: Annye Asa, MD, anesthesiologist Start/End10/11/2021 6:37 AM, 03/27/2022 6:58 AM Patient location: Pre-op. Preanesthetic checklist: patient identified, IV checked, risks and benefits discussed, surgical consent, monitors and equipment checked, pre-op evaluation, timeout performed and anesthesia consent Position: Trendelenburg Lidocaine 1% used for infiltration and patient sedated Hand hygiene performed , maximum sterile barriers used  and Seldinger technique used Catheter size: 8.5 Fr PA cath was placed.Sheath introducer Swan type:thermodilution Procedure performed using ultrasound guided technique. Ultrasound Notes:anatomy identified, needle tip was noted to be adjacent to the nerve/plexus identified, no ultrasound evidence of intravascular and/or intraneural injection and image(s) printed for medical record Attempts: 1 Following insertion, line sutured, dressing applied and Biopatch. Post procedure assessment: blood return through all ports, free fluid flow and no air  Patient tolerated the procedure well with no immediate complications. Additional procedure comments: PA catheter:  Routine monitors. Timeout, sterile prep, drape, FBP R neck.  Trendelenburg position.  1% Lido local, finder and trocar RIJ 1st pass with US guidance.  Cordis placed over J wire. PA catheter in easily.  Sterile dressing applied.  Patient tolerated well, VSS.  Jenita Seashore, MD.

## 2022-03-27 NOTE — Brief Op Note (Addendum)
03/21/2022 - 03/27/2022  10:22 AM  PATIENT:  Michael Hart  36 y.o. male  PRE-OPERATIVE DIAGNOSIS:  1. S/p STEMI 2. CAD 3. URETHRAL STRICTURE  POST-OPERATIVE DIAGNOSIS: 1. S/p STEMI 2. CAD 3. URETHRAL STRICTURE  PROCEDURE:  CYSTOSCOPY FLEXIBLE, PLACEMENT OF FOLEY CATHETER, TRANSESOPHAGEAL ECHOCARDIOGRAM (TEE), CORONARY ARTERY BYPASS GRAFTING (CABG) x 2 (LIMA to RAMUS INTERMEDIATE and FREE RIMA to DISTAL RCA) with BILATERAL INTERNAL MAMMARY ARTERY HARVEST  Findings: LEFT RADIAL ARTERY small, not suitable for use as a bypass graft SURGEON:  Surgeon(s) and Role: Panel 1:    Melrose Nakayama, MD - Primary Panel 2:    Janith Lima, MD - Primary  PHYSICIAN ASSISTANT: Lars Pinks PA-C  ASSISTANTS: Dineen Kid RNFA   ANESTHESIA:   general  EBL:  Per perfusion, anesthesia record  DRAINS:  Chest tubes placed in the mediastinal and pleural spaces    COUNTS CORRECT:  YES  DICTATION: .Dragon Dictation  PLAN OF CARE: Admit to inpatient   PATIENT DISPOSITION:  ICU - extubated and stable.   Delay start of Pharmacological VTE agent (>24hrs) due to surgical blood loss or risk of bleeding: no   BASELINE WEIGHT: 106.6 kg

## 2022-03-28 ENCOUNTER — Inpatient Hospital Stay (HOSPITAL_COMMUNITY): Payer: 59

## 2022-03-28 DIAGNOSIS — I5020 Unspecified systolic (congestive) heart failure: Secondary | ICD-10-CM

## 2022-03-28 DIAGNOSIS — Z951 Presence of aortocoronary bypass graft: Secondary | ICD-10-CM

## 2022-03-28 LAB — CBC
HCT: 28.4 % — ABNORMAL LOW (ref 39.0–52.0)
HCT: 32 % — ABNORMAL LOW (ref 39.0–52.0)
Hemoglobin: 10 g/dL — ABNORMAL LOW (ref 13.0–17.0)
Hemoglobin: 10.9 g/dL — ABNORMAL LOW (ref 13.0–17.0)
MCH: 31.3 pg (ref 26.0–34.0)
MCH: 30.7 pg (ref 26.0–34.0)
MCHC: 35.2 g/dL (ref 30.0–36.0)
MCHC: 34.1 g/dL (ref 30.0–36.0)
MCV: 89 fL (ref 80.0–100.0)
MCV: 90.1 fL (ref 80.0–100.0)
Platelets: 232 10*3/uL (ref 150–400)
Platelets: 289 10*3/uL (ref 150–400)
RBC: 3.19 MIL/uL — ABNORMAL LOW (ref 4.22–5.81)
RBC: 3.55 MIL/uL — ABNORMAL LOW (ref 4.22–5.81)
RDW: 11.6 % (ref 11.5–15.5)
RDW: 11.7 % (ref 11.5–15.5)
WBC: 10.2 10*3/uL (ref 4.0–10.5)
WBC: 14.4 10*3/uL — ABNORMAL HIGH (ref 4.0–10.5)
nRBC: 0 % (ref 0.0–0.2)
nRBC: 0 % (ref 0.0–0.2)

## 2022-03-28 LAB — GLUCOSE, CAPILLARY
Glucose-Capillary: 107 mg/dL — ABNORMAL HIGH (ref 70–99)
Glucose-Capillary: 112 mg/dL — ABNORMAL HIGH (ref 70–99)
Glucose-Capillary: 114 mg/dL — ABNORMAL HIGH (ref 70–99)
Glucose-Capillary: 117 mg/dL — ABNORMAL HIGH (ref 70–99)
Glucose-Capillary: 124 mg/dL — ABNORMAL HIGH (ref 70–99)
Glucose-Capillary: 126 mg/dL — ABNORMAL HIGH (ref 70–99)
Glucose-Capillary: 129 mg/dL — ABNORMAL HIGH (ref 70–99)
Glucose-Capillary: 131 mg/dL — ABNORMAL HIGH (ref 70–99)
Glucose-Capillary: 134 mg/dL — ABNORMAL HIGH (ref 70–99)
Glucose-Capillary: 147 mg/dL — ABNORMAL HIGH (ref 70–99)

## 2022-03-28 LAB — BASIC METABOLIC PANEL
Anion gap: 9 (ref 5–15)
Anion gap: 8 (ref 5–15)
BUN: 13 mg/dL (ref 6–20)
BUN: 14 mg/dL (ref 6–20)
CO2: 22 mmol/L (ref 22–32)
CO2: 24 mmol/L (ref 22–32)
Calcium: 8.1 mg/dL — ABNORMAL LOW (ref 8.9–10.3)
Calcium: 8.3 mg/dL — ABNORMAL LOW (ref 8.9–10.3)
Chloride: 105 mmol/L (ref 98–111)
Chloride: 101 mmol/L (ref 98–111)
Creatinine, Ser: 0.9 mg/dL (ref 0.61–1.24)
Creatinine, Ser: 0.94 mg/dL (ref 0.61–1.24)
GFR, Estimated: >60 mL/min (ref >60)
GFR, Estimated: >60 mL/min (ref >60)
Glucose, Bld: 112 mg/dL — ABNORMAL HIGH (ref 70–99)
Glucose, Bld: 190 mg/dL — ABNORMAL HIGH (ref 70–99)
Potassium: 4.5 mmol/L (ref 3.5–5.1)
Potassium: 4.3 mmol/L (ref 3.5–5.1)
Sodium: 136 mmol/L (ref 135–145)
Sodium: 133 mmol/L — ABNORMAL LOW (ref 135–145)

## 2022-03-28 LAB — MAGNESIUM
Magnesium: 2.8 mg/dL — ABNORMAL HIGH (ref 1.7–2.4)
Magnesium: 2.4 mg/dL (ref 1.7–2.4)

## 2022-03-28 MED ORDER — INSULIN DETEMIR 100 UNIT/ML ~~LOC~~ SOLN
10.0000 [IU] | Freq: Once | SUBCUTANEOUS | Status: AC
  Administered 2022-03-28: 10 [IU] via SUBCUTANEOUS
  Filled 2022-03-28: qty 0.1

## 2022-03-28 MED ORDER — ASPIRIN 81 MG PO TBEC
81.0000 mg | DELAYED_RELEASE_TABLET | Freq: Every day | ORAL | Status: DC
  Administered 2022-03-29 – 2022-03-31 (×3): 81 mg via ORAL
  Filled 2022-03-28 (×3): qty 1

## 2022-03-28 MED ORDER — ENOXAPARIN SODIUM 40 MG/0.4ML IJ SOSY
40.0000 mg | PREFILLED_SYRINGE | Freq: Every day | INTRAMUSCULAR | Status: DC
  Administered 2022-03-28 – 2022-03-29 (×2): 40 mg via SUBCUTANEOUS
  Filled 2022-03-28 (×2): qty 0.4

## 2022-03-28 MED ORDER — TICAGRELOR 60 MG PO TABS: 60.0000 mg | ORAL_TABLET | Freq: Two times a day (BID) | ORAL | Status: DC

## 2022-03-28 MED ORDER — TICAGRELOR 90 MG PO TABS
90.0000 mg | ORAL_TABLET | Freq: Two times a day (BID) | ORAL | Status: DC
  Administered 2022-03-28 – 2022-03-31 (×7): 90 mg via ORAL
  Filled 2022-03-28 (×7): qty 1

## 2022-03-28 MED ORDER — INSULIN DETEMIR 100 UNIT/ML ~~LOC~~ SOLN
10.0000 [IU] | Freq: Every day | SUBCUTANEOUS | Status: DC
  Administered 2022-03-29: 10 [IU] via SUBCUTANEOUS
  Filled 2022-03-28 (×2): qty 0.1

## 2022-03-28 MED ORDER — INSULIN ASPART 100 UNIT/ML IJ SOLN
0.0000 [IU] | INTRAMUSCULAR | Status: DC
  Administered 2022-03-28 – 2022-03-29 (×6): 2 [IU] via SUBCUTANEOUS

## 2022-03-28 MED ORDER — ASPIRIN 81 MG PO CHEW
81.0000 mg | CHEWABLE_TABLET | Freq: Every day | ORAL | Status: DC
  Filled 2022-03-28: qty 1

## 2022-03-28 NOTE — Progress Notes (Signed)
EVENING ROUNDS NOTE :     [address]       [place] [address]             [phone]                 1 Day Post-Op Procedure(s) (LRB): CORONARY ARTERY BYPASS GRAFTING (CABG) x2 , USING BILATERAL INTERNAL MAMMARY ARTERIES (N/A) ATTEMPTED RADIAL ARTERY HARVEST (Left) TRANSESOPHAGEAL ECHOCARDIOGRAM (TEE) (N/A) CYSTOSCOPY FLEXIBLE, PLACEMENT OF FOLEY CATHETER (N/A)   Total Length of Stay:  LOS: 7 days  Events:   No events    BP 121/73   Pulse 83   Temp 99 F (37.2 C)   Resp 11   Ht 5\' 9"  (1.753 m)   Wt 113.2 kg   SpO2 94%   BMI 36.86 kg/m   PAP: (31-46)/(17-25) 38/17 CO:  [4.3 L/min-5.4 L/min] 5.4 L/min CI:  [2 L/min/m2-2.4 L/min/m2] 2.4 L/min/m2      sodium chloride 20 mL/hr at 03/28/22 0800   sodium chloride     sodium chloride 10 mL/hr at 03/28/22 0800   insulin 1.1 Units/hr (03/28/22 0800)   lactated ringers     lactated ringers     lactated ringers 20 mL/hr at 03/28/22 0800   nitroGLYCERIN Stopped (03/27/22 1616)   phenylephrine (NEO-SYNEPHRINE) Adult infusion Stopped (03/28/22 0625)    I/O last 3 completed shifts: In: 4846.9 [I.V.:3581; Blood:306; IV Piggyback:959.9] Out: 8416 [Urine:3520; Blood:496; Chest Tube:680]      Latest Ref Rng & Units 03/28/2022    6:01 PM 03/28/2022    4:53 AM 03/27/2022    9:13 PM  CBC  WBC 4.0 - 10.5 K/uL 14.4  10.2  11.6   Hemoglobin 13.0 - 17.0 g/dL 10.9  10.0  10.6   Hematocrit 39.0 - 52.0 % 32.0  28.4  29.8   Platelets 150 - 400 K/uL 289  232  254        Latest Ref Rng & Units 03/28/2022    6:01 PM 03/28/2022    4:53 AM 03/27/2022    9:13 PM  BMP  Glucose 70 - 99 mg/dL 190  112  163   BUN 6 - 20 mg/dL 14  13  15    Creatinine 0.61 - 1.24 mg/dL 0.94  0.90  0.93   Sodium 135 - 145 mmol/L 133  136  136   Potassium 3.5 - 5.1 mmol/L 4.3  4.5  4.7   Chloride 98 - 111 mmol/L 101  105  107   CO2 22 - 32 mmol/L 24  22  21    Calcium 8.9 - 10.3 mg/dL 8.3  8.1  7.7     ABG    Component Value  Date/Time   PHART 7.356 03/27/2022 1850   PCO2ART 36.3 03/27/2022 1850   PO2ART 135 (H) 03/27/2022 1850   HCO3 20.3 03/27/2022 1850   TCO2 21 (L) 03/27/2022 1850   ACIDBASEDEF 5.0 (H) 03/27/2022 1850   O2SAT 99 03/27/2022 [place], MD 03/28/2022 7:32 PM

## 2022-03-28 NOTE — Progress Notes (Signed)
      [address]       [place] [address]             [phone]                 1 Day Post-Op Procedure(s) (LRB): CORONARY ARTERY BYPASS GRAFTING (CABG) x2 , USING BILATERAL INTERNAL MAMMARY ARTERIES (N/A) ATTEMPTED RADIAL ARTERY HARVEST (Left) TRANSESOPHAGEAL ECHOCARDIOGRAM (TEE) (N/A) CYSTOSCOPY FLEXIBLE, PLACEMENT OF FOLEY CATHETER (N/A)   Events: No events _______________________________________________________________ Vitals: BP 107/73 (BP Location: Left Arm)   Pulse 83   Temp 98.8 F (37.1 C) (Oral)   Resp 17   Ht 5\' 9"  (1.753 m)   Wt 113.2 kg   SpO2 100%   BMI 36.86 kg/m  Filed Weights   03/21/22 0400 03/27/22 0519 03/28/22 0615  Weight: 113.3 kg 106.6 kg 113.2 kg     - Neuro: alert NAD  - Cardiovascular: sinus  Drips: none.   PAP: (21-46)/(12-25) 40/21 CO:  [3.3 L/min-5.4 L/min] 5.4 L/min CI:  [1.5 L/min/m2-2.4 L/min/m2] 2.4 L/min/m2  - Pulm: EWOB  ABG    Component Value Date/Time   PHART 7.356 03/27/2022 1850   PCO2ART 36.3 03/27/2022 1850   PO2ART 135 (H) 03/27/2022 1850   HCO3 20.3 03/27/2022 1850   TCO2 21 (L) 03/27/2022 1850   ACIDBASEDEF 5.0 (H) 03/27/2022 1850   O2SAT 99 03/27/2022 1850    - Abd: ND - Extremity: trace edema  .Intake/Output      10/06 0701 10/07 0700 10/07 0701 10/08 0700   P.O.     I.V. (mL/kg) 3466.4 (30.6) 114.6 (1)   Blood 306    IV Piggyback 959.9    Total Intake(mL/kg) 4732.3 (41.8) 114.6 (1)   Urine (mL/kg/hr) 2720 (1) 100 (0.7)   Blood 496    Chest Tube 430    Total Output 3646 100   Net +1086.3 +14.6           _______________________________________________________________ Labs:    Latest Ref Rng & Units 03/28/2022    4:53 AM 03/27/2022    9:13 PM 03/27/2022    6:50 PM  CBC  WBC 4.0 - 10.5 K/uL 10.2  11.6    Hemoglobin 13.0 - 17.0 g/dL 10.0  10.6  8.8   Hematocrit 39.0 - 52.0 % 28.4  29.8  26.0   Platelets 150 - 400 K/uL 232  254        Latest Ref Rng & Units 03/28/2022     4:53 AM 03/27/2022    9:13 PM 03/27/2022    6:50 PM  CMP  Glucose 70 - 99 mg/dL 112  163    BUN 6 - 20 mg/dL 13  15    Creatinine 0.61 - 1.24 mg/dL 0.90  0.93    Sodium 135 - 145 mmol/L 136  136  137   Potassium 3.5 - 5.1 mmol/L 4.5  4.7  4.8   Chloride 98 - 111 mmol/L 105  107    CO2 22 - 32 mmol/L 22  21    Calcium 8.9 - 10.3 mg/dL 8.1  7.7      CXR: PV congestion  _______________________________________________________________  Assessment and Plan: POD 1 s/p CABG with BIMA  Neuro: pain controlled CV: will remove swan, A line, and pacing wires. Pulm: IS ambulation Renal: creat stable GI: on diet Heme: starting brilinta today ID: [email or web address] Endo: SSI Dispo: continue ICU care   Lajuana Matte 03/28/2022 8:17 AM

## 2022-03-28 NOTE — Progress Notes (Addendum)
Progress Note  Patient Name: Michael Hart Date of Encounter: 03/28/2022  Attending physician: Melrose Nakayama, MD Primary care provider: Girtha Rm, NP-C Primary Cardiologist: Dr. Vernell Leep  Subjective: Michael Hart is a 36 y.o. male sitting up in chair. Substernal chest discomfort -tender to touch at the incision site Swan, A-line and pacing wires removed Case discussed and reviewed with his nurse.  Objective: Vital Signs in the last 24 hours: Temp:  [98.4 F (36.9 C)-99 F (37.2 C)] 99 F (37.2 C) (10/07 1000) Resp:  [13-31] 13 (10/07 1600) BP: (82-136)/(51-87) 113/66 (10/07 1600) SpO2:  [83 %-100 %] 95 % (10/07 1600) Arterial Line BP: (93-136)/(57-72) 126/62 (10/07 1000) Weight:  [113.2 kg] 113.2 kg (10/07 0615)  Intake/Output:  Intake/Output Summary (Last 24 hours) at 03/28/2022 1718 Last data filed at 03/28/2022 1200 Gross per 24 hour  Intake 1586.04 ml  Output 1770 ml  Net -183.96 ml    Net IO Since Admission: -571.39 mL [03/28/22 1718]  Weights:  Filed Weights   03/21/22 0400 03/27/22 0519 03/28/22 0615  Weight: 113.3 kg 106.6 kg 113.2 kg    Telemetry: Personally reviewed.  Sinus without significant ectopy  Physical examination: PHYSICAL EXAM:    03/28/2022    4:00 PM 03/28/2022    3:00 PM 03/28/2022    2:00 PM  Vitals with BMI  Systolic 818 563 149  Diastolic 66 63 59    CONSTITUTIONAL: Well-developed and well-nourished. No acute distress.  SKIN: Skin is warm and dry. No rash noted. No cyanosis. No pallor. No jaundice HEAD: Normocephalic and atraumatic.  EYES: No scleral icterus MOUTH/THROAT: Moist oral membranes.  NECK: RIJ catheter present.  No thyromegaly noted. No carotid bruits  CHEST: Sternotomy site is well dressed, chest tube present, normal respiratory effort. No intercostal retractions  LUNGS: Clear to auscultation in the upper lung fields with decreased breath sounds at the bases.  No wheezing or rales  appreciated.   CARDIOVASCULAR: Regular, tachycardic, positive S1-S2, no murmurs rubs or gallops appreciated secondary to tachycardia.   ABDOMINAL: Soft, nontender, nondistended, positive bowel sounds in all 4 quadrants, no apparent ascites.  EXTREMITIES: No pitting edema, warm to touch, 2+ bilateral DP and PT pulses HEMATOLOGIC: No significant bruising NEUROLOGIC: Oriented to person, place, and time. Nonfocal. Normal muscle tone.  PSYCHIATRIC: Normal mood and affect. Normal behavior. Cooperative  Lab Results: Hematology Recent Labs  Lab 03/27/22 1504 03/27/22 1517 03/27/22 1850 03/27/22 2113 03/28/22 0453  WBC 12.8*  --   --  11.6* 10.2  RBC 3.50*  --   --  3.40* 3.19*  HGB 11.0*   < > 8.8* 10.6* 10.0*  HCT 30.7*   < > 26.0* 29.8* 28.4*  MCV 87.7  --   --  87.6 89.0  MCH 31.4  --   --  31.2 31.3  MCHC 35.8  --   --  35.6 35.2  RDW 11.4*  --   --  11.7 11.6  PLT 202  --   --  254 232   < > = values in this interval not displayed.    Chemistry Recent Labs  Lab 03/27/22 0536 03/27/22 0758 03/27/22 1401 03/27/22 1406 03/27/22 1850 03/27/22 2113 03/28/22 0453  NA 134*   < > 137   < > 137 136 136  K 3.5   < > 4.6   < > 4.8 4.7 4.5  CL 100   < > 103  --   --  107 105  CO2  22  --   --   --   --  21* 22  GLUCOSE 98   < > 145*  --   --  163* 112*  BUN 17   < > 18  --   --  15 13  CREATININE 1.10   < > 0.70  --   --  0.93 0.90  CALCIUM 9.1  --   --   --   --  7.7* 8.1*  GFRNONAA >60  --   --   --   --  >60 >60  ANIONGAP 12  --   --   --   --  8 9   < > = values in this interval not displayed.     Cardiac Enzymes: Cardiac Panel (last 3 results) No results for input(s): "CKTOTAL", "CKMB", "TROPONINIHS", "RELINDX" in the last 72 hours.  BNP (last 3 results) Recent Labs    03/21/22 1100 03/22/22 1124  BNP 10.5 226.2*    ProBNP (last 3 results) No results for input(s): "PROBNP" in the last 8760 hours.   DDimer No results for input(s): "DDIMER" in the last 168  hours.   Hemoglobin A1c:  Lab Results  Component Value Date   HGBA1C 5.2 03/21/2022   MPG 102.54 03/21/2022    TSH  Recent Labs    01/28/22 0924  TSH 1.81    Lipid Panel     Component Value Date/Time   CHOL 160 03/23/2022 1021   TRIG 201 (H) 03/23/2022 1021   HDL 38 (L) 03/23/2022 1021   CHOLHDL 4.2 03/23/2022 1021   VLDL 40 03/23/2022 1021   LDLCALC 82 03/23/2022 1021   LDLDIRECT 96 03/23/2022 1021    Imaging: DG Chest Port 1 View  Result Date: 03/28/2022 CLINICAL DATA:  Status post CABG EXAM: PORTABLE CHEST 1 VIEW COMPARISON:  03/27/2022 and prior studies FINDINGS: Cardiomediastinal silhouette is unchanged. CABG changes again noted. Endotracheal tube and enteric tube have been removed. Swan-Ganz catheter tip is now overlying the pulmonary outflow tract/main pulmonary artery. Mediastinal and thoracostomy tube is again identified as well as slightly increased mild bibasilar atelectasis. There is no evidence of pneumothorax. IMPRESSION: 1. Endotracheal tube and enteric tube removal with slightly increased mild bibasilar atelectasis. 2. Swan-Ganz catheter tip now overlying the pulmonary outflow tract/main pulmonary artery. Electronically Signed   By: Margarette Canada M.D.   On: 03/28/2022 08:08   ECHO INTRAOPERATIVE TEE  Result Date: 03/27/2022  *INTRAOPERATIVE TRANSESOPHAGEAL REPORT *  Patient Name:   Michael Hart Date of Exam: 03/27/2022 Medical Rec #:  [id number]        Height:       69.0 in Accession #:    [id number]       Weight:       235.0 lb Date of Birth:  1985-09-16        BSA:          2.21 m Patient Age:    66 years         BP:           123/74 mmHg Patient Gender: M                HR:           80 bpm. Exam Location:  Anesthesiology Transesophogeal exam was perform intraoperatively during surgical procedure. Patient was closely monitored under general anesthesia during the entirety of examination. Indications:     Coronary Artery Disease Sonographer:     Bernadene Person  RDCS  Performing Phys: [place] HENDRICK[place] Diagnosing Phys: Annye Asa MD Complications: No known complications during this procedure. POST-OP IMPRESSIONS limited Post-CPB exam: The patient separated easily from CPB _ Left Ventricle: The left ventricular function is unchanged from pre-bypass images. Overall EF 47%. LV function remains low normal, with severe antero-septal hypokinesis. _ Right Ventricle: The right ventricular function appears unchanged from pre-bypass images. _ Aortic Valve: The aortic valve function appears unchanged from pre-bypass images. _ Mitral Valve: The mitral valve function appears unchanged from pre-bypass images. PRE-OP FINDINGS  Left Ventricle: The left ventricle has mildly reduced systolic function, with an ejection fraction of 45-50%, measured 47%. The cavity size was normal. Moderate hypokinesis of the left ventricular, mid-apical anteroseptal wall. There is no left ventricular hypertrophy. Left ventricular diastolic function was not evaluated. Right Ventricle: The right ventricle has normal systolic function. The cavity was normal. There is no increase in right ventricular wall thickness. Catheter present in the right ventricle. Left Atrium: Left atrial size was normal in size. No left atrial/left atrial appendage thrombus was detected. Left atrial appendage velocity is normal at greater than 40 cm/s. Right Atrium: Right atrial size was normal in size. Catheter present in the right atrium. Interatrial Septum: No atrial level shunt detected by color flow Doppler. There is no evidence of a patent foramen ovale. Pericardium: There is no evidence of pericardial effusion. Mitral Valve: The mitral valve is normal in structure. Mitral valve regurgitation is trivial by color flow Doppler. There is no evidence of mitral valve vegetation. Pulmonary venous flow is normal. There is no evidence of mitral stenosis, with mean gradient 1 mmHg. Tricuspid Valve: The tricuspid valve was normal in  structure. Tricuspid valve regurgitation was not visualized by color flow Doppler. No evidence of tricuspid stenosis is present. There is no evidence of tricuspid valve vegetation. Aortic Valve: The aortic valve is tricuspid. Aortic valve regurgitation was not visualized by color flow Doppler. There is no stenosis of the aortic valve, with peak gradient 5 mmHg, mean gradient 3 mmHg. There is no evidence of aortic valve vegetation. Pulmonic Valve: The pulmonic valve was normal in structure, with normal leafelt mobility. No evidence of pulmonic stenosis. Pulmonic valve regurgitation is trivial by color flow Doppler, aroung the PA catheter. Aorta: The aortic root, ascending aorta and aortic arch are normal in size and structure. There is sparse evidence of plaque in the descending aorta; Grade I, measuring 1-2m in size. Pulmonary Artery: SGordy Councilmancatheter present on the left. The pulmonary artery is of normal size. Venous: The inferior vena cava is normal in size with greater than 50% respiratory variability, suggesting right atrial pressure of 3 mmHg. Shunts: There is no evidence of an atrial septal defect. +-------------+--------++ AORTIC VALVE          +-------------+--------++ AV Mean Grad:3.0 mmHg +-------------+--------++ +-------------+--------++ MITRAL VALVE          +-------------+--------++ MV Mean grad:1.0 mmHg +-------------+--------++  CAnnye AsaMD Electronically signed by CAnnye AsaMD Signature Date/Time: 03/27/2022/4:36:41 PM    Final    DG Chest Port 1 View  Result Date: 03/27/2022 CLINICAL DATA:  Pneumothorax.  Status post CABG. EXAM: PORTABLE CHEST 1 VIEW COMPARISON:  Chest radiograph 03/22/2022 FINDINGS: Sequelae of interval CABG are identified. A right jugular Swan-Ganz catheter terminates inferior to the right hilum. An enteric tube courses into the abdomen with side port near the GE junction and tip not imaged. A mediastinal drain and bilateral chest tubes are  present. An endotracheal  tube terminates 1-1.5 cm above the carina. The cardiac silhouette is normal in size. Lung volumes are low. No airspace consolidation, edema, sizable pleural effusion, or pneumothorax is identified. IMPRESSION: Interval CABG with support devices as above. No evidence of acute airspace disease or pneumothorax. Electronically Signed   By: Logan Bores M.D.   On: 03/27/2022 15:19    CARDIAC DATABASE: EKG: 03/28/2022: Normal sinus rhythm, 70 bpm, incomplete right bundle branch block, consider old anteroseptal infarct, without underlying injury pattern.  Coronary intervention 03/21/2022: LM: Normal LAD: Prox 100% occlusion          Mid 80-90% stenoses          Diffuse 80% diag disease Ramus: Large vessel. Prox 75% stenosis Lcx: Mid 95%, OM1 95% bifurcation stenosis RCA: Mid 80%, followed by 75% stenoses          Distal 80% stenosis   Successful percutaneous coronary intervention prox-mid LAD     Aspiration thrombectomy, PTCA, and overlapping stents placement     Synergy DES 4.0X12 mm, 3.5X48 mm, 3.0X16 mm     This was necessitated by flow limiting lesions beyond the culprit lesion     Post dilataation with 4.0X15 mm Arnold balloon upto 18 atm      We will consider nonculprit PCI to remaining vessels during index hospitalization.      Echocardiogram 03/21/2022:  1. Left ventricular ejection fraction, by estimation, is 40 to 45%. The  left ventricle has mildly decreased function. The left ventricle  demonstrates regional wall motion abnormalities (see scoring  diagram/findings for description). There is mild left  ventricular hypertrophy. Left ventricular diastolic parameters are  indeterminate. There is severe hypokinesis of the left ventricular,  mid-apical anteroseptal wall and anterior wall. There is akinesis of the  left ventricular, apical segment.   2. Right ventricular systolic function is normal. The right ventricular  size is normal.   3. The mitral valve is  normal in structure. No evidence of mitral valve  regurgitation. No evidence of mitral stenosis.   4. The aortic valve is normal in structure. Aortic valve regurgitation is  not visualized. No aortic stenosis is present.   Scheduled Meds:  acetaminophen  1,000 mg Oral Q6H   Or   acetaminophen (TYLENOL) oral liquid 160 mg/5 mL  1,000 mg Per Tube Q6H   allopurinol  100 mg Oral Daily   [START ON 03/29/2022] aspirin EC  81 mg Oral Daily   Or   [START ON 03/29/2022] aspirin  81 mg Per Tube Daily   bisacodyl  10 mg Oral Daily   Or   bisacodyl  10 mg Rectal Daily   Chlorhexidine Gluconate Cloth  6 each Topical Daily   colchicine  0.6 mg Oral BID   docusate sodium  200 mg Oral Daily   enoxaparin (LOVENOX) injection  40 mg Subcutaneous QHS   insulin aspart  0-24 Units Subcutaneous Q4H   [START ON 03/29/2022] insulin detemir  10 Units Subcutaneous Daily   metoprolol tartrate  12.5 mg Oral BID   Or   metoprolol tartrate  12.5 mg Per Tube BID   mupirocin ointment  1 Application Nasal BID   [START ON 03/29/2022] pantoprazole  40 mg Oral Daily   rosuvastatin  20 mg Oral Daily   sodium chloride flush  3 mL Intravenous Q12H   ticagrelor  90 mg Oral BID   traZODone  50 mg Oral QHS    Continuous Infusions:  sodium chloride 20 mL/hr at 03/28/22 0800  sodium chloride     sodium chloride 10 mL/hr at 03/28/22 0800   insulin 1.1 Units/hr (03/28/22 0800)   lactated ringers     lactated ringers     lactated ringers 20 mL/hr at 03/28/22 0800   nitroGLYCERIN Stopped (03/27/22 1616)   phenylephrine (NEO-SYNEPHRINE) Adult infusion Stopped (03/28/22 0625)    PRN Meds: sodium chloride, dextrose, lactated ringers, metoprolol tartrate, midazolam, morphine injection, ondansetron (ZOFRAN) IV, oxyCODONE, sodium chloride flush, SUMAtriptan, traMADol   IMPRESSION & RECOMMENDATIONS: Michael Hart is a 36 y.o. Caucasian male whose past medical history and cardiac risk factors include: Status post STEMI  September 2023, status post two-vessel CABG, hypertension, hyperlipidemia, family history of CAD.  Impression: Postop day 1 two-vessel CABG Recent anterior wall STEMI s/p PCI to LAD  Newly discovered HFrEF Hypertension. Hyperlipidemia. Family history of CAD  Plan: Postop day 1 vessel CABG /recent anterior wall STEMI status post PCI to the LAD without angina pectoris: Progressing well postoperatively. Chest pain at the surgical site and no angina factors. Discontinue Swan-Ganz catheter, pacer wires, and arterial line. Sitting up in chair and ambulating the floors Telemetry illustrates sinus rhythm without significant ectopy Pain control is adequate Reemphasized the importance of incentive spirometry and ambulation. Continue metoprolol, Crestor, Brilinta  Newly discovered HFrEF: Currently compensated. Will uptitrate GDMT closer to discharge.  Mixed hyperlipidemia: Continue statin therapy.  Gout: Currently on medical therapy.  We will follow peripherally.  Patient's questions and concerns were addressed to his satisfaction. He voices understanding of the instructions provided during this encounter.   This note was created using a voice recognition software as a result there may be grammatical errors inadvertently enclosed that do not reflect the nature of this encounter. Every attempt is made to correct such errors.  Mechele Claude [facility]  Pager: [phone] Office: [phone] 03/28/2022, 5:18 PM

## 2022-03-28 NOTE — Plan of Care (Signed)
  Problem: Education: Goal: Will demonstrate proper wound care and an understanding of methods to prevent future damage Outcome: Progressing Goal: Knowledge of disease or condition will improve Outcome: Progressing Goal: Knowledge of the prescribed therapeutic regimen will improve Outcome: Progressing Goal: Individualized Educational Video(s) Outcome: Progressing   Problem: Activity: Goal: Risk for activity intolerance will decrease Outcome: Progressing   Problem: Cardiac: Goal: Will achieve and/or maintain hemodynamic stability Outcome: Progressing   Problem: Clinical Measurements: Goal: Postoperative complications will be avoided or minimized Outcome: Progressing   Problem: Respiratory: Goal: Respiratory status will improve Outcome: Progressing   Problem: Skin Integrity: Goal: Wound healing without signs and symptoms of infection Outcome: Progressing Goal: Risk for impaired skin integrity will decrease Outcome: Progressing     

## 2022-03-29 ENCOUNTER — Inpatient Hospital Stay (HOSPITAL_COMMUNITY): Payer: 59

## 2022-03-29 LAB — CBC
HCT: 31.7 % — ABNORMAL LOW (ref 39.0–52.0)
Hemoglobin: 11.3 g/dL — ABNORMAL LOW (ref 13.0–17.0)
MCH: 31.2 pg (ref 26.0–34.0)
MCHC: 35.6 g/dL (ref 30.0–36.0)
MCV: 87.6 fL (ref 80.0–100.0)
Platelets: 334 10*3/uL (ref 150–400)
RBC: 3.62 MIL/uL — ABNORMAL LOW (ref 4.22–5.81)
RDW: 11.7 % (ref 11.5–15.5)
WBC: 17.3 10*3/uL — ABNORMAL HIGH (ref 4.0–10.5)
nRBC: 0 % (ref 0.0–0.2)

## 2022-03-29 LAB — BASIC METABOLIC PANEL
Anion gap: 8 (ref 5–15)
BUN: 12 mg/dL (ref 6–20)
CO2: 25 mmol/L (ref 22–32)
Calcium: 8.6 mg/dL — ABNORMAL LOW (ref 8.9–10.3)
Chloride: 99 mmol/L (ref 98–111)
Creatinine, Ser: 1.02 mg/dL (ref 0.61–1.24)
GFR, Estimated: >60 mL/min (ref >60)
Glucose, Bld: 128 mg/dL — ABNORMAL HIGH (ref 70–99)
Potassium: 4.4 mmol/L (ref 3.5–5.1)
Sodium: 132 mmol/L — ABNORMAL LOW (ref 135–145)

## 2022-03-29 LAB — GLUCOSE, CAPILLARY
Glucose-Capillary: 102 mg/dL — ABNORMAL HIGH (ref 70–99)
Glucose-Capillary: 120 mg/dL — ABNORMAL HIGH (ref 70–99)
Glucose-Capillary: 127 mg/dL — ABNORMAL HIGH (ref 70–99)
Glucose-Capillary: 131 mg/dL — ABNORMAL HIGH (ref 70–99)
Glucose-Capillary: 133 mg/dL — ABNORMAL HIGH (ref 70–99)
Glucose-Capillary: 134 mg/dL — ABNORMAL HIGH (ref 70–99)

## 2022-03-29 MED ORDER — SODIUM CHLORIDE 0.9 % IV SOLN: 250.0000 mL | INTRAVENOUS | Status: DC | PRN

## 2022-03-29 MED ORDER — INSULIN ASPART 100 UNIT/ML IJ SOLN: 0.0000 [IU] | Freq: Three times a day (TID) | INTRAMUSCULAR | Status: DC

## 2022-03-29 MED ORDER — ~~LOC~~ CARDIAC SURGERY, PATIENT & FAMILY EDUCATION: Freq: Once | Status: DC

## 2022-03-29 MED ORDER — FUROSEMIDE 10 MG/ML IJ SOLN
40.0000 mg | Freq: Once | INTRAMUSCULAR | Status: AC
  Administered 2022-03-29: 40 mg via INTRAVENOUS
  Filled 2022-03-29: qty 4

## 2022-03-29 MED ORDER — SODIUM CHLORIDE 0.9% FLUSH: 3.0000 mL | INTRAVENOUS | Status: DC | PRN

## 2022-03-29 MED ORDER — SODIUM CHLORIDE 0.9% FLUSH
3.0000 mL | Freq: Two times a day (BID) | INTRAVENOUS | Status: DC
  Administered 2022-03-29 – 2022-03-31 (×4): 3 mL via INTRAVENOUS

## 2022-03-29 MED ORDER — METHOCARBAMOL 500 MG PO TABS
500.0000 mg | ORAL_TABLET | Freq: Three times a day (TID) | ORAL | Status: DC
  Administered 2022-03-29 – 2022-03-31 (×7): 500 mg via ORAL
  Filled 2022-03-29 (×7): qty 1

## 2022-03-29 MED ORDER — LIDOCAINE 5 % EX PTCH
1.0000 | MEDICATED_PATCH | CUTANEOUS | Status: DC
  Administered 2022-03-29: 1 via TRANSDERMAL
  Filled 2022-03-29 (×3): qty 1

## 2022-03-29 NOTE — Progress Notes (Signed)
   03/29/22 1330  Clinical Encounter Type  Visited With Patient and family together;Health care provider (MOTHER: Joycelyn Das)  Visit Type Follow-up  Referral From  (Rounding)  Consult/Referral To Chaplain  Recommendations Rounding  Spiritual Encounters  Spiritual Needs Emotional   While rounding chaplain stopped to visit with patient and mother. Patient is resting comfortably and transferring to [address]. [address] [place] [phone]

## 2022-03-29 NOTE — Progress Notes (Signed)
   03/29/22 1446  Vitals  Temp 98.3 F (36.8 C)  Temp Source Oral  BP 118/68  MAP (mmHg) 83  BP Location Left Arm  BP Method Automatic  Patient Position (if appropriate) Lying  Pulse Rate (!) 106  Pulse Rate Source Monitor  ECG Heart Rate (!) 104  Resp 20  Level of Consciousness  Level of Consciousness Alert  MEWS COLOR  MEWS Score Color Green  Oxygen Therapy  SpO2 94 %  O2 Device Nasal Cannula  O2 Flow Rate (L/min) 1.5 L/min  Pain Assessment  Pain Scale 0-10  Pain Score 5  Glasgow Coma Scale  Eye Opening 4  Best Verbal Response (NON-intubated) 5  Best Motor Response 6  Glasgow Coma Scale Score 15  MEWS Score  MEWS Temp 0  MEWS Systolic 0  MEWS Pulse 1  MEWS RR 0  MEWS LOC 0  MEWS Score 1   Pt Admiited to 4E09 CCMD notified Pt ambulated to bed with minimal assistance Call bell within reach

## 2022-03-29 NOTE — Progress Notes (Signed)
      [address]       [place] [address]             [phone]                 2 Days Post-Op Procedure(s) (LRB): CORONARY ARTERY BYPASS GRAFTING (CABG) x2 , USING BILATERAL INTERNAL MAMMARY ARTERIES (N/A) ATTEMPTED RADIAL ARTERY HARVEST (Left) TRANSESOPHAGEAL ECHOCARDIOGRAM (TEE) (N/A) CYSTOSCOPY FLEXIBLE, PLACEMENT OF FOLEY CATHETER (N/A)   Events: No events _______________________________________________________________ Vitals: BP 113/70   Pulse 94   Temp 98.4 F (36.9 C)   Resp 20   Ht 5\' 9"  (1.753 m)   Wt 113.6 kg   SpO2 93%   BMI 36.98 kg/m  Filed Weights   03/27/22 0519 03/28/22 0615 03/29/22 0600  Weight: 106.6 kg 113.2 kg 113.6 kg     - Neuro: alert NAD  - Cardiovascular: sinus  Drips: none.      - Pulm: EWOB  ABG    Component Value Date/Time   PHART 7.356 03/27/2022 1850   PCO2ART 36.3 03/27/2022 1850   PO2ART 135 (H) 03/27/2022 1850   HCO3 20.3 03/27/2022 1850   TCO2 21 (L) 03/27/2022 1850   ACIDBASEDEF 5.0 (H) 03/27/2022 1850   O2SAT 99 03/27/2022 1850    - Abd: ND - Extremity: trace edema  .Intake/Output      10/07 0701 10/08 0700 10/08 0701 10/09 0700   P.Michael.  240   I.V. (mL/kg) 487.4 (4.3) 35.4 (0.3)   Blood     IV Piggyback     Total Intake(mL/kg) 487.4 (4.3) 275.4 (2.4)   Urine (mL/kg/hr) 1425 (0.5) 210 (0.5)   Blood     Chest Tube 350 130   Total Output 1775 340   Net -1287.6 -64.6           _______________________________________________________________ Labs:    Latest Ref Rng & Units 03/29/2022    6:30 AM 03/28/2022    6:01 PM 03/28/2022    4:53 AM  CBC  WBC 4.0 - 10.5 K/uL 17.3  14.4  10.2   Hemoglobin 13.0 - 17.0 g/dL 11.3  10.9  10.0   Hematocrit 39.0 - 52.0 % 31.7  32.0  28.4   Platelets 150 - 400 K/uL 334  289  232       Latest Ref Rng & Units 03/29/2022    6:30 AM 03/28/2022    6:01 PM 03/28/2022    4:53 AM  CMP  Glucose 70 - 99 mg/dL 128  190  112   BUN 6 - 20 mg/dL 12  14  13     Creatinine 0.61 - 1.24 mg/dL 1.02  0.94  0.90   Sodium 135 - 145 mmol/L 132  133  136   Potassium 3.5 - 5.1 mmol/L 4.4  4.3  4.5   Chloride 98 - 111 mmol/L 99  101  105   CO2 22 - 32 mmol/L 25  24  22    Calcium 8.9 - 10.3 mg/dL 8.6  8.3  8.1     CXR: PV congestion  _______________________________________________________________  Assessment and Plan: POD 2 s/p CABG with BIMA  Neuro: pain controlled CV:on brilinta. Pulm: IS ambulation Renal: creat stable.  Diuresing today.  Will keep foley for now GI: on diet Heme: stable ID: [email or web address] Endo: SSI Dispo: floor   Michael Hart Michael Hart 03/29/2022 10:26 AM

## 2022-03-30 ENCOUNTER — Encounter (HOSPITAL_COMMUNITY): Payer: Self-pay | Admitting: Thoracic Surgery (Cardiothoracic Vascular Surgery)

## 2022-03-30 LAB — BASIC METABOLIC PANEL
Anion gap: 10 (ref 5–15)
BUN: 13 mg/dL (ref 6–20)
CO2: 27 mmol/L (ref 22–32)
Calcium: 8.7 mg/dL — ABNORMAL LOW (ref 8.9–10.3)
Chloride: 95 mmol/L — ABNORMAL LOW (ref 98–111)
Creatinine, Ser: 0.89 mg/dL (ref 0.61–1.24)
GFR, Estimated: >60 mL/min (ref >60)
Glucose, Bld: 164 mg/dL — ABNORMAL HIGH (ref 70–99)
Potassium: 3.6 mmol/L (ref 3.5–5.1)
Sodium: 132 mmol/L — ABNORMAL LOW (ref 135–145)

## 2022-03-30 LAB — CBC
HCT: 30.2 % — ABNORMAL LOW (ref 39.0–52.0)
Hemoglobin: 11 g/dL — ABNORMAL LOW (ref 13.0–17.0)
MCH: 31.2 pg (ref 26.0–34.0)
MCHC: 36.4 g/dL — ABNORMAL HIGH (ref 30.0–36.0)
MCV: 85.6 fL (ref 80.0–100.0)
Platelets: 302 10*3/uL (ref 150–400)
RBC: 3.53 MIL/uL — ABNORMAL LOW (ref 4.22–5.81)
RDW: 11.7 % (ref 11.5–15.5)
WBC: 13.3 10*3/uL — ABNORMAL HIGH (ref 4.0–10.5)
nRBC: 0 % (ref 0.0–0.2)

## 2022-03-30 LAB — BPAM RBC
Blood Product Expiration Date: 202310262359
Blood Product Expiration Date: 202310272359
Blood Product Expiration Date: 202310282359
Blood Product Expiration Date: 202310282359
ISSUE DATE / TIME: 202310060727
ISSUE DATE / TIME: 202310060727
ISSUE DATE / TIME: 202310060727
ISSUE DATE / TIME: 202310060727
Unit Type and Rh: 6200
Unit Type and Rh: 6200
Unit Type and Rh: 6200
Unit Type and Rh: 6200

## 2022-03-30 LAB — TYPE AND SCREEN
ABO/RH(D): A POS
Antibody Screen: NEGATIVE
Unit division: 0
Unit division: 0
Unit division: 0
Unit division: 0

## 2022-03-30 LAB — GLUCOSE, CAPILLARY: Glucose-Capillary: 104 mg/dL — ABNORMAL HIGH (ref 70–99)

## 2022-03-30 MED ORDER — METOPROLOL TARTRATE 25 MG PO TABS
25.0000 mg | ORAL_TABLET | Freq: Two times a day (BID) | ORAL | Status: DC
  Administered 2022-03-30 (×2): 25 mg via ORAL
  Filled 2022-03-30 (×2): qty 1

## 2022-03-30 NOTE — Progress Notes (Addendum)
      [address]       [place] [address]             [phone]      3 Days Post-Op Procedure(s) (LRB): CORONARY ARTERY BYPASS GRAFTING (CABG) x2 , USING BILATERAL INTERNAL MAMMARY ARTERIES (N/A) ATTEMPTED RADIAL ARTERY HARVEST (Left) TRANSESOPHAGEAL ECHOCARDIOGRAM (TEE) (N/A) CYSTOSCOPY FLEXIBLE, PLACEMENT OF FOLEY CATHETER (N/A)  Subjective:  Doing okay.  Pain is well controlled.  Asks when foley can come out.  + ambulation   + BM  Objective: Vital signs in last 24 hours: Temp:  [97.8 F (36.6 C)-98.4 F (36.9 C)] 97.8 F (36.6 C) (10/09 0434) Pulse Rate:  [94-115] 98 (10/09 0434) Cardiac Rhythm: Sinus tachycardia (10/08 1900) Resp:  [17-24] 17 (10/09 0434) BP: (104-132)/(68-85) 104/75 (10/09 0434) SpO2:  [90 %-99 %] 99 % (10/09 0434) Weight:  [110.3 kg] 110.3 kg (10/09 0500)  Intake/Output from previous day: 10/08 0701 - 10/09 0700 In: 303.8 [P.O.:240; I.V.:63.8] Out: 2240 [Urine:2080; Chest Tube:160]  General appearance: alert, cooperative, and no distress Heart: regular rate and rhythm Lungs: clear to auscultation bilaterally Abdomen: soft, non-tender; bowel sounds normal; no masses,  no organomegaly Extremities: edema trace Wound: clean and dry  Lab Results: Recent Labs    03/29/22 0630 03/30/22 0151  WBC 17.3* 13.3*  HGB 11.3* 11.0*  HCT 31.7* 30.2*  PLT 334 302   BMET:  Recent Labs    03/29/22 0630 03/30/22 0151  NA 132* 132*  K 4.4 3.6  CL 99 95*  CO2 25 27  GLUCOSE 128* 164*  BUN 12 13  CREATININE 1.02 0.89  CALCIUM 8.6* 8.7*    PT/INR:  Recent Labs    03/27/22 1504  LABPROT 18.2*  INR 1.5*   ABG    Component Value Date/Time   PHART 7.356 03/27/2022 1850   HCO3 20.3 03/27/2022 1850   TCO2 21 (L) 03/27/2022 1850   ACIDBASEDEF 5.0 (H) 03/27/2022 1850   O2SAT 99 03/27/2022 1850   CBG (last 3)  Recent Labs    03/29/22 1719 03/29/22 2138 03/30/22 0616  GLUCAP 120* 127* 104*    Assessment/Plan: S/P  Procedure(s) (LRB): CORONARY ARTERY BYPASS GRAFTING (CABG) x2 , USING BILATERAL INTERNAL MAMMARY ARTERIES (N/A) ATTEMPTED RADIAL ARTERY HARVEST (Left) TRANSESOPHAGEAL ECHOCARDIOGRAM (TEE) (N/A) CYSTOSCOPY FLEXIBLE, PLACEMENT OF FOLEY CATHETER (N/A)  CV- Sinus Tachycardia- will increase Lopressor to 25 mg BID Pulm- weaning oxygen as tolerated, continue IS Renal- creatinine is stable, weight is below admission, will monitor closely, no Lasix at this time Hyponatremia- mild due to diuresis GU- H/O Urethral stricture, Urology placed foley in OR.. will remove today CBGs  controlled patient is not a diabetic will d/c SSIP Dispo- patient stable, will increase Lopressor for additional HR control, no further lasix needed at this time, he is diuresing well, attempt foley removal today, possibly ready for d/c in next 24-48 hours   LOS: 9 days   Ellwood Handler, PA-C 03/30/2022  Patient seen and examined, agree with above Doing well Pain well controlled for the most part Home tomorrow if continues to progress  Remo Lipps C. Roxan Hockey, MD [facility] [phone]

## 2022-03-30 NOTE — Progress Notes (Signed)
Able to wean pt off from 1.5L 02 to RA. Pt's saturation =91 % at RA .  Lavenia Atlas, RN

## 2022-03-30 NOTE — Progress Notes (Addendum)
CARDIAC REHAB PHASE I   PRE:  Rate/Rhythm: 106 ST  BP:  Sitting: 114/75      SaO2: 95 RA  MODE:  Ambulation: 120  ft   POST:  Rate/Rhythm: 120 ST  BP:  Sitting: 117/80      SaO2: 94 RA  Pt ambulated in hall using front wheel walker standby assist only. Pt tolerated well with no sob or dizziness. Back to room to bed with bedside table and call bell in reach. Encouraged two more walks, oob to chair and IS use today. Will continue to follow.  [id number]  Vanessa Barbara, RN BSN 03/30/2022 10:05 AM

## 2022-03-30 NOTE — Progress Notes (Signed)
D/c foley cath per order.   Lavenia Atlas, RN

## 2022-03-31 ENCOUNTER — Other Ambulatory Visit (HOSPITAL_COMMUNITY): Payer: Self-pay

## 2022-03-31 MED ORDER — METOPROLOL TARTRATE 50 MG PO TABS
50.0000 mg | ORAL_TABLET | Freq: Two times a day (BID) | ORAL | Status: DC
  Administered 2022-03-31: 50 mg via ORAL
  Filled 2022-03-31: qty 1

## 2022-03-31 MED ORDER — ACETAMINOPHEN 500 MG PO TABS: 500.0000 mg | ORAL_TABLET | Freq: Four times a day (QID) | ORAL | 0 refills | Status: DC

## 2022-03-31 MED ORDER — ASPIRIN 81 MG PO TBEC: 81.0000 mg | DELAYED_RELEASE_TABLET | Freq: Every day | ORAL | 12 refills | Status: DC

## 2022-03-31 MED ORDER — METOPROLOL TARTRATE 50 MG PO TABS
50.0000 mg | ORAL_TABLET | Freq: Two times a day (BID) | ORAL | 3 refills | Status: DC
  Filled 2022-03-31 – 2022-04-27 (×2): qty 60, 30d supply, fill #0
  Filled 2022-05-31: qty 60, 30d supply, fill #1
  Filled 2022-06-30: qty 60, 30d supply, fill #2

## 2022-03-31 MED ORDER — ROSUVASTATIN CALCIUM 20 MG PO TABS
20.0000 mg | ORAL_TABLET | Freq: Every day | ORAL | 3 refills | Status: DC
  Filled 2022-03-31 – 2022-04-27 (×2): qty 30, 30d supply, fill #0
  Filled 2022-05-31: qty 30, 30d supply, fill #1
  Filled 2022-06-30: qty 30, 30d supply, fill #2

## 2022-03-31 MED ORDER — OXYCODONE HCL 5 MG PO TABS
5.0000 mg | ORAL_TABLET | ORAL | 0 refills | Status: DC | PRN
  Filled 2022-03-31: qty 30, 5d supply, fill #0

## 2022-03-31 MED ORDER — TICAGRELOR 90 MG PO TABS
90.0000 mg | ORAL_TABLET | Freq: Two times a day (BID) | ORAL | 3 refills | Status: DC
  Filled 2022-03-31 – 2022-04-27 (×2): qty 60, 30d supply, fill #0
  Filled 2022-05-31: qty 60, 30d supply, fill #1
  Filled 2022-06-30: qty 60, 30d supply, fill #2

## 2022-03-31 NOTE — Progress Notes (Addendum)
      [address]       [place] [address]             [phone]      4 Days Post-Op Procedure(s) (LRB): CORONARY ARTERY BYPASS GRAFTING (CABG) x2 , USING BILATERAL INTERNAL MAMMARY ARTERIES (N/A) ATTEMPTED RADIAL ARTERY HARVEST (Left) TRANSESOPHAGEAL ECHOCARDIOGRAM (TEE) (N/A) CYSTOSCOPY FLEXIBLE, PLACEMENT OF FOLEY CATHETER (N/A)  Subjective:  Patient had a rough day yesterday.  States they got behind on pain overnight.  He is having some diarrhea. Also states HR goes up at times.  Overall doesn't feel up to going home today.  Objective: Vital signs in last 24 hours: Temp:  [98 F (36.7 C)-99.2 F (37.3 C)] 98 F (36.7 C) (10/10 0318) Pulse Rate:  [100-109] 109 (10/10 0318) Cardiac Rhythm: Normal sinus rhythm (10/10 0405) Resp:  [18-20] 20 (10/10 0318) BP: (99-128)/(65-80) 120/80 (10/10 0318) SpO2:  [91 %-99 %] 99 % (10/10 0318)  Intake/Output from previous day: 10/09 0701 - 10/10 0700 In: -  Out: 500 [Urine:500]  General appearance: alert, cooperative, and no distress Heart: regular rate and rhythm and tachycardia Lungs: clear to auscultation bilaterally Abdomen: soft, non-tender; bowel sounds normal; no masses,  no organomegaly Extremities: edema trace Wound: clean and dry  Lab Results: Recent Labs    03/29/22 0630 03/30/22 0151  WBC 17.3* 13.3*  HGB 11.3* 11.0*  HCT 31.7* 30.2*  PLT 334 302   BMET:  Recent Labs    03/29/22 0630 03/30/22 0151  NA 132* 132*  K 4.4 3.6  CL 99 95*  CO2 25 27  GLUCOSE 128* 164*  BUN 12 13  CREATININE 1.02 0.89  CALCIUM 8.6* 8.7*    PT/INR: No results for input(s): "LABPROT", "INR" in the last 72 hours. ABG    Component Value Date/Time   PHART 7.356 03/27/2022 1850   HCO3 20.3 03/27/2022 1850   TCO2 21 (L) 03/27/2022 1850   ACIDBASEDEF 5.0 (H) 03/27/2022 1850   O2SAT 99 03/27/2022 1850   CBG (last 3)  Recent Labs    03/29/22 1719 03/29/22 2138 03/30/22 0616  GLUCAP 120* 127* 104*     Assessment/Plan: S/P Procedure(s) (LRB): CORONARY ARTERY BYPASS GRAFTING (CABG) x2 , USING BILATERAL INTERNAL MAMMARY ARTERIES (N/A) ATTEMPTED RADIAL ARTERY HARVEST (Left) TRANSESOPHAGEAL ECHOCARDIOGRAM (TEE) (N/A) CYSTOSCOPY FLEXIBLE, PLACEMENT OF FOLEY CATHETER (N/A)  CV- Sinus Tachycardia-will increase Lopressor to 50 mg BID if he can tolerate Pulm- weaned off oxygen yesterday, back on overnight for supportive measures, continue IS Renal- not weighed today, minimal edema on exam, diuresis w/o difficulty GU- H/O stricture, voiding w/o difficulty GI- diarrhea, stop all stool softners, colchicine DIspo- patient stable, will increase Lopressor for additional HR control, patient to ambulate today, can hopefully send home in AM if he tolerates BB titration and feels better   LOS: 10 days    Ellwood Handler, PA-C 03/31/2022  Patient seen and examined, looks good A little tachycardic- agree with increasing Lopressor After talking with family he does feel like he is ready to go home Home later today if no issues after walking, etc.  Remo Lipps C. Roxan Hockey, MD [facility] [phone]

## 2022-03-31 NOTE — Plan of Care (Signed)
  Problem: Education: Goal: Knowledge of General Education information will improve Description: Including pain rating scale, medication(s)/side effects and non-pharmacologic comfort measures Outcome: Adequate for Discharge   Problem: Health Behavior/Discharge Planning: Goal: Ability to manage health-related needs will improve Outcome: Adequate for Discharge   Problem: Clinical Measurements: Goal: Ability to maintain clinical measurements within normal limits will improve Outcome: Adequate for Discharge Goal: Will remain free from infection Outcome: Adequate for Discharge Goal: Diagnostic test results will improve Outcome: Adequate for Discharge Goal: Respiratory complications will improve Outcome: Adequate for Discharge Goal: Cardiovascular complication will be avoided Outcome: Adequate for Discharge   Problem: Activity: Goal: Risk for activity intolerance will decrease Outcome: Adequate for Discharge   Problem: Coping: Goal: Level of anxiety will decrease Outcome: Adequate for Discharge   Problem: Elimination: Goal: Will not experience complications related to bowel motility Outcome: Adequate for Discharge Goal: Will not experience complications related to urinary retention Outcome: Adequate for Discharge   Problem: Activity: Goal: Ability to tolerate increased activity will improve Outcome: Adequate for Discharge   Problem: Education: Goal: Understanding of cardiac disease, CV risk reduction, and recovery process will improve Outcome: Adequate for Discharge Goal: Understanding of medication regimen will improve Outcome: Adequate for Discharge Goal: Individualized Educational Video(s) Outcome: Adequate for Discharge   Problem: Cardiac: Goal: Will achieve and/or maintain hemodynamic stability Outcome: Adequate for Discharge   Problem: Respiratory: Goal: Respiratory status will improve Outcome: Adequate for Discharge   Problem: Cardiac: Goal: Will achieve  and/or maintain hemodynamic stability Outcome: Adequate for Discharge   Problem: Skin Integrity: Goal: Wound healing without signs and symptoms of infection Outcome: Adequate for Discharge Goal: Risk for impaired skin integrity will decrease Outcome: Adequate for Discharge

## 2022-03-31 NOTE — Progress Notes (Signed)
Chest tube sutures removed per order. 

## 2022-03-31 NOTE — Progress Notes (Addendum)
CARDIAC REHAB PHASE I   PRE:  Rate/Rhythm: 99 SR  BP:  Sitting: 111/82      SaO2: 98  MODE:  Ambulation: 120 ft   POST:  Rate/Rhythm: 102 ST  BP:  Sitting:       SaO2: 98 RA  Pt ambulated in hall using front wheel walker standby assist only. Pt tolerated well with no cp or sob. Back to room to bed with call bell and bedside table in reach. Post [facility] home education including site care, move in the tub, exercise guidelines,sternal precautions, heart healthy diet, restrictions, risk factors, home needs, IS use at home and CRP2 reviewed. All questions and concerns addressed. Will refer to [facility] for CRP2. Plan for possible discharge later today.    [id number]   Vanessa Barbara, RN BSN 03/31/2022 12:30 PM

## 2022-04-01 ENCOUNTER — Telehealth: Payer: Self-pay

## 2022-04-01 ENCOUNTER — Other Ambulatory Visit (HOSPITAL_COMMUNITY): Payer: Self-pay

## 2022-04-01 MED FILL — Sodium Chloride IV Soln 0.9%: INTRAVENOUS | Qty: 2000 | Status: AC

## 2022-04-01 MED FILL — Potassium Chloride Inj 2 mEq/ML: INTRAVENOUS | Qty: 20 | Status: AC

## 2022-04-01 MED FILL — Mannitol IV Soln 20%: INTRAVENOUS | Qty: 500 | Status: AC

## 2022-04-01 MED FILL — Electrolyte-R (PH 7.4) Solution: INTRAVENOUS | Qty: 3000 | Status: AC

## 2022-04-01 MED FILL — Magnesium Sulfate Inj 50%: INTRAMUSCULAR | Qty: 10 | Status: AC

## 2022-04-01 MED FILL — Heparin Sodium (Porcine) Inj 1000 Unit/ML: INTRAMUSCULAR | Qty: 30 | Status: AC

## 2022-04-01 MED FILL — Sodium Bicarbonate IV Soln 8.4%: INTRAVENOUS | Qty: 50 | Status: AC

## 2022-04-01 MED FILL — Heparin Sodium (Porcine) Inj 1000 Unit/ML: Qty: 1000 | Status: AC

## 2022-04-01 NOTE — Telephone Encounter (Signed)
Patient called and states that he was discharged from the hospital from having Open Heart surgery on 03/27/2022, he states that he is having difficulty breathing and that he needs oxygen he wanted to know if there was a way we could get oxygen to him. He has an appointment with you on 04/13/22  Please advise

## 2022-04-01 NOTE — Telephone Encounter (Signed)
Spoke with patient and advised him of above information

## 2022-04-01 NOTE — Telephone Encounter (Signed)
Transition Care Management Unsuccessful Follow-up Telephone Call  Date of discharge and from where:  03/31/2022 from [facility].  Attempts:  1st Attempt  Reason for unsuccessful TCM follow-up call:  Left voice message    

## 2022-04-01 NOTE — Telephone Encounter (Signed)
It looks like he was just discharged from hospital yesterday. I would advise him to call surgeon's office or return to the hospital depending on the severity of his breathing difficulties.

## 2022-04-02 ENCOUNTER — Inpatient Hospital Stay: Payer: 59 | Admitting: Family Medicine

## 2022-04-02 ENCOUNTER — Telehealth: Payer: Self-pay

## 2022-04-02 NOTE — Telephone Encounter (Signed)
STD form completed and faxed to [facility] @ [phone]/ Beginning LOA 03/21/22 through 06/23/2022. Surgery date was 03/27/22

## 2022-04-02 NOTE — Telephone Encounter (Signed)
Spoke with patient.  He cancelled his hospital f/u.

## 2022-04-02 NOTE — Telephone Encounter (Signed)
Patient tried to return call and asked if he could get call back at [phone]

## 2022-04-13 ENCOUNTER — Ambulatory Visit: Payer: 59

## 2022-04-13 VITALS — BP 114/61 | HR 96 | Temp 98.0°F | Resp 16 | Ht 69.0 in | Wt 228.0 lb

## 2022-04-13 DIAGNOSIS — I5020 Unspecified systolic (congestive) heart failure: Secondary | ICD-10-CM

## 2022-04-13 DIAGNOSIS — I10 Essential (primary) hypertension: Secondary | ICD-10-CM

## 2022-04-13 DIAGNOSIS — Z951 Presence of aortocoronary bypass graft: Secondary | ICD-10-CM

## 2022-04-13 NOTE — Progress Notes (Signed)
Primary Physician/Referring:  Avanell Shackleton, NP-C  Patient ID: Michael Hart, male    DOB: 19-May-1986, 36 y.o.   MRN: [id number]  Chief Complaint  Patient presents with   Hypertension   New Patient (Initial Visit)   HPI:    Michael Hart  is a 36 y.o. male with a history of CAD status post 2 vessel CABG on 03/27/2022, hypertension, asthma, gout, nephrolithiasis, panic attacks, and a family history of CAD. He smoked briefly but quit 9 years ago.   He presents today for 3 week follow-up post CABG. Overall, he is doing well without complaints. He did have episode of shortness of breath and apnea after discharge from hospital. He felt this was due to having to sleep on his back. This has since resolved. He has not had sleep apnea testing. He is walking 30 minutes daily and following a healthy diet. He is still using pillow with positional changes and coughing and deep breathing. He denies chest pain, shortness of breath, palpitations, leg edema, syncope.  Past Medical History:  Diagnosis Date   Allergy    Asthma    Gout    Kidney stone    Panic attacks    nocturnal, controls with self directed biofeedback   Primary hypertension 03/03/2022   Past Surgical History:  Procedure Laterality Date   CORONARY ARTERY BYPASS GRAFT N/A 03/27/2022   Procedure: CORONARY ARTERY BYPASS GRAFTING (CABG) x2 , USING BILATERAL INTERNAL MAMMARY ARTERIES;  Surgeon: Loreli Slot, MD;  Location: [facility] OR;  Service: Open Heart Surgery;  Laterality: N/A;   CORONARY/GRAFT ACUTE MI REVASCULARIZATION N/A 03/21/2022   Procedure: Coronary/Graft Acute MI Revascularization;  Surgeon: Elder Negus, MD;  Location: [facility];  Service: Cardiovascular;  Laterality: N/A;   CYSTOSCOPY N/A 03/27/2022   Procedure: CYSTOSCOPY FLEXIBLE, PLACEMENT OF FOLEY CATHETER;  Surgeon: Jannifer Hick, MD;  Location: [facility] OR;  Service: Urology;  Laterality: N/A;   NO PAST SURGERIES     RADIAL ARTERY HARVEST Left  03/27/2022   Procedure: ATTEMPTED RADIAL ARTERY HARVEST;  Surgeon: Loreli Slot, MD;  Location: [facility] OR;  Service: Open Heart Surgery;  Laterality: Left;   TEE WITHOUT CARDIOVERSION N/A 03/27/2022   Procedure: TRANSESOPHAGEAL ECHOCARDIOGRAM (TEE);  Surgeon: Loreli Slot, MD;  Location: [facility];  Service: Open Heart Surgery;  Laterality: N/A;   Family History  Problem Relation Age of Onset   Breast cancer Mother    Lung cancer Father    Heart disease Father        CABG   COPD Father    Hypertension Other    Stroke Other    Migraines Neg Hx    Headache Neg Hx    Sleep apnea Neg Hx     Social History   Tobacco Use   Smoking status: Former    Packs/day: 0.50    Years: 12.00    Total pack years: 6.00    Types: Cigarettes    Quit date: 2013    Years since quitting: 10.8   Smokeless tobacco: Never  Substance Use Topics   Alcohol use: Not Currently    Alcohol/week: 2.0 standard drinks of alcohol    Types: 2 Glasses of wine per week    Comment: occasional   Marital Status: Divorced  ROS  Review of Systems  Cardiovascular:  Negative for chest pain, dyspnea on exertion, leg swelling, palpitations and syncope.  Respiratory:  Negative for shortness of breath.   Neurological:  Negative for dizziness and light-headedness.   Objective  Blood pressure 114/61, pulse 96, temperature 98 F (36.7 C), temperature source Temporal, resp. rate 16, height 5\' 9"  (1.753 m), weight 228 lb (103.4 kg), SpO2 98 %. Body mass index is 33.67 kg/m.     04/13/2022   10:53 AM 03/31/2022    1:00 PM 03/31/2022    8:39 AM  Vitals with BMI  Height 5\' 9"     Weight 228 lbs    BMI 33.65    Systolic 114 117 05/31/2022  Diastolic 61 73 82  Pulse 96 108 114     Physical Exam Cardiovascular:     Rate and Rhythm: Normal rate and regular rhythm.     Pulses: Normal pulses.     Heart sounds: Normal heart sounds. No murmur heard.    No gallop.  Pulmonary:     Effort: Pulmonary effort is normal.      Breath sounds: Normal breath sounds. No wheezing or rales.  Abdominal:     General: Bowel sounds are normal.     Palpations: Abdomen is soft.     Tenderness: There is no abdominal tenderness.  Musculoskeletal:     Right lower leg: No edema.     Left lower leg: No edema.  Skin:    Comments: Midline sternotomy scar healing with issues, no redness, warmth or edema noted. Abdominal chest tube and epicardial wire puncture sites with minimal drainage, no redness, warmth or edema noted.  Neurological:     Mental Status: He is alert.    Medications and allergies   Allergies  Allergen Reactions   Penicillins Shortness Of Breath, Swelling and Other (See Comments)    Childhood reaction   Sulfa Antibiotics Shortness Of Breath, Swelling and Other (See Comments)    Childhood reaction     Medication list after today's encounter   Current Outpatient Medications:    acetaminophen (TYLENOL) 500 MG tablet, Take 1-2 tablets (500-1,000 mg total) by mouth every 6 (six) hours., Disp: 30 tablet, Rfl: 0   allopurinol (ZYLOPRIM) 100 MG tablet, Take 1 tablet (100 mg total) by mouth daily., Disp: 30 tablet, Rfl: 5   aspirin EC 81 MG tablet, Take 1 tablet (81 mg total) by mouth daily. Swallow whole., Disp: 30 tablet, Rfl: 12   cetirizine (ZYRTEC) 10 MG tablet, Take 10 mg by mouth as needed., Disp: , Rfl:    metoprolol tartrate (LOPRESSOR) 50 MG tablet, Take 1 tablet (50 mg total) by mouth 2 (two) times daily., Disp: 60 tablet, Rfl: 3   oxyCODONE (OXY IR/ROXICODONE) 5 MG immediate release tablet, Take 1 tablet (5 mg total) by mouth every 4 (four) hours as needed for severe pain., Disp: 30 tablet, Rfl: 0   rosuvastatin (CRESTOR) 20 MG tablet, Take 1 tablet (20 mg total) by mouth daily., Disp: 30 tablet, Rfl: 3   TART CHERRY PO, Take 1 tablet by mouth daily., Disp: , Rfl:    ticagrelor (BRILINTA) 90 MG TABS tablet, Take 1 tablet (90 mg total) by mouth 2 (two) times daily., Disp: 60 tablet, Rfl: 3   rizatriptan  (MAXALT-MLT) 10 MG disintegrating tablet, Dissolve 1 tablet (10 mg total) by mouth as needed for migraine. May repeat in 2 hours if needed (Patient not taking: Reported on 04/13/2022), Disp: 9 tablet, Rfl: 11  Laboratory examination:   Lab Results  Component Value Date   NA 132 (L) 03/30/2022   K 3.6 03/30/2022   CO2 27 03/30/2022   GLUCOSE 164 (H) 03/30/2022  BUN 13 03/30/2022   CREATININE 0.89 03/30/2022   CALCIUM 8.7 (L) 03/30/2022   GFRNONAA >60 03/30/2022       Latest Ref Rng & Units 03/30/2022    1:51 AM 03/29/2022    6:30 AM 03/28/2022    6:01 PM  CMP  Glucose 70 - 99 mg/dL 164  128  190   BUN 6 - 20 mg/dL 13  12  14    Creatinine 0.61 - 1.24 mg/dL 0.89  1.02  0.94   Sodium 135 - 145 mmol/L 132  132  133   Potassium 3.5 - 5.1 mmol/L 3.6  4.4  4.3   Chloride 98 - 111 mmol/L 95  99  101   CO2 22 - 32 mmol/L 27  25  24    Calcium 8.9 - 10.3 mg/dL 8.7  8.6  8.3       Latest Ref Rng & Units 03/30/2022    1:51 AM 03/29/2022    6:30 AM 03/28/2022    6:01 PM  CBC  WBC 4.0 - 10.5 K/uL 13.3  17.3  14.4   Hemoglobin 13.0 - 17.0 g/dL 11.0  11.3  10.9   Hematocrit 39.0 - 52.0 % 30.2  31.7  32.0   Platelets 150 - 400 K/uL 302  334  289     Lipid Panel Recent Labs    01/28/22 0924 2022/04/16 0105 03/23/22 1021  CHOL 239* 181 160  TRIG 370.0* 432* 201*  LDLCALC  --  UNABLE TO CALCULATE IF TRIGLYCERIDE OVER 400 mg/dL 82  VLDL 74.0* UNABLE TO CALCULATE IF TRIGLYCERIDE OVER 400 mg/dL 40  HDL 32.20* 29* 38*  CHOLHDL 7 6.2 4.2  LDLDIRECT 151.0 109* 96    HEMOGLOBIN A1C Lab Results  Component Value Date   HGBA1C 5.2 04-16-22   MPG 102.54 Apr 16, 2022   TSH Recent Labs    01/28/22 0924  TSH 1.81    External labs:    Radiology:    Cardiac Studies:   Cardiac Catherization April 16, 2022: LM: Normal LAD: Prox 100% occlusion          Mid 80-90% stenoses          Diffuse 80% diag disease Ramus: Large vessel. Prox 75% stenosis Lcx: Mid 95%, OM1 95% bifurcation  stenosis RCA: Mid 80%, followed by 75% stenoses          Distal 80% stenosis    Echocardiogram 04-16-2022:   1. Left ventricular ejection fraction, by estimation, is 40 to 45%. The left ventricle has mildly decreased function. The left ventricle demonstrates regional wall motion abnormalities (see scoring diagram/findings for description). There is mild left  ventricular hypertrophy. Left ventricular diastolic parameters are indeterminate. There is severe hypokinesis of the left ventricular, mid-apical anteroseptal wall and anterior wall. There is akinesis of the left ventricular, apical segment.  2. Right ventricular systolic function is normal. The right ventricular size is normal.  3. The mitral valve is normal in structure. No evidence of mitral valve regurgitation. No evidence of mitral stenosis.  4. The aortic valve is normal in structure. Aortic valve regurgitation is not visualized. No aortic stenosis is present.  Upper Extremity and Bilateral Carotid US Pre-CABG 03/25/2022: Summary:  Right Carotid: The extracranial vessels were near-normal with only minimal  wall thickening or plaque.  Left Carotid: The extracranial vessels were near-normal with only minimal  wall thickening or plaque.  Vertebrals:  Bilateral vertebral arteries demonstrate antegrade flow. Left vertebral artery demonstrates high resistant flow.  Subclavians: Normal flow hemodynamics were  seen in bilateral subclavian arteries.   Right Upper Extremity: Doppler waveforms remain within normal limits with  right radial compression. Doppler waveforms decrease >50% with right ulnar  compression.  Left Upper Extremity: Doppler waveforms remain within normal limits with  left radial compression. Doppler waveforms decrease >50% with left ulnar  compression.   CABG 2-Vessel 03/27/2022: Median sternotomy, extracorporeal circulation Coronary artery bypass grafting x 2  Left internal mammary artery to ramus intermedius  Free  right internal mammary artery to distal right coronary.  EKG:   EKG 04/13/2022: Sinus tachycardia at 107 bpm.  Right axis.  Incomplete right bundle branch block.  Old anterior infarct.  Negative T waves.  Assessment     ICD-10-CM   1. S/P CABG x 2  Z95.1     2. HFrEF (heart failure with reduced ejection fraction) (HCC)  I50.20     3. Primary hypertension  I10 EKG 12-Lead       Orders Placed This Encounter  Procedures   EKG 12-Lead   No orders of the defined types were placed in this encounter.  Medications Discontinued During This Encounter  Medication Reason   bismuth subsalicylate (PEPTO BISMOL) 262 MG chewable tablet      Recommendations:   S/P CABG x 2 He is doing well post-op. Surgical sites are healing well without complications.  He has follow-up with CT surgery on November 7th. Referral for cardiac rehab has been placed and he will be cleared by CT surgery for this. Defer to CT surgery for post-op restriction. Will continue Ticagrelor and Aspiring for 12 months, he is tolerating these well without bleeding diathesis.   HFrEF (heart failure with reduced ejection fraction) (HCC) No clinical evidence of heart failure on physical examination. Will plan to repeat echocardiogram in 6 months. He is currently walking at least 30 minutes/day and following a healthy diet. Discussed sleep apnea testing and he will follow-up with his PCP regarding this.  Primary hypertension Blood pressures well controlled.  Current medications without changes.  Follow-up in 3 months or sooner if needed.   Nori Riis, AGNP-C  04/13/2022, 12:11 PM Office: [phone] Pager: [phone]

## 2022-04-14 ENCOUNTER — Other Ambulatory Visit: Payer: Self-pay | Admitting: Family Medicine

## 2022-04-14 ENCOUNTER — Ambulatory Visit: Payer: 59 | Admitting: Family Medicine

## 2022-04-14 ENCOUNTER — Other Ambulatory Visit (INDEPENDENT_AMBULATORY_CARE_PROVIDER_SITE_OTHER): Payer: 59

## 2022-04-14 DIAGNOSIS — R7989 Other specified abnormal findings of blood chemistry: Secondary | ICD-10-CM

## 2022-04-14 DIAGNOSIS — E782 Mixed hyperlipidemia: Secondary | ICD-10-CM

## 2022-04-14 DIAGNOSIS — M1A9XX0 Chronic gout, unspecified, without tophus (tophi): Secondary | ICD-10-CM

## 2022-04-14 DIAGNOSIS — I10 Essential (primary) hypertension: Secondary | ICD-10-CM

## 2022-04-14 LAB — LIPID PANEL
Cholesterol: 115 mg/dL (ref 0–200)
HDL: 25.8 mg/dL — ABNORMAL LOW (ref >39.00)
NonHDL: 89.11
Total CHOL/HDL Ratio: 4
Triglycerides: 227 mg/dL — ABNORMAL HIGH (ref 0.0–149.0)
VLDL: 45.4 mg/dL — ABNORMAL HIGH (ref 0.0–40.0)

## 2022-04-14 LAB — COMPREHENSIVE METABOLIC PANEL
ALT: 43 U/L (ref 0–53)
AST: 22 U/L (ref 0–37)
Albumin: 4.5 g/dL (ref 3.5–5.2)
Alkaline Phosphatase: 64 U/L (ref 39–117)
BUN: 14 mg/dL (ref 6–23)
CO2: 27 mEq/L (ref 19–32)
Calcium: 10.1 mg/dL (ref 8.4–10.5)
Chloride: 100 mEq/L (ref 96–112)
Creatinine, Ser: 0.98 mg/dL (ref 0.40–1.50)
GFR: 97.83 mL/min (ref >60.00)
Glucose, Bld: 93 mg/dL (ref 70–99)
Potassium: 4.2 mEq/L (ref 3.5–5.1)
Sodium: 137 mEq/L (ref 135–145)
Total Bilirubin: 0.4 mg/dL (ref 0.2–1.2)
Total Protein: 8.1 g/dL (ref 6.0–8.3)

## 2022-04-14 LAB — CBC WITH DIFFERENTIAL/PLATELET
Basophils Absolute: 0.1 10*3/uL (ref 0.0–0.1)
Basophils Relative: 1.2 % (ref 0.0–3.0)
Eosinophils Absolute: 0.2 10*3/uL (ref 0.0–0.7)
Eosinophils Relative: 3 % (ref 0.0–5.0)
HCT: 37.5 % — ABNORMAL LOW (ref 39.0–52.0)
Hemoglobin: 12.7 g/dL — ABNORMAL LOW (ref 13.0–17.0)
Lymphocytes Relative: 20 % (ref 12.0–46.0)
Lymphs Abs: 1.4 10*3/uL (ref 0.7–4.0)
MCHC: 33.9 g/dL (ref 30.0–36.0)
MCV: 85.8 fl (ref 78.0–100.0)
Monocytes Absolute: 0.5 10*3/uL (ref 0.1–1.0)
Monocytes Relative: 7.9 % (ref 3.0–12.0)
Neutro Abs: 4.7 10*3/uL (ref 1.4–7.7)
Neutrophils Relative %: 67.9 % (ref 43.0–77.0)
Platelets: 553 10*3/uL — ABNORMAL HIGH (ref 150.0–400.0)
RBC: 4.37 Mil/uL (ref 4.22–5.81)
RDW: 12.6 % (ref 11.5–15.5)
WBC: 6.9 10*3/uL (ref 4.0–10.5)

## 2022-04-14 LAB — LDL CHOLESTEROL, DIRECT: Direct LDL: 62 mg/dL

## 2022-04-14 LAB — URIC ACID: Uric Acid, Serum: 7.3 mg/dL (ref 4.0–7.8)

## 2022-04-16 ENCOUNTER — Other Ambulatory Visit (HOSPITAL_COMMUNITY): Payer: Self-pay

## 2022-04-16 ENCOUNTER — Ambulatory Visit: Payer: 59 | Admitting: Family Medicine

## 2022-04-16 ENCOUNTER — Encounter: Payer: Self-pay | Admitting: Family Medicine

## 2022-04-16 VITALS — BP 124/86 | HR 105 | Temp 97.6°F | Ht 69.0 in | Wt 227.0 lb

## 2022-04-16 DIAGNOSIS — I10 Essential (primary) hypertension: Secondary | ICD-10-CM

## 2022-04-16 DIAGNOSIS — D75839 Thrombocytosis, unspecified: Secondary | ICD-10-CM | POA: Diagnosis not present

## 2022-04-16 DIAGNOSIS — Z951 Presence of aortocoronary bypass graft: Secondary | ICD-10-CM

## 2022-04-16 DIAGNOSIS — E782 Mixed hyperlipidemia: Secondary | ICD-10-CM | POA: Diagnosis not present

## 2022-04-16 DIAGNOSIS — R0681 Apnea, not elsewhere classified: Secondary | ICD-10-CM

## 2022-04-16 DIAGNOSIS — Z9189 Other specified personal risk factors, not elsewhere classified: Secondary | ICD-10-CM | POA: Diagnosis not present

## 2022-04-16 DIAGNOSIS — E781 Pure hyperglyceridemia: Secondary | ICD-10-CM

## 2022-04-16 DIAGNOSIS — M1A9XX0 Chronic gout, unspecified, without tophus (tophi): Secondary | ICD-10-CM

## 2022-04-16 DIAGNOSIS — R7989 Other specified abnormal findings of blood chemistry: Secondary | ICD-10-CM

## 2022-04-16 LAB — CBC WITH DIFFERENTIAL/PLATELET
Basophils Absolute: 0.1 10*3/uL (ref 0.0–0.1)
Basophils Relative: 1.7 % (ref 0.0–3.0)
Eosinophils Absolute: 0.2 10*3/uL (ref 0.0–0.7)
Eosinophils Relative: 3.3 % (ref 0.0–5.0)
HCT: 37.5 % — ABNORMAL LOW (ref 39.0–52.0)
Hemoglobin: 12.7 g/dL — ABNORMAL LOW (ref 13.0–17.0)
Lymphocytes Relative: 23.8 % (ref 12.0–46.0)
Lymphs Abs: 1.6 10*3/uL (ref 0.7–4.0)
MCHC: 33.9 g/dL (ref 30.0–36.0)
MCV: 85.7 fl (ref 78.0–100.0)
Monocytes Absolute: 0.6 10*3/uL (ref 0.1–1.0)
Monocytes Relative: 8.7 % (ref 3.0–12.0)
Neutro Abs: 4.1 10*3/uL (ref 1.4–7.7)
Neutrophils Relative %: 62.5 % (ref 43.0–77.0)
Platelets: 497 10*3/uL — ABNORMAL HIGH (ref 150.0–400.0)
RBC: 4.38 Mil/uL (ref 4.22–5.81)
RDW: 12.4 % (ref 11.5–15.5)
WBC: 6.6 10*3/uL (ref 4.0–10.5)

## 2022-04-16 MED ORDER — COLCHICINE 0.6 MG PO TABS
0.6000 mg | ORAL_TABLET | Freq: Two times a day (BID) | ORAL | 0 refills | Status: DC
  Filled 2022-04-16: qty 30, 15d supply, fill #0

## 2022-04-16 MED ORDER — ALLOPURINOL 100 MG PO TABS
200.0000 mg | ORAL_TABLET | Freq: Every day | ORAL | 2 refills | Status: DC
  Filled 2022-04-16 – 2022-04-27 (×2): qty 60, 30d supply, fill #0
  Filled 2022-05-31: qty 60, 30d supply, fill #1
  Filled 2022-06-30: qty 60, 30d supply, fill #2

## 2022-04-16 NOTE — Assessment & Plan Note (Signed)
Home sleep test ordered 

## 2022-04-16 NOTE — Patient Instructions (Signed)
Please go to the lab before you leave to recheck your platelet count.   Increase your allopurinol to 200 mg daily. I sent a new prescription but you will just take two 100 mg capsules.   Colchicine refilled to take for acute gout flares.   You will hear from the [facility] to set up home sleep test.   Follow up with me in 3-4 months.

## 2022-04-16 NOTE — Assessment & Plan Note (Signed)
Elevated platelets with previously normal level. Taking Brilinta. Recheck.

## 2022-04-16 NOTE — Progress Notes (Signed)
Subjective:     Patient ID: Michael Hart, male    DOB: [birth year], 36 y.o.   MRN: [id number]  Chief Complaint  Patient presents with   Follow-up    6 week f/u, did labs on Tuesday    HPI Patient is in today for follow up. Recent STEMI and CABG. 3 wks post op. Feeling fairly well today.   States he is losing weight. Down 20 lbs. Eating healthy and walking   Per cardiologist- he needs sleep study.   Low testosterone and has upcoming appt with urology.  He has taken Clomid in the past.   Right foot pain, feels like start of a gout attack. Took colchicine in the hospital but is out. Taking allopurinol 100 mg daily.   Denies fever, chills, dizziness, palpitations, shortness of breath, abdominal pain, N/V/D, urinary symptoms, LE edema.    There are no preventive care reminders to display for this patient.  Past Medical History:  Diagnosis Date   Allergy    Asthma    Gout    Kidney stone    Panic attacks    nocturnal, controls with self directed biofeedback   Primary hypertension 03/03/2022    Past Surgical History:  Procedure Laterality Date   CORONARY ARTERY BYPASS GRAFT N/A 03/27/2022   Procedure: CORONARY ARTERY BYPASS GRAFTING (CABG) x2 , USING BILATERAL INTERNAL MAMMARY ARTERIES;  Surgeon: Melrose Nakayama, MD;  Location: [place];  Service: Open Heart Surgery;  Laterality: N/A;   CORONARY/GRAFT ACUTE MI REVASCULARIZATION N/A 03/21/2022   Procedure: Coronary/Graft Acute MI Revascularization;  Surgeon: Nigel Mormon, MD;  Location: [place] CV LAB;  Service: Cardiovascular;  Laterality: N/A;   CYSTOSCOPY N/A 03/27/2022   Procedure: CYSTOSCOPY FLEXIBLE, PLACEMENT OF FOLEY CATHETER;  Surgeon: Janith Lima, MD;  Location: [place];  Service: Urology;  Laterality: N/A;   NO PAST SURGERIES     RADIAL ARTERY HARVEST Left 03/27/2022   Procedure: ATTEMPTED RADIAL ARTERY HARVEST;  Surgeon: Melrose Nakayama, MD;  Location: [place];  Service: Open Heart Surgery;   Laterality: Left;   TEE WITHOUT CARDIOVERSION N/A 03/27/2022   Procedure: TRANSESOPHAGEAL ECHOCARDIOGRAM (TEE);  Surgeon: Melrose Nakayama, MD;  Location: [place];  Service: Open Heart Surgery;  Laterality: N/A;    Family History  Problem Relation Age of Onset   Breast cancer Mother    Lung cancer Father    Heart disease Father        CABG   COPD Father    Hypertension Other    Stroke Other    Migraines Neg Hx    Headache Neg Hx    Sleep apnea Neg Hx     Social History   Socioeconomic History   Marital status: Divorced    Spouse name: Not on file   Number of children: 2   Years of education: Not on file   Highest education level: Not on file  Occupational History   Not on file  Tobacco Use   Smoking status: Former    Packs/day: 0.50    Years: 12.00    Total pack years: 6.00    Types: Cigarettes    Quit date: 2013    Years since quitting: 10.8   Smokeless tobacco: Never  Vaping Use   Vaping Use: Never used  Substance and Sexual Activity   Alcohol use: Not Currently    Alcohol/week: 2.0 standard drinks of alcohol    Types: 2 Glasses of wine per week  Comment: occasional   Drug use: No   Sexual activity: Not on file  Other Topics Concern   Not on file  Social History Narrative   Married, lives with spouse   Engineer, maintenance (IT), works in Furniture conservator/restorer   Social Determinants of Health   Financial Resource Strain: Not on file  Food Insecurity: No Food Insecurity (03/21/2022)   Hunger Vital Sign    Worried About Running Out of Food in the Last Year: Never true    Ran Out of Food in the Last Year: Never true  Transportation Needs: No Transportation Needs (03/21/2022)   PRAPARE - Administrator, Civil Service (Medical): No    Lack of Transportation (Non-Medical): No  Physical Activity: Not on file  Stress: Not on file  Social Connections: Not on file  Intimate Partner Violence: Not At Risk (03/21/2022)   Humiliation, Afraid, Rape, and Kick  questionnaire    Fear of Current or Ex-Partner: No    Emotionally Abused: No    Physically Abused: No    Sexually Abused: No    Outpatient Medications Prior to Visit  Medication Sig Dispense Refill   acetaminophen (TYLENOL) 500 MG tablet Take 1-2 tablets (500-1,000 mg total) by mouth every 6 (six) hours. 30 tablet 0   aspirin EC 81 MG tablet Take 1 tablet (81 mg total) by mouth daily. Swallow whole. 30 tablet 12   cetirizine (ZYRTEC) 10 MG tablet Take 10 mg by mouth as needed.     metoprolol tartrate (LOPRESSOR) 50 MG tablet Take 1 tablet (50 mg total) by mouth 2 (two) times daily. 60 tablet 3   oxyCODONE (OXY IR/ROXICODONE) 5 MG immediate release tablet Take 1 tablet (5 mg total) by mouth every 4 (four) hours as needed for severe pain. 30 tablet 0   rizatriptan (MAXALT-MLT) 10 MG disintegrating tablet Dissolve 1 tablet (10 mg total) by mouth as needed for migraine. May repeat in 2 hours if needed 9 tablet 11   rosuvastatin (CRESTOR) 20 MG tablet Take 1 tablet (20 mg total) by mouth daily. 30 tablet 3   TART CHERRY PO Take 1 tablet by mouth daily.     ticagrelor (BRILINTA) 90 MG TABS tablet Take 1 tablet (90 mg total) by mouth 2 (two) times daily. 60 tablet 3   allopurinol (ZYLOPRIM) 100 MG tablet Take 1 tablet (100 mg total) by mouth daily. 30 tablet 5   No facility-administered medications prior to visit.    Allergies  Allergen Reactions   Penicillins Shortness Of Breath, Swelling and Other (See Comments)    Childhood reaction   Sulfa Antibiotics Shortness Of Breath, Swelling and Other (See Comments)    Childhood reaction    ROS Denies fever, chills, dizziness, chest pain, palpitations, shortness of breath, abdominal pain, N/V/D, urinary symptoms, LE edema.      Objective:    Physical Exam Constitutional:      General: He is not in acute distress.    Appearance: He is not ill-appearing.  Cardiovascular:     Rate and Rhythm: Normal rate and regular rhythm.  Pulmonary:      Effort: Pulmonary effort is normal.     Breath sounds: Normal breath sounds.  Musculoskeletal:     Right lower leg: No edema.     Left lower leg: No edema.  Skin:    General: Skin is warm and dry.  Neurological:     General: No focal deficit present.     Mental Status: He is alert  and oriented to person, place, and time.  Psychiatric:        Mood and Affect: Mood normal.        Behavior: Behavior normal.        Thought Content: Thought content normal.     BP 124/86 (BP Location: Left Arm, Patient Position: Sitting, Cuff Size: Large)   Pulse (!) 105   Temp 97.6 F (36.4 C) (Temporal)   Ht 5\' 9"  (1.753 m)   Wt 227 lb (103 kg)   SpO2 97%   BMI 33.52 kg/m  Wt Readings from Last 3 Encounters:  04/16/22 227 lb (103 kg)  04/13/22 228 lb (103.4 kg)  03/30/22 243 lb 2.7 oz (110.3 kg)       Assessment & Plan:   Problem List Items Addressed This Visit       Cardiovascular and Mediastinum   Primary hypertension - Primary    Eating a healthier diet, continue weight loss and exercise as tolerated.  Started on metoprolol after STEMI in hospital. Followed by cardiology        Hematopoietic and Hemostatic   Thrombocytosis    Elevated platelets with previously normal level. Taking Brilinta. Recheck.       Relevant Orders   CBC with Differential/Platelet (Completed)     Other   At risk for sleep apnea    Home sleep test ordered.       Relevant Orders   Home sleep test   Chronic gout without tophus    Uric acid level not at goal. Increase allopurinol to 200 mg daily. Colchicine refilled for flares. Low purine diet.       Relevant Medications   colchicine 0.6 MG tablet   allopurinol (ZYLOPRIM) 100 MG tablet   Hypertriglyceridemia    Continue statin therapy. Low fat diet. Follow up with cardiologist.       Low testosterone    Awaiting repeat lab results. He has upcoming appt scheduled with urologist. Recommend discussing with urology. He has been under their care in  the past and tool Clomid.       Mixed hyperlipidemia   S/P CABG x 2   Witnessed episode of apnea    Home sleep test ordered       Relevant Orders   Home sleep test    I have changed 05/30/22. Alsteen "Hense"'s allopurinol. I am also having him start on colchicine. Additionally, I am having him maintain his cetirizine, rizatriptan, TART CHERRY PO, acetaminophen, aspirin EC, oxyCODONE, metoprolol tartrate, rosuvastatin, and ticagrelor.  Meds ordered this encounter  Medications   colchicine 0.6 MG tablet    Sig: Take 1 tablet (0.6 mg total) by mouth 2 (two) times daily. Take at onset of foot or ankle pain.    Dispense:  30 tablet    Refill:  0    Order Specific Question:   Supervising Provider    Answer:   Deanna Artis A [[id number]]   allopurinol (ZYLOPRIM) 100 MG tablet    Sig: Take 2 tablets (200 mg total) by mouth daily.    Dispense:  60 tablet    Refill:  2    Order Specific Question:   Supervising Provider    Answer:   Hillard Danker A [[id number]]

## 2022-04-16 NOTE — Assessment & Plan Note (Addendum)
Eating a healthier diet, continue weight loss and exercise as tolerated.  Started on metoprolol after STEMI in hospital. Followed by cardiology

## 2022-04-16 NOTE — Assessment & Plan Note (Signed)
Uric acid level not at goal. Increase allopurinol to 200 mg daily. Colchicine refilled for flares. Low purine diet.

## 2022-04-16 NOTE — Assessment & Plan Note (Signed)
Continue statin therapy. Low fat diet. Follow up with cardiologist.

## 2022-04-16 NOTE — Assessment & Plan Note (Signed)
Awaiting repeat lab results. He has upcoming appt scheduled with urologist. Recommend discussing with urology. He has been under their care in the past and tool Clomid.

## 2022-04-18 LAB — TESTOSTERONE, FREE & TOTAL
Free Testosterone: 34.7 pg/mL — ABNORMAL LOW (ref 35.0–155.0)
Testosterone, Total, LC-MS-MS: 181 ng/dL — ABNORMAL LOW (ref 250–1100)

## 2022-04-18 LAB — FSH/LH
FSH: 4 m[IU]/mL (ref 1.6–8.0)
LH: 3.6 m[IU]/mL (ref 1.5–9.3)

## 2022-04-20 ENCOUNTER — Ambulatory Visit (INDEPENDENT_AMBULATORY_CARE_PROVIDER_SITE_OTHER): Payer: Self-pay | Admitting: Physician Assistant

## 2022-04-20 ENCOUNTER — Ambulatory Visit
Admission: RE | Admit: 2022-04-20 | Discharge: 2022-04-20 | Disposition: A | Discharge status: Home / Self Care | Payer: 59 | Source: Ambulatory Visit | Attending: Thoracic Surgery (Cardiothoracic Vascular Surgery) | Admitting: Thoracic Surgery (Cardiothoracic Vascular Surgery)

## 2022-04-20 ENCOUNTER — Other Ambulatory Visit: Payer: Self-pay

## 2022-04-20 DIAGNOSIS — Z951 Presence of aortocoronary bypass graft: Secondary | ICD-10-CM

## 2022-04-20 DIAGNOSIS — T8130XA Disruption of wound, unspecified, initial encounter: Secondary | ICD-10-CM

## 2022-04-20 DIAGNOSIS — Z5189 Encounter for other specified aftercare: Secondary | ICD-10-CM

## 2022-04-20 NOTE — Progress Notes (Signed)
[address]       [place] [address]             [phone]       HPI: Mr. Hehn is a 36 year old male with a history of coronary artery disease, hypertension, asthma, gout, and nephrolithiasis.  Who presented with ST elevation myocardial infarction earlier this month.  He subsequently underwent CABG x2 by Dr. Roxan Hockey on 03/27/2022.  His postoperative course was uneventful and he was discharged on postop day 4.  He has not yet been seen by our practice in follow-up but called over the weekend reporting he had a dime-sized separation along the sternal incision.  He was asked to come in today for evaluation. Overall, he says he has been feeling well.  He is only using Tylenol for pain management at this point.  He denies any shortness of breath.  He denies having any fever.  Current Outpatient Medications  Medication Sig Dispense Refill   acetaminophen (TYLENOL) 500 MG tablet Take 1-2 tablets (500-1,000 mg total) by mouth every 6 (six) hours. 30 tablet 0   allopurinol (ZYLOPRIM) 100 MG tablet Take 2 tablets (200 mg total) by mouth daily. 60 tablet 2   aspirin EC 81 MG tablet Take 1 tablet (81 mg total) by mouth daily. Swallow whole. 30 tablet 12   cetirizine (ZYRTEC) 10 MG tablet Take 10 mg by mouth as needed.     colchicine 0.6 MG tablet Take 1 tablet (0.6 mg total) by mouth 2 (two) times daily. Take at onset of foot or ankle pain. 30 tablet 0   metoprolol tartrate (LOPRESSOR) 50 MG tablet Take 1 tablet (50 mg total) by mouth 2 (two) times daily. 60 tablet 3   oxyCODONE (OXY IR/ROXICODONE) 5 MG immediate release tablet Take 1 tablet (5 mg total) by mouth every 4 (four) hours as needed for severe pain. 30 tablet 0   rizatriptan (MAXALT-MLT) 10 MG disintegrating tablet Dissolve 1 tablet (10 mg total) by mouth as needed for migraine. May repeat in 2 hours if needed 9 tablet 11   rosuvastatin (CRESTOR) 20 MG tablet Take 1 tablet (20 mg total) by mouth daily. 30 tablet 3    TART CHERRY PO Take 1 tablet by mouth daily.     ticagrelor (BRILINTA) 90 MG TABS tablet Take 1 tablet (90 mg total) by mouth 2 (two) times daily. 60 tablet 3   No current facility-administered medications for this visit.    Physical Exam: Vital signs BP 110/72 Pulse 106 Respirations 20 SPO2 95% on room air  General: Mr. Ellender appears well.  He is in no distress.  Heart: Regular rhythm, mild tachycardia  Chest: Breath sounds are clear.  The sternotomy incision is healing nicely.  There is small 1 cm diameter separation at the midpoint of the incision.  This is about 1/2 cm deep.  It is clean and all tissues appear healthy.  The site was gently probed with a cotton-tipped applicator and there is no undermining or communication with any deep cavities.  Extremities: Left arm incision is healing with no sign of complication.  He has no peripheral edema.  Diagnostic Tests: None today  Impression/Plan: Superficial separation of a short segment of the sternotomy incision 3 weeks post CABG.  The site is clean and does not appear to communicate with any deeper cavity.  There is no purulent drainage.  I see no indication for antibiotics. We will treat with saline moist to dry  dressings.  The technique was demonstrated to Mr. Frieden and he was given ample supplies for daily dressing changes for the next week.  He has scheduled follow-up 1 week from today.   Leary Roca, PA-C [facility] [phone]

## 2022-04-20 NOTE — Patient Instructions (Signed)
Saline moist to dry dressings as instructed here in the office.  Change daily after shower.  Follow-up in 1 week as scheduled.

## 2022-04-20 NOTE — Progress Notes (Signed)
Patient contacted the office over the weekend concerned about a "dime sized" opening to the middle of the sternal incision. He is s/p CABG with Dr. Roxan Hockey 03/27/22. He stated that he has increase his activity since he was cleared to drive, although he has not seen CT surgery since discharge from the hospital. He states that he does have "clear" drainage coming from the opening. Patient states there is no increase redness, no temperature. Patient advised to come into the office today for wound check with PA. Patient is aware and does know that he needs a chest xray as well.

## 2022-04-27 ENCOUNTER — Other Ambulatory Visit: Payer: Self-pay | Admitting: Thoracic Surgery (Cardiothoracic Vascular Surgery)

## 2022-04-27 DIAGNOSIS — Z951 Presence of aortocoronary bypass graft: Secondary | ICD-10-CM

## 2022-04-28 ENCOUNTER — Other Ambulatory Visit (HOSPITAL_COMMUNITY): Payer: Self-pay

## 2022-04-28 ENCOUNTER — Ambulatory Visit (INDEPENDENT_AMBULATORY_CARE_PROVIDER_SITE_OTHER): Payer: Self-pay | Admitting: Thoracic Surgery (Cardiothoracic Vascular Surgery)

## 2022-04-28 ENCOUNTER — Ambulatory Visit
Admission: RE | Admit: 2022-04-28 | Discharge: 2022-04-28 | Disposition: A | Discharge status: Home / Self Care | Payer: 59 | Source: Ambulatory Visit | Attending: Thoracic Surgery (Cardiothoracic Vascular Surgery) | Admitting: Thoracic Surgery (Cardiothoracic Vascular Surgery)

## 2022-04-28 ENCOUNTER — Encounter: Payer: Self-pay | Admitting: Thoracic Surgery (Cardiothoracic Vascular Surgery)

## 2022-04-28 VITALS — BP 117/80 | HR 77 | Resp 20 | Ht 69.0 in | Wt 225.0 lb

## 2022-04-28 DIAGNOSIS — Z951 Presence of aortocoronary bypass graft: Secondary | ICD-10-CM

## 2022-04-28 NOTE — Progress Notes (Signed)
[address]       [place] [address]             [phone]     HPI: Michael Hart returns for a follow-up appointment after coronary bypass grafting  Michael Hart is a 36 year old man with a history of CAD, hypertension, asthma, gout, and nephrolithiasis.  He presented with an ST elevation MI in October.  He had coronary bypass grafting x2 on 03/27/2022 using bilateral mammary arteries.  His postoperative course was uncomplicated and he went home on day 4.  He saw Michael Hart last week with an open area of his sternal incision.  This was about a dime sized opening that was superficial.  He has been doing saline wet-to-dry dressing changes.  He is not having any fevers, chills, or sweats.  Mild sternal discomfort for which he is using Tylenol.  He has not been taking narcotics for couple of weeks now.  No recurrent anginal symptoms.  He has lost about 25 pounds since his surgery.  Past Medical History:  Diagnosis Date   Allergy    Asthma    Gout    Kidney stone    Panic attacks    nocturnal, controls with self directed biofeedback   Primary hypertension 03/03/2022    Current Outpatient Medications  Medication Sig Dispense Refill   acetaminophen (TYLENOL) 500 MG tablet Take 1-2 tablets (500-1,000 mg total) by mouth every 6 (six) hours. 30 tablet 0   allopurinol (ZYLOPRIM) 100 MG tablet Take 2 tablets (200 mg total) by mouth daily. 60 tablet 2   aspirin EC 81 MG tablet Take 1 tablet (81 mg total) by mouth daily. Swallow whole. 30 tablet 12   cetirizine (ZYRTEC) 10 MG tablet Take 10 mg by mouth as needed.     colchicine 0.6 MG tablet Take 1 tablet (0.6 mg total) by mouth 2 (two) times daily. Take at onset of foot or ankle pain. 30 tablet 0   metoprolol tartrate (LOPRESSOR) 50 MG tablet Take 1 tablet (50 mg total) by mouth 2 (two) times daily. 60 tablet 3   oxyCODONE (OXY IR/ROXICODONE) 5 MG immediate release tablet Take 1 tablet (5 mg total) by  mouth every 4 (four) hours as needed for severe pain. 30 tablet 0   rizatriptan (MAXALT-MLT) 10 MG disintegrating tablet Dissolve 1 tablet (10 mg total) by mouth as needed for migraine. May repeat in 2 hours if needed 9 tablet 11   rosuvastatin (CRESTOR) 20 MG tablet Take 1 tablet (20 mg total) by mouth daily. 30 tablet 3   TART CHERRY PO Take 1 tablet by mouth daily.     ticagrelor (BRILINTA) 90 MG TABS tablet Take 1 tablet (90 mg total) by mouth 2 (two) times daily. 60 tablet 3   No current facility-administered medications for this visit.    Physical Exam BP 117/80 (BP Location: Left Arm, Patient Position: Sitting, Cuff Size: Normal)   Pulse 77   Resp 20   Ht 5\' 9"  (1.753 m)   Wt 225 lb (102.1 kg)   SpO2 99% Comment: RA  BMI 33.13 kg/m  36 year old man in no acute distress Alert and oriented x3 with no focal deficits Cardiac regular rate and rhythm normal S1 and S2 Lungs clear with equal breath sounds bilaterally No peripheral edema Sternal incision with 3 cm long 1 cm wide 0.5 cm deep open area.  Some small skin bridges which were divided.  No exposed wires.  Granulating with no necrotic tissue present.  Diagnostic Tests: Narrative & Impression  CLINICAL DATA:  Three weeks post CABG   EXAM: CHEST - 2 VIEW   COMPARISON:  04/20/2022   FINDINGS: Normal heart size post CABG and coronary stenting.   Mediastinal contours and pulmonary vascularity normal.   Minimal residual RIGHT pleural effusion and basilar atelectasis.   Remaining lungs clear.   No infiltrate or pneumothorax.   Osseous structures unremarkable.   IMPRESSION: Minimal residual RIGHT pleural effusion and basilar atelectasis.     Electronically Signed   By: Michael Hart M.D.   On: 04/28/2022 12:39   I personally reviewed the chest x-ray images.  Overall good postoperative appearance following CABG.  Impression: Michael Hart is a 36 year old gentleman who presented with an ST elevation MI  about a month ago.  He had severe two-vessel disease and underwent coronary bypass grafting x2 on 03/27/2022.  His postoperative course was uncomplicated and he went home on day 4.  Overall he is doing well.  He has not had any recurrent anginal pain.  He has some mild incisional discomfort but is taking Tylenol for that.  He has not taken any narcotics in the past couple weeks.  He did have a superficial wound dehiscence in the midportion of the sternal wound.  He has been doing saline wet-to-dry dressing changes.  On exam the sternum is stable.  There were couple of small skin bridges superior to the previously packed area.  I opened those up.  The underlying tissue is granulating well.  No evidence of deep infection.  He may drive.  He should still not lift anything over 10 pounds for another 2 weeks.  He is planning to return to work early next year.  Plan: Continue local wound care with saline wet-to-dry dressing changes Return in 2 weeks to check on progress of wound. He knows to call if he has any worsening of the wound, fevers, or any other issues of concern.   Michael Slot, MD [facility] [phone]

## 2022-04-29 ENCOUNTER — Other Ambulatory Visit (HOSPITAL_COMMUNITY): Payer: Self-pay

## 2022-05-19 ENCOUNTER — Other Ambulatory Visit (HOSPITAL_COMMUNITY): Payer: Self-pay

## 2022-05-19 ENCOUNTER — Other Ambulatory Visit: Payer: Self-pay | Admitting: Family Medicine

## 2022-05-19 ENCOUNTER — Ambulatory Visit (INDEPENDENT_AMBULATORY_CARE_PROVIDER_SITE_OTHER): Payer: 59 | Admitting: Thoracic Surgery (Cardiothoracic Vascular Surgery)

## 2022-05-19 VITALS — BP 130/81 | HR 72 | Resp 20 | Ht 69.0 in | Wt 225.0 lb

## 2022-05-19 DIAGNOSIS — Z5189 Encounter for other specified aftercare: Secondary | ICD-10-CM

## 2022-05-19 DIAGNOSIS — M1A9XX0 Chronic gout, unspecified, without tophus (tophi): Secondary | ICD-10-CM

## 2022-05-19 DIAGNOSIS — Z951 Presence of aortocoronary bypass graft: Secondary | ICD-10-CM

## 2022-05-19 DIAGNOSIS — T8130XA Disruption of wound, unspecified, initial encounter: Secondary | ICD-10-CM

## 2022-05-19 MED ORDER — COLCHICINE 0.6 MG PO TABS
0.6000 mg | ORAL_TABLET | Freq: Two times a day (BID) | ORAL | 0 refills | Status: DC
  Filled 2022-05-19 – 2022-05-31 (×2): qty 30, 15d supply, fill #0

## 2022-05-19 NOTE — Telephone Encounter (Signed)
Do you want pt to continue this rx?

## 2022-05-19 NOTE — Progress Notes (Signed)
[address]       [place] [address]             [phone]     HPI: Mr. Zollie Beckers returns for a scheduled follow-up  Mohit "Hense" Fiero is a 36 year old male with a history of CAD, hypertension, asthma, gout, and nephrolithiasis.  He presented with a STEMI in October 2023.  He had angioplasty of his LAD to abort the STEMI but had residual severe disease.    He underwent coronary bypass grafting x2 on 03/27/2022 using bilateral mammary arteries.  We checked his radial artery but it was too small to use.  I bypassed a large ramus branch and the distal right coronary.  His obtuse marginals were small diffusely diseased vessels not suitable for grafting.  He did well postoperatively and went home on day 4.  He developed a superficial dehiscence of his sternal wound which required a local wound care with wet-to-dry dressing changes.  I last saw him in the office on 04/28/2022.  Since then the wound has completely epithelialized.  He has not had any anginal pain.  He does have some incisional pain particularly around the lower part of the sternum.  He is not using any oxycodone.  Past Medical History:  Diagnosis Date   Allergy    Asthma    Gout    Kidney stone    Panic attacks    nocturnal, controls with self directed biofeedback   Primary hypertension 03/03/2022    Current Outpatient Medications  Medication Sig Dispense Refill   acetaminophen (TYLENOL) 500 MG tablet Take 1-2 tablets (500-1,000 mg total) by mouth every 6 (six) hours. 30 tablet 0   allopurinol (ZYLOPRIM) 100 MG tablet Take 2 tablets (200 mg total) by mouth daily. 60 tablet 2   aspirin EC 81 MG tablet Take 1 tablet (81 mg total) by mouth daily. Swallow whole. 30 tablet 12   cetirizine (ZYRTEC) 10 MG tablet Take 10 mg by mouth as needed.     colchicine 0.6 MG tablet Take 1 tablet (0.6 mg total) by mouth 2 (two) times daily. Take at onset of foot or ankle pain. 30 tablet 0   metoprolol tartrate  (LOPRESSOR) 50 MG tablet Take 1 tablet (50 mg total) by mouth 2 (two) times daily. 60 tablet 3   oxyCODONE (OXY IR/ROXICODONE) 5 MG immediate release tablet Take 1 tablet (5 mg total) by mouth every 4 (four) hours as needed for severe pain. 30 tablet 0   rizatriptan (MAXALT-MLT) 10 MG disintegrating tablet Dissolve 1 tablet (10 mg total) by mouth as needed for migraine. May repeat in 2 hours if needed 9 tablet 11   rosuvastatin (CRESTOR) 20 MG tablet Take 1 tablet (20 mg total) by mouth daily. 30 tablet 3   TART CHERRY PO Take 1 tablet by mouth daily.     ticagrelor (BRILINTA) 90 MG TABS tablet Take 1 tablet (90 mg total) by mouth 2 (two) times daily. 60 tablet 3   No current facility-administered medications for this visit.    Physical Exam BP 130/81   Pulse 72   Resp 20   Ht 5\' 9"  (1.753 m)   Wt 225 lb (102.1 kg)   SpO2 97% Comment: RA  BMI 33.51 kg/m  36 year old man in no acute distress Alert and oriented x3 with no focal deficits Lungs clear with equal breath sounds Sternal incision clean dry and intact, sternum stable Left wrist incision well-healed  Diagnostic Tests:  None  Impression: Fortunato "Hense" Kuligowski is a 35 year old male with a history of CAD, hypertension, asthma, gout, and nephrolithiasis.  He presented with a STEMI in October 2023.  He had angioplasty of his LAD to abort the STEMI but had residual severe disease.  He underwent CABG x2 and bilateral mammary arteries with grafts to a large ramus branch and the distal right coronary on 03/27/2022.  Status post CABG-now about 7 weeks out from surgery.  Wounds are healed.  Sternum is stable.  There are no restrictions on his activities at this point, but he was advised to continue to build into new activities gradually.  Remains on aspirin, Brilinta, Crestor, and metoprolol.  Gout-has had some mild flareups.  Allopurinol was increased to 200 mg daily.  Usually not an issue as long as he remains  hydrated  Plan: Follow-up with Dr. Virgina Jock I will be happy to see him back anytime in the future if I can be of any further assistance with his care  Melrose Nakayama, MD [facility] [phone]

## 2022-05-20 ENCOUNTER — Ambulatory Visit (HOSPITAL_BASED_OUTPATIENT_CLINIC_OR_DEPARTMENT_OTHER): Payer: 59 | Attending: Family Medicine | Admitting: Internal Medicine

## 2022-05-20 DIAGNOSIS — G4733 Obstructive sleep apnea (adult) (pediatric): Secondary | ICD-10-CM | POA: Insufficient documentation

## 2022-05-20 DIAGNOSIS — R0681 Apnea, not elsewhere classified: Secondary | ICD-10-CM

## 2022-05-20 DIAGNOSIS — Z9189 Other specified personal risk factors, not elsewhere classified: Secondary | ICD-10-CM | POA: Diagnosis present

## 2022-05-21 ENCOUNTER — Telehealth (HOSPITAL_COMMUNITY): Payer: Self-pay

## 2022-05-21 NOTE — Telephone Encounter (Signed)
Pt insurance is active and benefits verified through [facility]. Co-pay $40.00, DED $600.00/$600.00 met, out of pocket $3,000.00/$3,000.00 met, co-insurance 0%. No pre-authorization required. Philip/[employer], 05/21/22 @ 11:26AM, HDI#[id number]   How many CR sessions are covered? (36 sessions for TCR, 72 sessions for ICR)72 Is this a lifetime maximum or an annual maximum? Lifetime Has the member used any of these services to date? No Is there a time limit (weeks/months) on start of program and/or program completion? No

## 2022-05-21 NOTE — Telephone Encounter (Signed)
Called patient to see if he was interested in participating in the Cardiac Rehab Program. Patient stated yes. Patient will come in for orientation on 05/26/22 @ 8AM and will attend the 8:15AM exercise class.   Pensions consultant.

## 2022-05-23 DIAGNOSIS — Z9189 Other specified personal risk factors, not elsewhere classified: Secondary | ICD-10-CM

## 2022-05-23 NOTE — Procedures (Signed)
    Patient Name: Michael Hart, Michael Hart Date: 05/20/2022 Gender: Male D.O.B: 1985-12-22 Age (years): 68 Referring Provider: Avanell Shackleton NP Height (inches): 69 Interpreting Physician: Jetty Duhamel MD, ABSM Weight (lbs): 220 RPSGT: [place] Sink BMI: 32 MRN: [id number] Neck Size: 18.00  CLINICAL INFORMATION Sleep Study Type: HST Indication for sleep study: OSA Epworth Sleepiness Score: 7  SLEEP STUDY TECHNIQUE A multi-channel overnight portable sleep study was performed. The channels recorded were: nasal airflow, thoracic respiratory movement, and oxygen saturation with a pulse oximetry. Snoring was also monitored.  MEDICATIONS Patient self administered medications include: none reported.  SLEEP ARCHITECTURE Patient was studied for 405 minutes. The sleep efficiency was 100.0 % and the patient was supine for 0%. The arousal index was 0.0 per hour.  RESPIRATORY PARAMETERS The overall AHI was 12.9 per hour, with a central apnea index of 0 per hour. The oxygen nadir was 91% during sleep.  CARDIAC DATA Mean heart rate during sleep was 74.8 bpm.  IMPRESSIONS - Mild obstructive sleep apnea occurred during this study (AHI = 12.9/h). - The patient had minimal or no oxygen desaturation during the study (Min O2 = 91%) - Patient snored.  DIAGNOSIS - Obstructive Sleep Apnea (G47.33)  RECOMMENDATIONS - Suggest CPAP titration sleep study or autopap. Other options would be based on clinical judgment. - Be careful with alcohol, sedatives and other CNS depressants that may worsen sleep apnea and disrupt normal sleep architecture. - Sleep hygiene should be reviewed to assess factors that may improve sleep quality. - Weight management and regular exercise should be initiated or continued.  [Electronically signed] 05/23/2022 03:37 PM  Jetty Duhamel MD, ABSM Diplomate, American Board of Sleep Medicine NPI: [id number]                         Jetty Duhamel Diplomate, American Board of Sleep Medicine  ELECTRONICALLY SIGNED ON:  05/23/2022, 3:33 PM [facility] PH: [phone]   FX: [phone] ACCREDITED BY THE AMERICAN ACADEMY OF SLEEP MEDICINE

## 2022-05-25 ENCOUNTER — Telehealth (HOSPITAL_COMMUNITY): Payer: Self-pay | Admitting: *Deleted

## 2022-05-25 NOTE — Telephone Encounter (Signed)
Spoke with the patient. Completed health history.Thayer Headings RN BSN

## 2022-05-26 ENCOUNTER — Encounter (HOSPITAL_COMMUNITY)
Admission: RE | Admit: 2022-05-26 | Discharge: 2022-05-26 | Disposition: A | Discharge status: Home / Self Care | Payer: 59 | Source: Ambulatory Visit | Attending: Cardiology | Admitting: Cardiology

## 2022-05-26 ENCOUNTER — Encounter (HOSPITAL_COMMUNITY): Payer: Self-pay

## 2022-05-26 ENCOUNTER — Other Ambulatory Visit: Payer: Self-pay

## 2022-05-26 VITALS — BP 112/80 | HR 82 | Ht 69.5 in | Wt 222.2 lb

## 2022-05-26 DIAGNOSIS — I252 Old myocardial infarction: Secondary | ICD-10-CM | POA: Insufficient documentation

## 2022-05-26 DIAGNOSIS — Z951 Presence of aortocoronary bypass graft: Secondary | ICD-10-CM

## 2022-05-26 DIAGNOSIS — G4730 Sleep apnea, unspecified: Secondary | ICD-10-CM

## 2022-05-26 DIAGNOSIS — I2102 ST elevation (STEMI) myocardial infarction involving left anterior descending coronary artery: Secondary | ICD-10-CM

## 2022-05-26 HISTORY — DX: Atherosclerotic heart disease of native coronary artery without angina pectoris: I25.10

## 2022-05-26 NOTE — Progress Notes (Signed)
Cardiac Rehab Medication Review by a Nurse  Does the patient  feel that his/her medications are working for him/her?  yes  Has the patient been experiencing any side effects to the medications prescribed?  no  Does the patient measure his/her own blood pressure or blood glucose at home?  yes   Does the patient have any problems obtaining medications due to transportation or finances?   no  Understanding of regimen: excellent Understanding of indications: excellent Potential of compliance: excellent    Nurse comments: Michael Hart is taking his medications as prescribed and has a good understanding of what his medications are for. Michael Hart has a blood pressure cuff he does not check his blood pressures on a regular basis    Michael Headings RN 05/26/2022 8:22 AM

## 2022-05-26 NOTE — Progress Notes (Signed)
Cardiac Individual Treatment Plan  Patient Details  Name: Michael Hart MRN: [id number] Date of Birth: 03-May-1986 Referring Provider:   Flowsheet Row INTENSIVE CARDIAC REHAB ORIENT from 05/26/2022 in [facility]  Referring Provider Truett Mainland, MD       Initial Encounter Date:  Flowsheet Row INTENSIVE CARDIAC REHAB ORIENT from 05/26/2022 in [facility]  Date 05/26/22       Visit Diagnosis: 03/21/22 STEMI, S/P DES prox LAD  03/27/22 S/P CABG x 2  Patient's Home Medications on Admission:  Current Outpatient Medications:    acetaminophen (TYLENOL) 500 MG tablet, Take 1-2 tablets (500-1,000 mg total) by mouth every 6 (six) hours. (Patient taking differently: Take 500-1,000 mg by mouth every 6 (six) hours as needed for moderate pain.), Disp: 30 tablet, Rfl: 0   allopurinol (ZYLOPRIM) 100 MG tablet, Take 2 tablets (200 mg total) by mouth daily., Disp: 60 tablet, Rfl: 2   aspirin EC 81 MG tablet, Take 1 tablet (81 mg total) by mouth daily. Swallow whole., Disp: 30 tablet, Rfl: 12   cetirizine (ZYRTEC) 10 MG tablet, Take 10 mg by mouth daily as needed for allergies., Disp: , Rfl:    colchicine 0.6 MG tablet, Take 1 tablet (0.6 mg total) by mouth 2 (two) times daily. Take at onset of foot or ankle pain. (Patient taking differently: Take 0.6 mg by mouth 2 (two) times daily as needed (Take at onset of foot or ankle pain.).), Disp: 30 tablet, Rfl: 0   metoprolol tartrate (LOPRESSOR) 50 MG tablet, Take 1 tablet (50 mg total) by mouth 2 (two) times daily., Disp: 60 tablet, Rfl: 3   rizatriptan (MAXALT-MLT) 10 MG disintegrating tablet, Dissolve 1 tablet (10 mg total) by mouth as needed for migraine. May repeat in 2 hours if needed, Disp: 9 tablet, Rfl: 11   rosuvastatin (CRESTOR) 20 MG tablet, Take 1 tablet (20 mg total) by mouth daily., Disp: 30 tablet, Rfl: 3   ticagrelor  (BRILINTA) 90 MG TABS tablet, Take 1 tablet (90 mg total) by mouth 2 (two) times daily., Disp: 60 tablet, Rfl: 3   oxyCODONE (OXY IR/ROXICODONE) 5 MG immediate release tablet, Take 1 tablet (5 mg total) by mouth every 4 (four) hours as needed for severe pain. (Patient not taking: Reported on 05/25/2022), Disp: 30 tablet, Rfl: 0  Past Medical History: Past Medical History:  Diagnosis Date   Allergy    Asthma    Coronary artery disease    Gout    Kidney stone    Panic attacks    nocturnal, controls with self directed biofeedback   Primary hypertension 03/03/2022    Tobacco Use: Social History   Tobacco Use  Smoking Status Former   Packs/day: 0.50   Years: 12.00   Total pack years: 6.00   Types: Cigarettes   Quit date: 2013   Years since quitting: 10.9  Smokeless Tobacco Never    Labs: Review Flowsheet  More data exists      Latest Ref Rng & Units 03/21/2022 03/23/2022 03/26/2022 03/27/2022 04/14/2022  Labs for ITP Cardiac and Pulmonary Rehab  Cholestrol 0 - 200 mg/dL 409  811  - - 914   LDL (calc) 0 - 99 mg/dL UNABLE TO CALCULATE IF TRIGLYCERIDE OVER 400 mg/dL  82  - - -  Direct LDL mg/dL 782  96  - - 95.6   HDL-C >39.00 mg/dL 29  38  - -  25.80   Trlycerides 0.0 - 149.0 mg/dL 387  564  - - 332.9   Hemoglobin A1c 4.8 - 5.6 % 5.2  - - - -  PH, Arterial 7.35 - 7.45 - - 7.47  7.356  7.368  7.344  7.312  7.355  7.361  7.359  7.383  7.392  -  PCO2 arterial 32 - 48 mmHg - - 29  36.3  38.2  41.5  46.9  40.7  44.6  44.6  42.3  39.9  -  Bicarbonate 20.0 - 28.0 mmol/L - - 21.1  20.3  22.0  22.7  23.8  22.8  25.3  25.1  25.5  25.2  24.2  -  TCO2 22 - 32 mmol/L 21  - - 21  23  24  25  25  24  23  27  26  25  27  26  24  25  26   -  Acid-base deficit 0.0 - 2.0 mmol/L - - 1.5  5.0  3.0  3.0  3.0  3.0  1.0  -  O2 Saturation % - - 99.8  99  99  99  100  100  100  100  73  100  100  -    Capillary Blood Glucose: Lab Results  Component Value Date   GLUCAP 104 (H) 03/30/2022   GLUCAP 127  (H) 03/29/2022   GLUCAP 120 (H) 03/29/2022   GLUCAP 102 (H) 03/29/2022   GLUCAP 131 (H) 03/29/2022     Exercise Target Goals: Exercise Program Goal: Individual exercise prescription set using results from initial 6 min walk test and THRR while considering  patient's activity barriers and safety.   Exercise Prescription Goal: Initial exercise prescription builds to 30-45 minutes a day of aerobic activity, 2-3 days per week.  Home exercise guidelines will be given to patient during program as part of exercise prescription that the participant will acknowledge.  Activity Barriers & Risk Stratification:  Activity Barriers & Cardiac Risk Stratification - 05/26/22 0931       Activity Barriers & Cardiac Risk Stratification   Activity Barriers Deconditioning;Muscular Weakness    Cardiac Risk Stratification Moderate             6 Minute Walk:  6 Minute Walk     Row Name 05/26/22 0832         6 Minute Walk   Phase Initial     Distance 1520 feet     Walk Time 6 minutes     # of Rest Breaks 0     MPH 2.9     METS 4.6     RPE 9     Perceived Dyspnea  0     VO2 Peak 16.23     Symptoms No     Resting HR 75 bpm     Resting BP 112/80     Resting Oxygen Saturation  97 %     Exercise Oxygen Saturation  during 6 min walk 98 %     Max Ex. HR 96 bpm     Max Ex. BP 118/80     2 Minute Post BP 106/72              Oxygen Initial Assessment:   Oxygen Re-Evaluation:   Oxygen Discharge (Final Oxygen Re-Evaluation):   Initial Exercise Prescription:  Initial Exercise Prescription - 05/26/22 0900       Date of Initial Exercise RX and Referring Provider   Date 05/26/22  Referring Provider Truett Mainland, MD    Expected Discharge Date 07/24/22      Treadmill   MPH 3.2    Grade 2    Minutes 15    METs 4.33      Biostep-RELP   Level 2    SPM 60    Minutes 15    METs 4.6      Prescription Details   Frequency (times per week) 3    Duration Progress to 30  minutes of continuous aerobic without signs/symptoms of physical distress      Intensity   THRR 40-80% of Max Heartrate 73-146    Ratings of Perceived Exertion 11-13    Perceived Dyspnea 0-4      Progression   Progression Continue progressive overload as per policy without signs/symptoms or physical distress.      Resistance Training   Training Prescription Yes    Weight 4 lbs    Reps 10-15             Perform Capillary Blood Glucose checks as needed.  Exercise Prescription Changes:   Exercise Comments:   Exercise Goals and Review:   Exercise Goals     Row Name 05/26/22 0935             Exercise Goals   Increase Physical Activity Yes       Intervention Provide advice, education, support and counseling about physical activity/exercise needs.;Develop an individualized exercise prescription for aerobic and resistive training based on initial evaluation findings, risk stratification, comorbidities and participant's personal goals.       Expected Outcomes Short Term: Attend rehab on a regular basis to increase amount of physical activity.;Long Term: Add in home exercise to make exercise part of routine and to increase amount of physical activity.;Long Term: Exercising regularly at least 3-5 days a week.       Increase Strength and Stamina Yes       Intervention Provide advice, education, support and counseling about physical activity/exercise needs.;Develop an individualized exercise prescription for aerobic and resistive training based on initial evaluation findings, risk stratification, comorbidities and participant's personal goals.       Expected Outcomes Short Term: Increase workloads from initial exercise prescription for resistance, speed, and METs.;Short Term: Perform resistance training exercises routinely during rehab and add in resistance training at home;Long Term: Improve cardiorespiratory fitness, muscular endurance and strength as measured by increased METs and  functional capacity ( )       Able to understand and use rate of perceived exertion (RPE) scale Yes       Intervention Provide education and explanation on how to use RPE scale       Expected Outcomes Short Term: Able to use RPE daily in rehab to express subjective intensity level;Long Term:  Able to use RPE to guide intensity level when exercising independently       Knowledge and understanding of Target Heart Rate Range (THRR) Yes       Intervention Provide education and explanation of THRR including how the numbers were predicted and where they are located for reference       Expected Outcomes Short Term: Able to state/look up THRR;Short Term: Able to use daily as guideline for intensity in rehab;Long Term: Able to use THRR to govern intensity when exercising independently       Understanding of Exercise Prescription Yes       Intervention Provide education, explanation, and written materials on patient's individual exercise prescription  Expected Outcomes Short Term: Able to explain program exercise prescription;Long Term: Able to explain home exercise prescription to exercise independently                Exercise Goals Re-Evaluation :   Discharge Exercise Prescription (Final Exercise Prescription Changes):   Nutrition:  Target Goals: Understanding of nutrition guidelines, daily intake of sodium 1500mg , cholesterol 200mg , calories 30% from fat and 7% or less from saturated fats, daily to have 5 or more servings of fruits and vegetables.  Biometrics:  Pre Biometrics - 05/26/22 0800       Pre Biometrics   Waist Circumference 44 inches    Hip Circumference 40 inches    Waist to Hip Ratio 1.1 %    Triceps Skinfold 21 mm    % Body Fat 31.4 %    Grip Strength 46 kg    Flexibility 11.5 in    Single Leg Stand 30 seconds              Nutrition Therapy Plan and Nutrition Goals:   Nutrition Assessments:  MEDIFICTS Score Key: ?70 Need to make dietary changes   40-70 Heart Healthy Diet ? 40 Therapeutic Level Cholesterol Diet    Picture Your Plate Scores: <89 Unhealthy dietary pattern with much room for improvement. 41-50 Dietary pattern unlikely to meet recommendations for good health and room for improvement. 51-60 More healthful dietary pattern, with some room for improvement.  >60 Healthy dietary pattern, although there may be some specific behaviors that could be improved.    Nutrition Goals Re-Evaluation:   Nutrition Goals Re-Evaluation:   Nutrition Goals Discharge (Final Nutrition Goals Re-Evaluation):   Psychosocial: Target Goals: Acknowledge presence or absence of significant depression and/or stress, maximize coping skills, provide positive support system. Participant is able to verbalize types and ability to use techniques and skills needed for reducing stress and depression.  Initial Review & Psychosocial Screening:  Initial Psych Review & Screening - 05/26/22 0927       Initial Review   Current issues with None Identified      Family Dynamics   Good Support System? Yes   Hense has his Mom, ex inlaws, ex wife and girlfirend for support     Barriers   Psychosocial barriers to participate in program There are no identifiable barriers or psychosocial needs.      Screening Interventions   Interventions Encouraged to exercise             Quality of Life Scores:  Quality of Life - 05/26/22 0936       Quality of Life   Select Quality of Life      Quality of Life Scores   Health/Function Pre 22.27 %    Socioeconomic Pre 23.71 %    Psych/Spiritual Pre 20.36 %    Family Pre 23.2 %    GLOBAL Pre 22.31 %            Scores of 19 and below usually indicate a poorer quality of life in these areas.  A difference of  2-3 points is a clinically meaningful difference.  A difference of 2-3 points in the total score of the Quality of Life Index has been associated with significant improvement in overall quality of  life, self-image, physical symptoms, and general health in studies assessing change in quality of life.  PHQ-9: Review Flowsheet       05/26/2022 04/16/2022 01/28/2022 04/05/2017  Depression screen PHQ 2/9  Decreased Interest 0 1 0 0  Down, Depressed, Hopeless 0 1 0 0  PHQ - 2 Score 0 2 0 0  Altered sleeping - 2 - -  Tired, decreased energy - 2 - -  Change in appetite - 0 - -  Feeling bad or failure about yourself  - 1 - -  Trouble concentrating - 1 - -  Moving slowly or fidgety/restless - 0 - -  Suicidal thoughts - 0 - -  PHQ-9 Score - 8 - -  Difficult doing work/chores - Not difficult at all - -   Interpretation of Total Score  Total Score Depression Severity:  1-4 = Minimal depression, 5-9 = Mild depression, 10-14 = Moderate depression, 15-19 = Moderately severe depression, 20-27 = Severe depression   Psychosocial Evaluation and Intervention:   Psychosocial Re-Evaluation:   Psychosocial Discharge (Final Psychosocial Re-Evaluation):   Vocational Rehabilitation: Provide vocational rehab assistance to qualifying candidates.   Vocational Rehab Evaluation & Intervention:  Vocational Rehab - 05/26/22 0819       Initial Vocational Rehab Evaluation & Intervention   Assessment shows need for Vocational Rehabilitation No   Hense will return to work when cleared and does not need vocatinal rehab at this time            Education: Education Goals: Education classes will be provided on a weekly basis, covering required topics. Participant will state understanding/return demonstration of topics presented.     Core Videos: Exercise    Move It!  Clinical staff conducted group or individual video education with verbal and written material and guidebook.  Patient learns the recommended Pritikin exercise program. Exercise with the goal of living a long, healthy life. Some of the health benefits of exercise include controlled diabetes, healthier blood pressure levels, improved  cholesterol levels, improved heart and lung capacity, improved sleep, and better body composition. Everyone should speak with their doctor before starting or changing an exercise routine.  Biomechanical Limitations Clinical staff conducted group or individual video education with verbal and written material and guidebook.  Patient learns how biomechanical limitations can impact exercise and how we can mitigate and possibly overcome limitations to have an impactful and balanced exercise routine.  Body Composition Clinical staff conducted group or individual video education with verbal and written material and guidebook.  Patient learns that body composition (ratio of muscle mass to fat mass) is a key component to assessing overall fitness, rather than body weight alone. Increased fat mass, especially visceral belly fat, can put [place] at increased risk for metabolic syndrome, type 2 diabetes, heart disease, and even death. It is recommended to combine diet and exercise (cardiovascular and resistance training) to improve your body composition. Seek guidance from your physician and exercise physiologist before implementing an exercise routine.  Exercise Action Plan Clinical staff conducted group or individual video education with verbal and written material and guidebook.  Patient learns the recommended strategies to achieve and enjoy long-term exercise adherence, including variety, self-motivation, self-efficacy, and positive decision making. Benefits of exercise include fitness, good health, weight management, more energy, better sleep, less stress, and overall well-being.  Medical   Heart Disease Risk Reduction Clinical staff conducted group or individual video education with verbal and written material and guidebook.  Patient learns our heart is our most vital organ as it circulates oxygen, nutrients, white blood cells, and hormones throughout the entire body, and carries waste away. Data supports a  plant-based eating plan like the Pritikin Program for its effectiveness in slowing progression of and reversing heart disease.  The video provides a number of recommendations to address heart disease.   Metabolic Syndrome and Belly Fat  Clinical staff conducted group or individual video education with verbal and written material and guidebook.  Patient learns what metabolic syndrome is, how it leads to heart disease, and how one can reverse it and keep it from coming back. You have metabolic syndrome if you have 3 of the following 5 criteria: abdominal obesity, high blood pressure, high triglycerides, low HDL cholesterol, and high blood sugar.  Hypertension and Heart Disease Clinical staff conducted group or individual video education with verbal and written material and guidebook.  Patient learns that high blood pressure, or hypertension, is very common in the [place]. Hypertension is largely due to excessive salt intake, but other important risk factors include being overweight, physical inactivity, drinking too much alcohol, smoking, and not eating enough potassium from fruits and vegetables. High blood pressure is a leading risk factor for heart attack, stroke, congestive heart failure, dementia, kidney failure, and premature death. Long-term effects of excessive salt intake include stiffening of the arteries and thickening of heart muscle and organ damage. Recommendations include ways to reduce hypertension and the risk of heart disease.  Diseases of Our Time - Focusing on Diabetes Clinical staff conducted group or individual video education with verbal and written material and guidebook.  Patient learns why the best way to stop diseases of our time is prevention, through food and other lifestyle changes. Medicine (such as prescription pills and surgeries) is often only a Band-Aid on the problem, not a long-term solution. Most common diseases of our time include obesity, type 2 diabetes,  hypertension, heart disease, and cancer. The Pritikin Program is recommended and has been proven to help reduce, reverse, and/or prevent the damaging effects of metabolic syndrome.  Nutrition   Overview of the Pritikin Eating Plan  Clinical staff conducted group or individual video education with verbal and written material and guidebook.  Patient learns about the Pritikin Eating Plan for disease risk reduction. The Pritikin Eating Plan emphasizes a wide variety of unrefined, minimally-processed carbohydrates, like fruits, vegetables, whole grains, and legumes. Go, Caution, and Stop food choices are explained. Plant-based and lean animal proteins are emphasized. Rationale provided for low sodium intake for blood pressure control, low added sugars for blood sugar stabilization, and low added fats and oils for coronary artery disease risk reduction and weight management.  Calorie Density  Clinical staff conducted group or individual video education with verbal and written material and guidebook.  Patient learns about calorie density and how it impacts the Pritikin Eating Plan. Knowing the characteristics of the food you choose will help you decide whether those foods will lead to weight gain or weight loss, and whether you want to consume more or less of them. Weight loss is usually a side effect of the Pritikin Eating Plan because of its focus on low calorie-dense foods.  Label Reading  Clinical staff conducted group or individual video education with verbal and written material and guidebook.  Patient learns about the Pritikin recommended label reading guidelines and corresponding recommendations regarding calorie density, added sugars, sodium content, and whole grains.  Dining Out - Part 1  Clinical staff conducted group or individual video education with verbal and written material and guidebook.  Patient learns that restaurant meals can be sabotaging because they can be so high in calories, fat,  sodium, and/or sugar. Patient learns recommended strategies on how to positively address this and avoid unhealthy pitfalls.  Facts on Fats  Clinical staff conducted group or individual video education with verbal and written material and guidebook.  Patient learns that lifestyle modifications can be just as effective, if not more so, as many medications for lowering your risk of heart disease. A Pritikin lifestyle can help to reduce your risk of inflammation and atherosclerosis (cholesterol build-up, or plaque, in the artery walls). Lifestyle interventions such as dietary choices and physical activity address the cause of atherosclerosis. A review of the types of fats and their impact on blood cholesterol levels, along with dietary recommendations to reduce fat intake is also included.  Nutrition Action Plan  Clinical staff conducted group or individual video education with verbal and written material and guidebook.  Patient learns how to incorporate Pritikin recommendations into their lifestyle. Recommendations include planning and keeping personal health goals in mind as an important part of their success.  Healthy Mind-Set    Healthy Minds, Bodies, Hearts  Clinical staff conducted group or individual video education with verbal and written material and guidebook.  Patient learns how to identify when they are stressed. Video will discuss the impact of that stress, as well as the many benefits of stress management. Patient will also be introduced to stress management techniques. The way we think, act, and feel has an impact on our hearts.  How Our Thoughts Can Heal Our Hearts  Clinical staff conducted group or individual video education with verbal and written material and guidebook.  Patient learns that negative thoughts can cause depression and anxiety. This can result in negative lifestyle behavior and serious health problems. Cognitive behavioral therapy is an effective method to help control  our thoughts in order to change and improve our emotional outlook.  Additional Videos:  Exercise    Improving Performance  Clinical staff conducted group or individual video education with verbal and written material and guidebook.  Patient learns to use a non-linear approach by alternating intensity levels and lengths of time spent exercising to help burn more calories and lose more body fat. Cardiovascular exercise helps improve heart health, metabolism, hormonal balance, blood sugar control, and recovery from fatigue. Resistance training improves strength, endurance, balance, coordination, reaction time, metabolism, and muscle mass. Flexibility exercise improves circulation, posture, and balance. Seek guidance from your physician and exercise physiologist before implementing an exercise routine and learn your capabilities and proper form for all exercise.  Introduction to Yoga  Clinical staff conducted group or individual video education with verbal and written material and guidebook.  Patient learns about yoga, a discipline of the coming together of mind, breath, and body. The benefits of yoga include improved flexibility, improved range of motion, better posture and core strength, increased lung function, weight loss, and positive self-image. Yoga's heart health benefits include lowered blood pressure, healthier heart rate, decreased cholesterol and triglyceride levels, improved immune function, and reduced stress. Seek guidance from your physician and exercise physiologist before implementing an exercise routine and learn your capabilities and proper form for all exercise.  Medical   Aging: Enhancing Your Quality of Life  Clinical staff conducted group or individual video education with verbal and written material and guidebook.  Patient learns key strategies and recommendations to stay in good physical health and enhance quality of life, such as prevention strategies, having an advocate,  securing a Health Care Proxy and Power of Attorney, and keeping a list of medications and system for tracking them. It also discusses how to avoid risk for bone loss.  Biology of Weight Control  Clinical staff conducted group or individual video education with verbal and written material and guidebook.  Patient learns that weight gain occurs because we consume more calories than we burn (eating more, moving less). Even if your body weight is normal, you may have higher ratios of fat compared to muscle mass. Too much body fat puts you at increased risk for cardiovascular disease, heart attack, stroke, type 2 diabetes, and obesity-related cancers. In addition to exercise, following the Pritikin Eating Plan can help reduce your risk.  Decoding Lab Results  Clinical staff conducted group or individual video education with verbal and written material and guidebook.  Patient learns that lab test reflects one measurement whose values change over time and are influenced by many factors, including medication, stress, sleep, exercise, food, hydration, pre-existing medical conditions, and more. It is recommended to use the knowledge from this video to become more involved with your lab results and evaluate your numbers to speak with your doctor.   Diseases of Our Time - Overview  Clinical staff conducted group or individual video education with verbal and written material and guidebook.  Patient learns that according to the CDC, 50% to 70% of chronic diseases (such as obesity, type 2 diabetes, elevated lipids, hypertension, and heart disease) are avoidable through lifestyle improvements including healthier food choices, listening to satiety cues, and increased physical activity.  Sleep Disorders Clinical staff conducted group or individual video education with verbal and written material and guidebook.  Patient learns how good quality and duration of sleep are important to overall health and well-being.  Patient also learns about sleep disorders and how they impact health along with recommendations to address them, including discussing with a physician.  Nutrition  Dining Out - Part 2 Clinical staff conducted group or individual video education with verbal and written material and guidebook.  Patient learns how to plan ahead and communicate in order to maximize their dining experience in a healthy and nutritious manner. Included are recommended food choices based on the type of restaurant the patient is visiting.   Fueling a Banker conducted group or individual video education with verbal and written material and guidebook.  There is a strong connection between our food choices and our health. Diseases like obesity and type 2 diabetes are very prevalent and are in large-part due to lifestyle choices. The Pritikin Eating Plan provides plenty of food and hunger-curbing satisfaction. It is easy to follow, affordable, and helps reduce health risks.  Menu Workshop  Clinical staff conducted group or individual video education with verbal and written material and guidebook.  Patient learns that restaurant meals can sabotage health goals because they are often packed with calories, fat, sodium, and sugar. Recommendations include strategies to plan ahead and to communicate with the manager, chef, or server to help order a healthier meal.  Planning Your Eating Strategy  Clinical staff conducted group or individual video education with verbal and written material and guidebook.  Patient learns about the Pritikin Eating Plan and its benefit of reducing the risk of disease. The Pritikin Eating Plan does not focus on calories. Instead, it emphasizes high-quality, nutrient-rich foods. By knowing the characteristics of the foods, we choose, we can determine their calorie density and make informed decisions.  Targeting Your Nutrition Priorities  Clinical staff conducted group or individual  video education with verbal and written material and guidebook.  Patient learns that lifestyle habits have a tremendous impact on disease risk and progression. This video provides eating  and physical activity recommendations based on your personal health goals, such as reducing LDL cholesterol, losing weight, preventing or controlling type 2 diabetes, and reducing high blood pressure.  Vitamins and Minerals  Clinical staff conducted group or individual video education with verbal and written material and guidebook.  Patient learns different ways to obtain key vitamins and minerals, including through a recommended healthy diet. It is important to discuss all supplements you take with your doctor.   Healthy Mind-Set    Smoking Cessation  Clinical staff conducted group or individual video education with verbal and written material and guidebook.  Patient learns that cigarette smoking and tobacco addiction pose a serious health risk which affects millions of people. Stopping smoking will significantly reduce the risk of heart disease, lung disease, and many forms of cancer. Recommended strategies for quitting are covered, including working with your doctor to develop a successful plan.  Culinary   Becoming a Set designer conducted group or individual video education with verbal and written material and guidebook.  Patient learns that cooking at home can be healthy, cost-effective, quick, and puts them in control. Keys to cooking healthy recipes will include looking at your recipe, assessing your equipment needs, planning ahead, making it simple, choosing cost-effective seasonal ingredients, and limiting the use of added fats, salts, and sugars.  Cooking - Breakfast and Snacks  Clinical staff conducted group or individual video education with verbal and written material and guidebook.  Patient learns how important breakfast is to satiety and nutrition through the entire day.  Recommendations include key foods to eat during breakfast to help stabilize blood sugar levels and to prevent overeating at meals later in the day. Planning ahead is also a key component.  Cooking - Educational psychologist conducted group or individual video education with verbal and written material and guidebook.  Patient learns eating strategies to improve overall health, including an approach to cook more at home. Recommendations include thinking of animal protein as a side on your plate rather than center stage and focusing instead on lower calorie dense options like vegetables, fruits, whole grains, and plant-based proteins, such as beans. Making sauces in large quantities to freeze for later and leaving the skin on your vegetables are also recommended to maximize your experience.  Cooking - Healthy Salads and Dressing Clinical staff conducted group or individual video education with verbal and written material and guidebook.  Patient learns that vegetables, fruits, whole grains, and legumes are the foundations of the Pritikin Eating Plan. Recommendations include how to incorporate each of these in flavorful and healthy salads, and how to create homemade salad dressings. Proper handling of ingredients is also covered. Cooking - Soups and [employer] - Soups and Desserts Clinical staff conducted group or individual video education with verbal and written material and guidebook.  Patient learns that Pritikin soups and desserts make for easy, nutritious, and delicious snacks and meal components that are low in sodium, fat, sugar, and calorie density, while high in vitamins, minerals, and filling fiber. Recommendations include simple and healthy ideas for soups and desserts.   Overview     The Pritikin Solution Program Overview Clinical staff conducted group or individual video education with verbal and written material and guidebook.  Patient learns that the results of the Pritikin  Program have been documented in more than 100 articles published in peer-reviewed journals, and the benefits include reducing risk factors for (and, in some cases, even reversing) high cholesterol,  high blood pressure, type 2 diabetes, obesity, and more! An overview of the three key pillars of the Pritikin Program will be covered: eating well, doing regular exercise, and having a healthy mind-set.  WORKSHOPS  Exercise: Exercise Basics: Building Your Action Plan Clinical staff led group instruction and group discussion with PowerPoint presentation and patient guidebook. To enhance the learning environment the use of posters, models and videos may be added. At the conclusion of this workshop, patients will comprehend the difference between physical activity and exercise, as well as the benefits of incorporating both, into their routine. Patients will understand the FITT (Frequency, Intensity, Time, and Type) principle and how to use it to build an exercise action plan. In addition, safety concerns and other considerations for exercise and cardiac rehab will be addressed by the presenter. The purpose of this lesson is to promote a comprehensive and effective weekly exercise routine in order to improve patients' overall level of fitness.   Managing Heart Disease: Your Path to a Healthier Heart Clinical staff led group instruction and group discussion with PowerPoint presentation and patient guidebook. To enhance the learning environment the use of posters, models and videos may be added.At the conclusion of this workshop, patients will understand the anatomy and physiology of the heart. Additionally, they will understand how Pritikin's three pillars impact the risk factors, the progression, and the management of heart disease.  The purpose of this lesson is to provide a high-level overview of the heart, heart disease, and how the Pritikin lifestyle positively impacts risk factors.  Exercise  Biomechanics Clinical staff led group instruction and group discussion with PowerPoint presentation and patient guidebook. To enhance the learning environment the use of posters, models and videos may be added. Patients will learn how the structural parts of their bodies function and how these functions impact their daily activities, movement, and exercise. Patients will learn how to promote a neutral spine, learn how to manage pain, and identify ways to improve their physical movement in order to promote healthy living. The purpose of this lesson is to expose patients to common physical limitations that impact physical activity. Participants will learn practical ways to adapt and manage aches and pains, and to minimize their effect on regular exercise. Patients will learn how to maintain good posture while sitting, walking, and lifting.  Balance Training and Fall Prevention  Clinical staff led group instruction and group discussion with PowerPoint presentation and patient guidebook. To enhance the learning environment the use of posters, models and videos may be added. At the conclusion of this workshop, patients will understand the importance of their sensorimotor skills (vision, proprioception, and the vestibular system) in maintaining their ability to balance as they age. Patients will apply a variety of balancing exercises that are appropriate for their current level of function. Patients will understand the common causes for poor balance, possible solutions to these problems, and ways to modify their physical environment in order to minimize their fall risk. The purpose of this lesson is to teach patients about the importance of maintaining balance as they age and ways to minimize their risk of falling.  WORKSHOPS   Nutrition:  Fueling a Ship broker led group instruction and group discussion with PowerPoint presentation and patient guidebook. To enhance the learning  environment the use of posters, models and videos may be added. Patients will review the foundational principles of the Pritikin Eating Plan and understand what constitutes a serving size in each of the food groups. Patients will  also learn Pritikin-friendly foods that are better choices when away from home and review make-ahead meal and snack options. Calorie density will be reviewed and applied to three nutrition priorities: weight maintenance, weight loss, and weight gain. The purpose of this lesson is to reinforce (in a group setting) the key concepts around what patients are recommended to eat and how to apply these guidelines when away from home by planning and selecting Pritikin-friendly options. Patients will understand how calorie density may be adjusted for different weight management goals.  Mindful Eating  Clinical staff led group instruction and group discussion with PowerPoint presentation and patient guidebook. To enhance the learning environment the use of posters, models and videos may be added. Patients will briefly review the concepts of the Pritikin Eating Plan and the importance of low-calorie dense foods. The concept of mindful eating will be introduced as well as the importance of paying attention to internal hunger signals. Triggers for non-hunger eating and techniques for dealing with triggers will be explored. The purpose of this lesson is to provide patients with the opportunity to review the basic principles of the Pritikin Eating Plan, discuss the value of eating mindfully and how to measure internal cues of hunger and fullness using the Hunger Scale. Patients will also discuss reasons for non-hunger eating and learn strategies to use for controlling emotional eating.  Targeting Your Nutrition Priorities Clinical staff led group instruction and group discussion with PowerPoint presentation and patient guidebook. To enhance the learning environment the use of posters, models and  videos may be added. Patients will learn how to determine their genetic susceptibility to disease by reviewing their family history. Patients will gain insight into the importance of diet as part of an overall healthy lifestyle in mitigating the impact of genetics and other environmental insults. The purpose of this lesson is to provide patients with the opportunity to assess their personal nutrition priorities by looking at their family history, their own health history and current risk factors. Patients will also be able to discuss ways of prioritizing and modifying the Pritikin Eating Plan for their highest risk areas  Menu  Clinical staff led group instruction and group discussion with PowerPoint presentation and patient guidebook. To enhance the learning environment the use of posters, models and videos may be added. Using menus brought in from [employer] restaurants, or printed from [employer] menus, patients will apply the Pritikin dining out guidelines that were presented in the [employer] Dining Out educational video. Patients will also be able to practice these guidelines in a variety of provided scenarios. The purpose of this lesson is to provide patients with the opportunity to practice hands-on learning of the Pritikin Dining Out guidelines with actual menus and practice scenarios.  Label Reading Clinical staff led group instruction and group discussion with PowerPoint presentation and patient guidebook. To enhance the learning environment the use of posters, models and videos may be added. Patients will review and discuss the Pritikin label reading guidelines presented in Pritikin's Label Reading Educational series video. Using fool labels brought in from local grocery stores and markets, patients will apply the label reading guidelines and determine if the packaged food meet the Pritikin guidelines. The purpose of this lesson is to provide patients with the opportunity to review, discuss, and practice  hands-on learning of the Pritikin Label Reading guidelines with actual packaged food labels. Cooking School  Pritikin's [employer]ing School Workshops are designed to teach patients ways to prepare quick, simple, and affordable recipes at home. The importance  of nutrition's role in chronic disease risk reduction is reflected in its emphasis in the overall Pritikin program. By learning how to prepare essential core Pritikin Eating Plan recipes, patients will increase control over what they eat; be able to customize the flavor of foods without the use of added salt, sugar, or fat; and improve the quality of the food they consume. By learning a set of core recipes which are easily assembled, quickly prepared, and affordable, patients are more likely to prepare more healthy foods at home. These workshops focus on convenient breakfasts, simple entres, side dishes, and desserts which can be prepared with minimal effort and are consistent with nutrition recommendations for cardiovascular risk reduction. Cooking [employer] are taught by a Armed forces logistics/support/administrative officer (RD) who has been trained by the [employer]. The chef or RD has a clear understanding of the importance of minimizing - if not completely eliminating - added fat, sugar, and sodium in recipes. Throughout the series of Cooking School Workshop sessions, patients will learn about healthy ingredients and efficient methods of cooking to build confidence in their capability to prepare    Cooking School weekly topics:  Adding Flavor- Sodium-Free  Fast and Healthy Breakfasts  Powerhouse Plant-Based Proteins  Satisfying Salads and Dressings  Simple Sides and Sauces  International Cuisine-Spotlight on the [employer] Zones  Delicious Desserts  Savory Soups  Efficiency Cooking - Meals in a Snap  Tasty Appetizers and Snacks  Comforting Weekend Breakfasts  One-Pot Wonders   Fast Evening Meals  Easy Entertaining  Personalizing Your Pritikin  Plate  WORKSHOPS   Healthy Mindset (Psychosocial): New Thoughts, New Behaviors Clinical staff led group instruction and group discussion with PowerPoint presentation and patient guidebook. To enhance the learning environment the use of posters, models and videos may be added. Patients will learn and practice techniques for developing effective health and lifestyle goals. Patients will be able to effectively apply the goal setting process learned to develop at least one new personal goal.  The purpose of this lesson is to expose patients to a new skill set of behavior modification techniques such as techniques setting SMART goals, overcoming barriers, and achieving new thoughts and new behaviors.  Managing Moods and Relationships Clinical staff led group instruction and group discussion with PowerPoint presentation and patient guidebook. To enhance the learning environment the use of posters, models and videos may be added. Patients will learn how emotional and chronic stress factors can impact their health and relationships. They will learn healthy ways to manage their moods and utilize positive coping mechanisms. In addition, ICR patients will learn ways to improve communication skills. The purpose of this lesson is to expose patients to ways of understanding how one's mood and health are intimately connected. Developing a healthy outlook can help build positive relationships and connections with others. Patients will understand the importance of utilizing effective communication skills that include actively listening and being heard. They will learn and understand the importance of the "4 Cs" and especially Connections in fostering of a Healthy Mind-Set.  Healthy Sleep for a Healthy Heart Clinical staff led group instruction and group discussion with PowerPoint presentation and patient guidebook. To enhance the learning environment the use of posters, models and videos may be added. At the conclusion  of this workshop, patients will be able to demonstrate knowledge of the importance of sleep to overall health, well-being, and quality of life. They will understand the symptoms of, and treatments for, common sleep disorders. Patients will also  be able to identify daytime and nighttime behaviors which impact sleep, and they will be able to apply these tools to help manage sleep-related challenges. The purpose of this lesson is to provide patients with a general overview of sleep and outline the importance of quality sleep. Patients will learn about a few of the most common sleep disorders. Patients will also be introduced to the concept of "sleep hygiene," and discover ways to self-manage certain sleeping problems through simple daily behavior changes. Finally, the workshop will motivate patients by clarifying the links between quality sleep and their goals of heart-healthy living.   Recognizing and Reducing Stress Clinical staff led group instruction and group discussion with PowerPoint presentation and patient guidebook. To enhance the learning environment the use of posters, models and videos may be added. At the conclusion of this workshop, patients will be able to understand the types of stress reactions, differentiate between acute and chronic stress, and recognize the impact that chronic stress has on their health. They will also be able to apply different coping mechanisms, such as reframing negative self-talk. Patients will have the opportunity to practice a variety of stress management techniques, such as deep abdominal breathing, progressive muscle relaxation, and/or guided imagery.  The purpose of this lesson is to educate patients on the role of stress in their lives and to provide healthy techniques for coping with it.  Learning Barriers/Preferences:  Learning Barriers/Preferences - 05/26/22 0865       Learning Barriers/Preferences   Learning Barriers Sight   wears glasses   Learning  Preferences Written Material;Video;Computer/Internet;Pictoral             Education Topics:  Knowledge Questionnaire Score:  Knowledge Questionnaire Score - 05/26/22 0939       Knowledge Questionnaire Score   Pre Score 21/24             Core Components/Risk Factors/Patient Goals at Admission:  Personal Goals and Risk Factors at Admission - 05/26/22 0939       Core Components/Risk Factors/Patient Goals on Admission    Weight Management Yes;Obesity;Weight Loss    Intervention Weight Management: Develop a combined nutrition and exercise program designed to reach desired caloric intake, while maintaining appropriate intake of nutrient and fiber, sodium and fats, and appropriate energy expenditure required for the weight goal.;Weight Management: Provide education and appropriate resources to help participant work on and attain dietary goals.;Weight Management/Obesity: Establish reasonable short term and long term weight goals.;Obesity: Provide education and appropriate resources to help participant work on and attain dietary goals.    Admit Weight 222 lb 3.6 oz (100.8 kg)    Goal Weight: Long Term 195 lb (88.5 kg)   pt goal   Expected Outcomes Short Term: Continue to assess and modify interventions until short term weight is achieved;Long Term: Adherence to nutrition and physical activity/exercise program aimed toward attainment of established weight goal;Weight Maintenance: Understanding of the daily nutrition guidelines, which includes 25-35% calories from fat, 7% or less cal from saturated fats, less than  cholesterol, less than 1.5gm of sodium, & 5 or more servings of fruits and vegetables daily;Weight Loss: Understanding of general recommendations for a balanced deficit meal plan, which promotes 1-2 lb weight loss per week and includes a negative energy balance of [phone] kcal/d;Understanding recommendations for meals to include 15-35% energy as protein, 25-35% energy from fat,  35-60% energy from carbohydrates, less than  of dietary cholesterol, 20-35 gm of total fiber daily;Understanding of distribution of calorie intake throughout the day with  the consumption of 4-5 meals/snacks    Heart Failure Yes    Intervention Provide a combined exercise and nutrition program that is supplemented with education, support and counseling about heart failure. Directed toward relieving symptoms such as shortness of breath, decreased exercise tolerance, and extremity edema.    Expected Outcomes Short term: Attendance in program 2-3 days a week with increased exercise capacity. Reported lower sodium intake. Reported increased fruit and vegetable intake. Reports medication compliance.;Short term: Daily weights obtained and reported for increase. Utilizing diuretic protocols set by physician.;Long term: Adoption of self-care skills and reduction of barriers for early signs and symptoms recognition and intervention leading to self-care maintenance.    Hypertension Yes    Intervention Provide education on lifestyle modifcations including regular physical activity/exercise, weight management, moderate sodium restriction and increased consumption of fresh fruit, vegetables, and low fat dairy, alcohol moderation, and smoking cessation.;Monitor prescription use compliance.    Expected Outcomes Short Term: Continued assessment and intervention until BP is < 140/72mm HG in hypertensive participants. < 130/81mm HG in hypertensive participants with diabetes, heart failure or chronic kidney disease.;Long Term: Maintenance of blood pressure at goal levels.    Lipids Yes    Intervention Provide education and support for participant on nutrition & aerobic/resistive exercise along with prescribed medications to achieve LDL 70mg , HDL >40mg .    Expected Outcomes Short Term: Participant states understanding of desired cholesterol values and is compliant with medications prescribed. Participant is following  exercise prescription and nutrition guidelines.;Long Term: Cholesterol controlled with medications as prescribed, with individualized exercise RX and with personalized nutrition plan. Value goals: LDL < 70mg , HDL > 40 mg.             Core Components/Risk Factors/Patient Goals Review:    Core Components/Risk Factors/Patient Goals at Discharge (Final Review):    ITP Comments:  ITP Comments     Row Name 05/26/22 0815           ITP Comments Dr Armanda Magic MD, Medical Dire[employer]r, Introduction to [facility]/ Intensive Cardiac Rehab. Initial Orientation Packet Reviewed with the patient                Comments: Participant attended orientation for the cardiac rehabilitation program on  05/26/2022  to perform initial intake and exercise walk test. Patient introduced to the [facility] education and orientation packet was reviewed. Completed 6-minute walk test, measurements, initial ITP, and exercise prescription. Vital signs stable. Telemetry-normal sinus rhythm, asymptomatic. Thayer Headings RN BSN   Service time was from [phone] to [phone].

## 2022-05-26 NOTE — Progress Notes (Signed)
CPAP ordered. Faxing to [facility] 

## 2022-05-29 ENCOUNTER — Other Ambulatory Visit (HOSPITAL_COMMUNITY): Payer: Self-pay

## 2022-06-01 ENCOUNTER — Other Ambulatory Visit (HOSPITAL_COMMUNITY): Payer: Self-pay

## 2022-06-01 ENCOUNTER — Encounter (HOSPITAL_COMMUNITY)
Admission: RE | Admit: 2022-06-01 | Discharge: 2022-06-01 | Disposition: A | Discharge status: Home / Self Care | Payer: 59 | Source: Ambulatory Visit | Attending: Cardiology | Admitting: Cardiology

## 2022-06-01 DIAGNOSIS — I252 Old myocardial infarction: Secondary | ICD-10-CM | POA: Diagnosis not present

## 2022-06-01 DIAGNOSIS — Z951 Presence of aortocoronary bypass graft: Secondary | ICD-10-CM

## 2022-06-01 DIAGNOSIS — I2102 ST elevation (STEMI) myocardial infarction involving left anterior descending coronary artery: Secondary | ICD-10-CM

## 2022-06-01 NOTE — Progress Notes (Signed)
Daily Session Note  Patient Details  Name: Michael Hart MRN: [id number] Date of Birth: [birth year] Referring Provider:   Flowsheet Row INTENSIVE CARDIAC REHAB ORIENT from 05/26/2022 in [facility]  Referring Provider Vernell Leep, MD       Encounter Date: 06/01/2022  Check In:  Session Check In - 06/01/22 0855       Check-In   Supervising physician immediately available to respond to emergencies [facility] - Physician supervision    Physician(s) Dr. Percival Spanish    Location [facility]    Staff Present Barnet Pall, RN, Milus Glazier, MS, ACSM-CEP, CCRP, Exercise Physiologist;Johnny Starleen Blue, MS, Exercise Physiologist;Jetta Gilford Rile BS, ACSM-CEP, Exercise Physiologist;Olinty Celesta Aver, MS, ACSM-CEP, Exercise Physiologist;Samantha [place], RD, LDN    Virtual Visit No    Medication changes reported     No    Fall or balance concerns reported    No    Tobacco Cessation Use Increase    Warm-up and Cool-down Performed as group-led instruction    Resistance Training Performed Yes    VAD Patient? No    PAD/SET Patient? No      Pain Assessment   Currently in Pain? No/denies    Pain Score 0-No pain    Multiple Pain Sites No             Capillary Blood Glucose: No results found for this or any previous visit (from the past 24 hour(s)).   Exercise Prescription Changes - 06/01/22 1000       Response to Exercise   Blood Pressure (Admit) 116/68    Blood Pressure (Exercise) 144/80    Blood Pressure (Exit) 110/78    Heart Rate (Admit) 78 bpm    Heart Rate (Exercise) 109 bpm    Heart Rate (Exit) 82 bpm    Rating of Perceived Exertion (Exercise) 12    Symptoms None    Comments Pt's firast day in the CRP2 program    Duration Continue with 30 min of aerobic exercise without signs/symptoms of physical distress.    Intensity THRR unchanged      Progression   Progression Continue to progress workloads to  maintain intensity without signs/symptoms of physical distress.    Average METs 3.86      Resistance Training   Training Prescription Yes    Weight 4 lbs    Reps 10-15    Time 10 Minutes      Interval Training   Interval Training No      Treadmill   MPH 2.8    Grade 2    Minutes 15    METs 3.91      Biostep-RELP   Level 4    Minutes 15    METs 3.8             Social History   Tobacco Use  Smoking Status Former   Packs/day: 0.50   Years: 12.00   Total pack years: 6.00   Types: Cigarettes   Quit date: 2013   Years since quitting: 10.9  Smokeless Tobacco Never    Goals Met:  Exercise tolerated well No report of concerns or symptoms today Strength training completed today  Goals Unmet:  Not Applicable  Comments: Pt started cardiac rehab today.  Pt tolerated light exercise without difficulty. VSS, telemetry-Sinus Rhythm, asymptomatic.  Medication list reconciled. Pt denies barriers to medicaiton compliance.  PSYCHOSOCIAL ASSESSMENT:  PHQ-0. Pt exhibits positive coping skills, hopeful outlook with supportive family.  No psychosocial needs identified at this time, no psychosocial interventions necessary.    Pt enjoys golf, running, watching sports and listening to music.   Pt oriented to exercise equipment and routine.    Understanding verbalized. Harrell Gave RN BSN    Dr. Fransico Him is Medical Director for Cardiac Rehab at [facility].

## 2022-06-03 ENCOUNTER — Encounter (HOSPITAL_COMMUNITY)
Admission: RE | Admit: 2022-06-03 | Discharge: 2022-06-03 | Disposition: A | Discharge status: Home / Self Care | Payer: 59 | Source: Ambulatory Visit | Attending: Cardiology | Admitting: Cardiology

## 2022-06-03 DIAGNOSIS — Z951 Presence of aortocoronary bypass graft: Secondary | ICD-10-CM

## 2022-06-03 DIAGNOSIS — I252 Old myocardial infarction: Secondary | ICD-10-CM | POA: Diagnosis not present

## 2022-06-03 DIAGNOSIS — I2102 ST elevation (STEMI) myocardial infarction involving left anterior descending coronary artery: Secondary | ICD-10-CM

## 2022-06-03 NOTE — Addendum Note (Signed)
Addended by: Marinus Maw on: 06/03/2022 10:03 AM   Modules accepted: Orders

## 2022-06-05 ENCOUNTER — Encounter (HOSPITAL_COMMUNITY): Payer: 59

## 2022-06-08 ENCOUNTER — Encounter (HOSPITAL_COMMUNITY)
Admission: RE | Admit: 2022-06-08 | Discharge: 2022-06-08 | Disposition: A | Discharge status: Home / Self Care | Payer: 59 | Source: Ambulatory Visit | Attending: Cardiology | Admitting: Cardiology

## 2022-06-08 DIAGNOSIS — Z951 Presence of aortocoronary bypass graft: Secondary | ICD-10-CM

## 2022-06-08 DIAGNOSIS — I2102 ST elevation (STEMI) myocardial infarction involving left anterior descending coronary artery: Secondary | ICD-10-CM

## 2022-06-08 DIAGNOSIS — I252 Old myocardial infarction: Secondary | ICD-10-CM | POA: Diagnosis not present

## 2022-06-10 ENCOUNTER — Encounter (HOSPITAL_COMMUNITY)
Admission: RE | Admit: 2022-06-10 | Discharge: 2022-06-10 | Disposition: A | Discharge status: Home / Self Care | Payer: 59 | Source: Ambulatory Visit | Attending: Cardiology | Admitting: Cardiology

## 2022-06-10 DIAGNOSIS — I252 Old myocardial infarction: Secondary | ICD-10-CM | POA: Diagnosis not present

## 2022-06-10 DIAGNOSIS — I2102 ST elevation (STEMI) myocardial infarction involving left anterior descending coronary artery: Secondary | ICD-10-CM

## 2022-06-10 DIAGNOSIS — Z951 Presence of aortocoronary bypass graft: Secondary | ICD-10-CM

## 2022-06-12 ENCOUNTER — Encounter (HOSPITAL_COMMUNITY)
Admission: RE | Admit: 2022-06-12 | Discharge: 2022-06-12 | Disposition: A | Discharge status: Home / Self Care | Payer: 59 | Source: Ambulatory Visit | Attending: Cardiology | Admitting: Cardiology

## 2022-06-12 ENCOUNTER — Encounter (HOSPITAL_COMMUNITY): Payer: 59

## 2022-06-12 DIAGNOSIS — I2102 ST elevation (STEMI) myocardial infarction involving left anterior descending coronary artery: Secondary | ICD-10-CM

## 2022-06-12 DIAGNOSIS — I252 Old myocardial infarction: Secondary | ICD-10-CM | POA: Diagnosis not present

## 2022-06-12 DIAGNOSIS — Z951 Presence of aortocoronary bypass graft: Secondary | ICD-10-CM

## 2022-06-17 ENCOUNTER — Encounter (HOSPITAL_COMMUNITY)
Admission: RE | Admit: 2022-06-17 | Discharge: 2022-06-17 | Disposition: A | Discharge status: Home / Self Care | Payer: 59 | Source: Ambulatory Visit | Attending: Cardiology | Admitting: Cardiology

## 2022-06-17 DIAGNOSIS — I2102 ST elevation (STEMI) myocardial infarction involving left anterior descending coronary artery: Secondary | ICD-10-CM

## 2022-06-17 DIAGNOSIS — I252 Old myocardial infarction: Secondary | ICD-10-CM | POA: Diagnosis not present

## 2022-06-17 DIAGNOSIS — Z951 Presence of aortocoronary bypass graft: Secondary | ICD-10-CM

## 2022-06-19 ENCOUNTER — Encounter (HOSPITAL_COMMUNITY)
Admission: RE | Admit: 2022-06-19 | Discharge: 2022-06-19 | Disposition: A | Discharge status: Home / Self Care | Payer: 59 | Source: Ambulatory Visit | Attending: Cardiology | Admitting: Cardiology

## 2022-06-19 DIAGNOSIS — Z951 Presence of aortocoronary bypass graft: Secondary | ICD-10-CM

## 2022-06-19 DIAGNOSIS — I2102 ST elevation (STEMI) myocardial infarction involving left anterior descending coronary artery: Secondary | ICD-10-CM

## 2022-06-19 DIAGNOSIS — I252 Old myocardial infarction: Secondary | ICD-10-CM | POA: Diagnosis not present

## 2022-06-19 IMAGING — DX DG CHEST 1V
1 series · 1 of 1 positions shown · non-contrast
Comparison: CT Abdomen and Pelvis 04/12/2017.

CLINICAL DATA: 35-year-old male undergoing physical exam.

EXAM:
CHEST  1 VIEW

[chest pa]
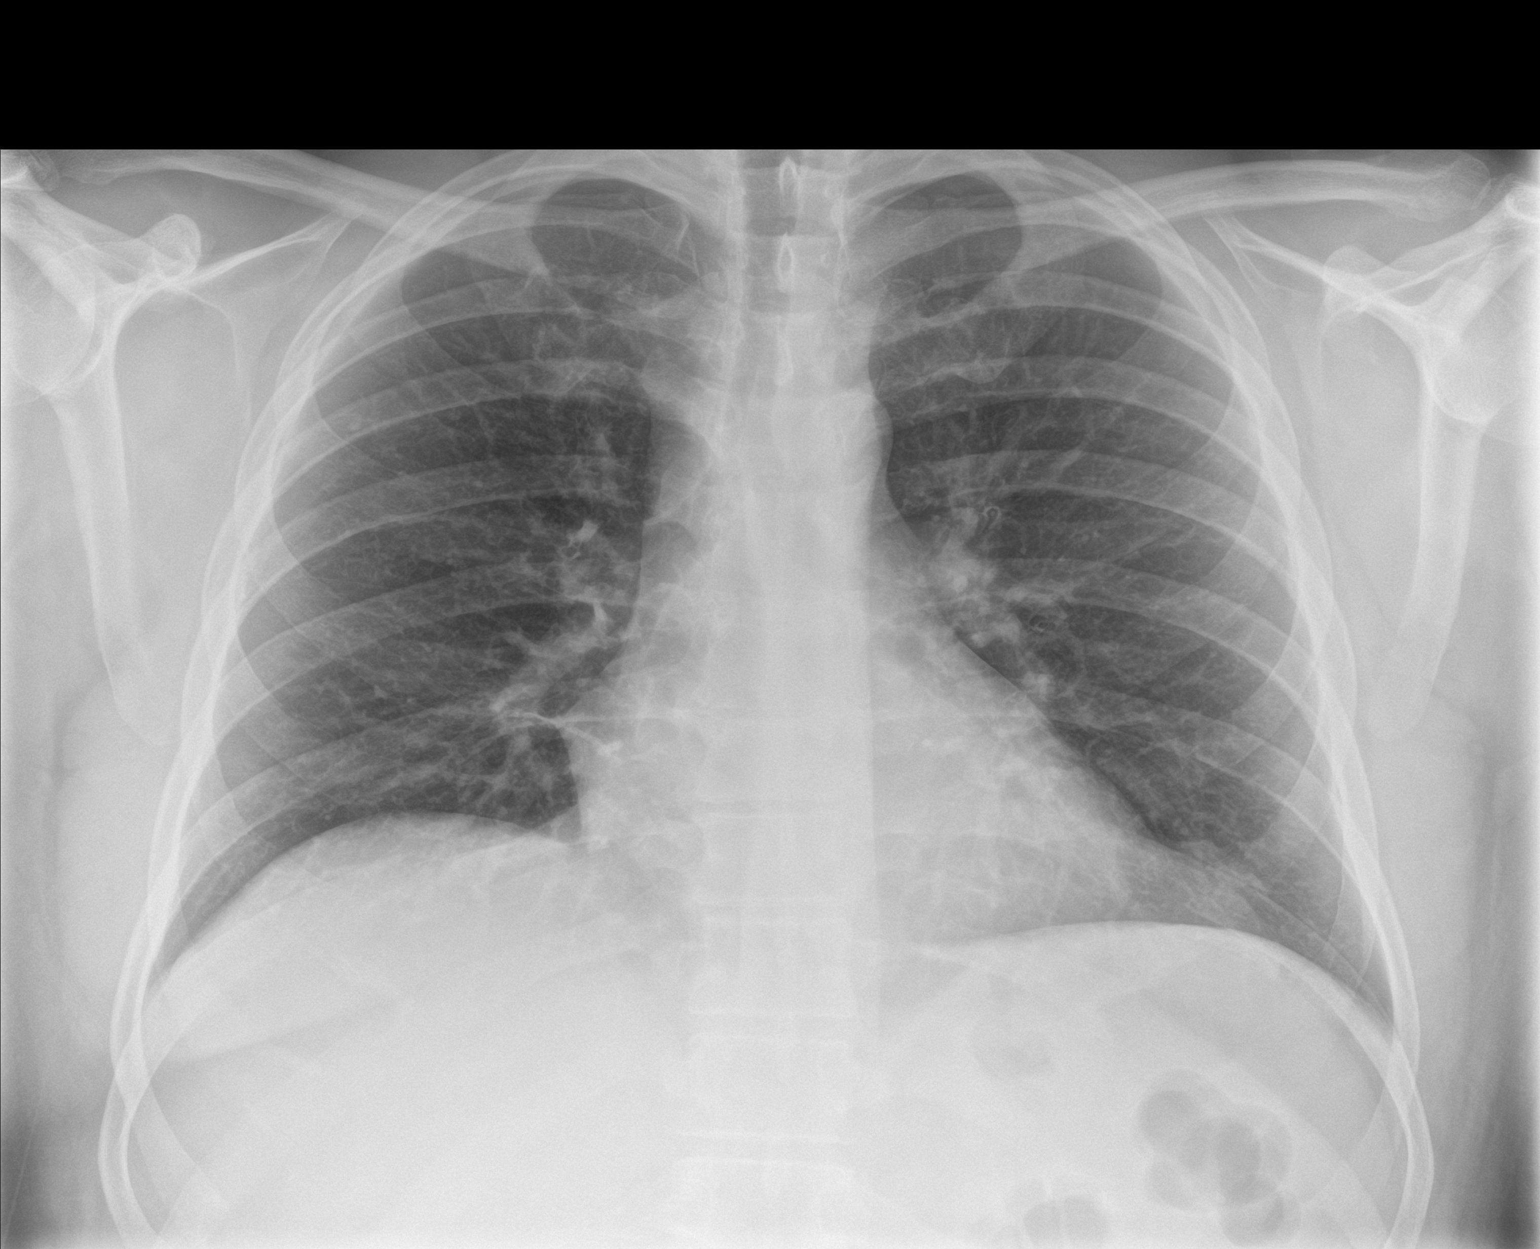

[1 of 1 positions shown; findings below may reference images not displayed]

FINDINGS: PA view. Normal lung volumes and mediastinal contours. Visualized
tracheal air column is within normal limits. Lung markings are
within normal limits, both lungs appear clear. No pneumothorax or
pleural effusion. Negative visible bowel gas and osseous structures.
IMPRESSION: Negative.  No cardiopulmonary abnormality.

## 2022-06-23 ENCOUNTER — Ambulatory Visit: Payer: 59 | Admitting: Internal Medicine

## 2022-06-23 ENCOUNTER — Encounter: Payer: Self-pay | Admitting: Thoracic Surgery (Cardiothoracic Vascular Surgery)

## 2022-06-23 ENCOUNTER — Ambulatory Visit (INDEPENDENT_AMBULATORY_CARE_PROVIDER_SITE_OTHER): Payer: Self-pay | Admitting: Thoracic Surgery (Cardiothoracic Vascular Surgery)

## 2022-06-23 ENCOUNTER — Encounter: Payer: Self-pay | Admitting: Internal Medicine

## 2022-06-23 VITALS — BP 143/84 | HR 112 | Resp 16 | Ht 69.0 in | Wt 230.0 lb

## 2022-06-23 VITALS — BP 144/91 | HR 98 | Resp 20 | Ht 69.0 in | Wt 222.0 lb

## 2022-06-23 DIAGNOSIS — I5020 Unspecified systolic (congestive) heart failure: Secondary | ICD-10-CM

## 2022-06-23 DIAGNOSIS — Z951 Presence of aortocoronary bypass graft: Secondary | ICD-10-CM

## 2022-06-23 DIAGNOSIS — I10 Essential (primary) hypertension: Secondary | ICD-10-CM

## 2022-06-23 NOTE — Progress Notes (Addendum)
Primary Physician/Referring:  Avanell Shackleton, NP-C  Patient ID: Michael Hart, male    DOB: Nov 19, 1985, 37 y.o.   MRN: [id number]  Chief Complaint  Patient presents with   Other   Coronary Artery Disease   Follow-up   HPI:    Michael Hart  is a 37 y.o. male with a history of CAD status post 2 vessel CABG on 03/27/2022, hypertension, asthma, gout, nephrolithiasis, panic attacks, and a family history of CAD. He smoked briefly but quit 9 years ago.   He presents today for a follow-up visit. Patient has been doing well since the last time he was here. He has been going to cardiac rehab and progressing well with them. He had a follow-up with cardiac surgery and he will be going back to work with light duty restrictions soon. Patient states his heart rate was only elevated during his BP check and it went right back down to the 70s after the cuff was removed. He will keep an eye on his HR and BP and call the office if repeat readings are high. He denies chest pain, shortness of breath, palpitations, leg edema, syncope.  Past Medical History:  Diagnosis Date   Allergy    Asthma    Coronary artery disease    Gout    Kidney stone    Panic attacks    nocturnal, controls with self directed biofeedback   Primary hypertension 03/03/2022   Past Surgical History:  Procedure Laterality Date   CARDIAC CATHETERIZATION     CORONARY ARTERY BYPASS GRAFT N/A 03/27/2022   Procedure: CORONARY ARTERY BYPASS GRAFTING (CABG) x2 , USING BILATERAL INTERNAL MAMMARY ARTERIES;  Surgeon: Loreli Slot, MD;  Location: [facility] OR;  Service: Open Heart Surgery;  Laterality: N/A;   CORONARY/GRAFT ACUTE MI REVASCULARIZATION N/A 03/21/2022   Procedure: Coronary/Graft Acute MI Revascularization;  Surgeon: Elder Negus, MD;  Location: [facility] IN[facility]IVE [facility];  Service: Cardiovascular;  Laterality: N/A;   CYSTOSCOPY N/A 03/27/2022   Procedure: CYSTOSCOPY FLEXIBLE, PLACEMENT OF FOLEY CATHETER;  Surgeon: Jannifer Hick, MD;  Location: [facility] OR;  Service: Urology;  Laterality: N/A;   NO PAST SURGERIES     RADIAL ARTERY HARVEST Left 03/27/2022   Procedure: ATTEMPTED RADIAL ARTERY HARVEST;  Surgeon: Loreli Slot, MD;  Location: [facility] OR;  Service: Open Heart Surgery;  Laterality: Left;   TEE WITHOUT CARDIOVERSION N/A 03/27/2022   Procedure: TRANSESOPHAGEAL ECHOCARDIOGRAM (TEE);  Surgeon: Loreli Slot, MD;  Location: [facility] OR;  Service: Open Heart Surgery;  Laterality: N/A;   Family History  Problem Relation Age of Onset   Breast cancer Mother    Lung cancer Father    Heart disease Father        CABG   COPD Father    Hypertension Other    Stroke Other    Migraines Neg Hx    Headache Neg Hx    Sleep apnea Neg Hx     Social History   Tobacco Use   Smoking status: Former    Packs/day: 0.50    Years: 12.00    Total pack years: 6.00    Types: Cigarettes    Quit date: 2013    Years since quitting: 11.0   Smokeless tobacco: Never  Substance Use Topics   Alcohol use: Yes    Alcohol/week: 2.0 standard drinks of alcohol    Types: 2 Glasses of wine per week    Comment: occasional   Marital Status: Divorced  ROS  Review of Systems  Cardiovascular:  Negative for chest pain, dyspnea on exertion, leg swelling, palpitations and syncope.  Respiratory:  Negative for shortness of breath.   Neurological:  Negative for dizziness and light-headedness.   Objective  Blood pressure (!) 143/84, pulse (!) 112, resp. rate 16, height 5\' 9"  (1.753 m), weight 230 lb (104.3 kg), SpO2 96 %. Body mass index is 33.97 kg/m.     06/23/2022    1:00 PM 06/23/2022   10:53 AM 05/26/2022    8:00 AM  Vitals with BMI  Height 5\' 9"  5\' 9"  5' 9.5"  Weight 230 lbs 222 lbs 222 lbs 4 oz  BMI 33.95 16.10 96.04  Systolic 540 981 191  Diastolic 84 91 80  Pulse 478 98 82     Physical Exam Cardiovascular:     Rate and Rhythm: Normal rate and regular rhythm.     Pulses: Normal pulses.     Heart sounds: Normal  heart sounds. No murmur heard.    No gallop.  Pulmonary:     Effort: Pulmonary effort is normal.     Breath sounds: Normal breath sounds. No wheezing or rales.  Abdominal:     General: Bowel sounds are normal.     Palpations: Abdomen is soft.     Tenderness: There is no abdominal tenderness.  Musculoskeletal:     Right lower leg: No edema.     Left lower leg: No edema.  Neurological:     Mental Status: He is alert.    Medications and allergies   Allergies  Allergen Reactions   Penicillins Shortness Of Breath, Swelling and Other (See Comments)    Childhood reaction   Sulfa Antibiotics Shortness Of Breath, Swelling and Other (See Comments)    Childhood reaction     Medication list after today's encounter   Current Outpatient Medications:    acetaminophen (TYLENOL) 500 MG tablet, Take 1-2 tablets (500-1,000 mg total) by mouth every 6 (six) hours., Disp: 30 tablet, Rfl: 0   allopurinol (ZYLOPRIM) 100 MG tablet, Take 2 tablets (200 mg total) by mouth daily., Disp: 60 tablet, Rfl: 2   aspirin EC 81 MG tablet, Take 1 tablet (81 mg total) by mouth daily. Swallow whole., Disp: 30 tablet, Rfl: 12   cetirizine (ZYRTEC) 10 MG tablet, Take 10 mg by mouth daily as needed for allergies., Disp: , Rfl:    metoprolol tartrate (LOPRESSOR) 50 MG tablet, Take 1 tablet (50 mg total) by mouth 2 (two) times daily., Disp: 60 tablet, Rfl: 3   rosuvastatin (CRESTOR) 20 MG tablet, Take 1 tablet (20 mg total) by mouth daily., Disp: 30 tablet, Rfl: 3   ticagrelor (BRILINTA) 90 MG TABS tablet, Take 1 tablet (90 mg total) by mouth 2 (two) times daily., Disp: 60 tablet, Rfl: 3   colchicine 0.6 MG tablet, Take 1 tablet (0.6 mg total) by mouth 2 (two) times daily. Take at onset of foot or ankle pain., Disp: 30 tablet, Rfl: 0   rizatriptan (MAXALT-MLT) 10 MG disintegrating tablet, Dissolve 1 tablet (10 mg total) by mouth as needed for migraine. May repeat in 2 hours if needed (Patient not taking: Reported on  06/23/2022), Disp: 9 tablet, Rfl: 11  Laboratory examination:   Lab Results  Component Value Date   NA 137 04/14/2022   K 4.2 04/14/2022   CO2 27 04/14/2022   GLUCOSE 93 04/14/2022   BUN 14 04/14/2022   CREATININE 0.98 04/14/2022   CALCIUM 10.1 04/14/2022   GFRNONAA >  60 03/30/2022       Latest Ref Rng & Units 04/14/2022    9:21 AM 03/30/2022    1:51 AM 03/29/2022    6:30 AM  CMP  Glucose 70 - 99 mg/dL 93  164  128   BUN 6 - 23 mg/dL 14  13  12    Creatinine 0.40 - 1.50 mg/dL 0.98  0.89  1.02   Sodium 135 - 145 mEq/L 137  132  132   Potassium 3.5 - 5.1 mEq/L 4.2  3.6  4.4   Chloride 96 - 112 mEq/L 100  95  99   CO2 19 - 32 mEq/L 27  27  25    Calcium 8.4 - 10.5 mg/dL 10.1  8.7  8.6   Total Protein 6.0 - 8.3 g/dL 8.1     Total Bilirubin 0.2 - 1.2 mg/dL 0.4     Alkaline Phos 39 - 117 U/L 64     AST 0 - 37 U/L 22     ALT 0 - 53 U/L 43         Latest Ref Rng & Units 04/16/2022    8:56 AM 04/14/2022    9:21 AM 03/30/2022    1:51 AM  CBC  WBC 4.0 - 10.5 K/uL 6.6  6.9  13.3   Hemoglobin 13.0 - 17.0 g/dL 12.7  12.7  11.0   Hematocrit 39.0 - 52.0 % 37.5  37.5  30.2   Platelets 150.0 - 400.0 K/uL 497.0  553.0  302     Lipid Panel Recent Labs    2022-04-01 0105 03/23/22 1021 04/14/22 0921  CHOL 181 160 115  TRIG 432* 201* 227.0*  LDLCALC UNABLE TO CALCULATE IF TRIGLYCERIDE OVER 400 mg/dL 82  --   VLDL UNABLE TO CALCULATE IF TRIGLYCERIDE OVER 400 mg/dL 40 45.4*  HDL 29* 38* 25.80*  CHOLHDL 6.2 4.2 4  LDLDIRECT 109* 96 62.0    HEMOGLOBIN A1C Lab Results  Component Value Date   HGBA1C 5.2 01-Apr-2022   MPG 102.54 2022-04-01   TSH Recent Labs    01/28/22 0924  TSH 1.81    External labs:    Radiology:    Cardiac Studies:   Cardiac Catherization 01-Apr-2022: LM: Normal LAD: Prox 100% occlusion          Mid 80-90% stenoses          Diffuse 80% diag disease Ramus: Large vessel. Prox 75% stenosis Lcx: Mid 95%, OM1 95% bifurcation stenosis RCA: Mid 80%,  followed by 75% stenoses          Distal 80% stenosis    Echocardiogram April 01, 2022:   1. Left ventricular ejection fraction, by estimation, is 40 to 45%. The left ventricle has mildly decreased function. The left ventricle demonstrates regional wall motion abnormalities (see scoring diagram/findings for description). There is mild left  ventricular hypertrophy. Left ventricular diastolic parameters are indeterminate. There is severe hypokinesis of the left ventricular, mid-apical anteroseptal wall and anterior wall. There is akinesis of the left ventricular, apical segment.  2. Right ventricular systolic function is normal. The right ventricular size is normal.  3. The mitral valve is normal in structure. No evidence of mitral valve regurgitation. No evidence of mitral stenosis.  4. The aortic valve is normal in structure. Aortic valve regurgitation is not visualized. No aortic stenosis is present.  Upper Extremity and Bilateral Carotid US Pre-CABG 03/25/2022: Summary:  Right Carotid: The extracranial vessels were near-normal with only minimal  wall thickening or plaque.  Left  Carotid: The extracranial vessels were near-normal with only minimal  wall thickening or plaque.  Vertebrals:  Bilateral vertebral arteries demonstrate antegrade flow. Left vertebral artery demonstrates high resistant flow.  Subclavians: Normal flow hemodynamics were seen in bilateral subclavian arteries.   Right Upper Extremity: Doppler waveforms remain within normal limits with  right radial compression. Doppler waveforms decrease >50% with right ulnar  compression.  Left Upper Extremity: Doppler waveforms remain within normal limits with  left radial compression. Doppler waveforms decrease >50% with left ulnar  compression.   CABG 2-Vessel 03/27/2022: Median sternotomy, extracorporeal circulation Coronary artery bypass grafting x 2  Left internal mammary artery to ramus intermedius  Free right internal mammary  artery to distal right coronary.  EKG:   EKG 04/13/2022: Sinus tachycardia at 107 bpm.  Right axis.  Incomplete right bundle branch block.  Old anterior infarct.  Negative T waves.  Assessment     ICD-10-CM   1. S/P CABG x 2  Z95.1     2. HFrEF (heart failure with reduced ejection fraction) (HCC)  I50.20     3. Primary hypertension  I10        No orders of the defined types were placed in this encounter.  No orders of the defined types were placed in this encounter.  Medications Discontinued During This Encounter  Medication Reason   oxyCODONE (OXY IR/ROXICODONE) 5 MG immediate release tablet Completed Course     Recommendations:   S/P CABG x 2 He is doing well post-op. Surgical sites are healing well without complications.  Patient has been doing to cardiac rehab and doing very well with them. Defer to CT surgery for post-op restriction, he will return to work with light duty restrictions. Will continue Ticagrelor and Aspiring for 12 months, he is tolerating these well without bleeding diathesis.    HFrEF (heart failure with reduced ejection fraction) (HCC) No clinical evidence of heart failure on physical examination. Will plan to repeat echocardiogram in 6 months. He is currently walking at least 30 minutes/day and following a healthy diet. Discussed sleep apnea testing and he will follow-up with his PCP regarding this.   Primary hypertension Blood pressures well controlled.   Current medications without changes. BP always <130/80 when checked at rehab and at home Patient states his heart rate went back down to 70s right after his P was checked  Follow-up in 3 months or sooner if needed.   Floydene Flock, DO, [facility]  07/03/2022, 11:08 AM Office: [phone] Pager: [phone]

## 2022-06-23 NOTE — Progress Notes (Signed)
Cardiac Individual Treatment Plan  Patient Details  Name: DAQUAWN SEELMAN MRN: [id number] Date of Birth: 08/19/1985 Referring Provider:   Flowsheet Row INTENSIVE CARDIAC REHAB ORIENT from 05/26/2022 in [facility]  Referring Provider Truett Mainland, MD       Initial Encounter Date:  Flowsheet Row INTENSIVE CARDIAC REHAB ORIENT from 05/26/2022 in [facility]  Date 05/26/22       Visit Diagnosis: 03/21/22 STEMI, S/P DES prox LAD  03/27/22 S/P CABG x 2  Patient's Home Medications on Admission:  Current Outpatient Medications:    acetaminophen (TYLENOL) 500 MG tablet, Take 1-2 tablets (500-1,000 mg total) by mouth every 6 (six) hours., Disp: 30 tablet, Rfl: 0   allopurinol (ZYLOPRIM) 100 MG tablet, Take 2 tablets (200 mg total) by mouth daily., Disp: 60 tablet, Rfl: 2   aspirin EC 81 MG tablet, Take 1 tablet (81 mg total) by mouth daily. Swallow whole., Disp: 30 tablet, Rfl: 12   cetirizine (ZYRTEC) 10 MG tablet, Take 10 mg by mouth daily as needed for allergies., Disp: , Rfl:    colchicine 0.6 MG tablet, Take 1 tablet (0.6 mg total) by mouth 2 (two) times daily. Take at onset of foot or ankle pain. (Patient taking differently: Take 0.6 mg by mouth 2 (two) times daily as needed (Take at onset of foot or ankle pain.).), Disp: 30 tablet, Rfl: 0   metoprolol tartrate (LOPRESSOR) 50 MG tablet, Take 1 tablet (50 mg total) by mouth 2 (two) times daily., Disp: 60 tablet, Rfl: 3   rizatriptan (MAXALT-MLT) 10 MG disintegrating tablet, Dissolve 1 tablet (10 mg total) by mouth as needed for migraine. May repeat in 2 hours if needed (Patient not taking: Reported on 06/23/2022), Disp: 9 tablet, Rfl: 11   rosuvastatin (CRESTOR) 20 MG tablet, Take 1 tablet (20 mg total) by mouth daily., Disp: 30 tablet, Rfl: 3   ticagrelor (BRILINTA) 90 MG TABS tablet, Take 1 tablet (90 mg total) by  mouth 2 (two) times daily., Disp: 60 tablet, Rfl: 3  Past Medical History: Past Medical History:  Diagnosis Date   Allergy    Asthma    Coronary artery disease    Gout    Kidney stone    Panic attacks    nocturnal, controls with self directed biofeedback   Primary hypertension 03/03/2022    Tobacco Use: Social History   Tobacco Use  Smoking Status Former   Packs/day: 0.50   Years: 12.00   Total pack years: 6.00   Types: Cigarettes   Quit date: 2013   Years since quitting: 11.0  Smokeless Tobacco Never    Labs: Review Flowsheet  More data exists      Latest Ref Rng & Units 03/21/2022 03/23/2022 03/26/2022 03/27/2022 04/14/2022  Labs for ITP Cardiac and Pulmonary Rehab  Cholestrol 0 - 200 mg/dL 409  811  - - 914   LDL (calc) 0 - 99 mg/dL UNABLE TO CALCULATE IF TRIGLYCERIDE OVER 400 mg/dL  82  - - -  Direct LDL mg/dL 782  96  - - 95.6   HDL-C >39.00 mg/dL 29  38  - - 21.30   Trlycerides 0.0 - 149.0 mg/dL 865  784  - - 696.2   Hemoglobin A1c 4.8 - 5.6 % 5.2  - - - -  PH, Arterial 7.35 - 7.45 - - 7.47  7.356  7.368  7.344  7.312  7.355  7.361  7.359  7.383  7.392  -  PCO2 arterial 32 - 48 mmHg - - 29  36.3  38.2  41.5  46.9  40.7  44.6  44.6  42.3  39.9  -  Bicarbonate 20.0 - 28.0 mmol/L - - 21.1  20.3  22.0  22.7  23.8  22.8  25.3  25.1  25.5  25.2  24.2  -  TCO2 22 - 32 mmol/L 21  - - -  Acid-base deficit 0.0 - 2.0 mmol/L - - 1.5  5.0  3.0  3.0  3.0  3.0  1.0  -  O2 Saturation % - - 99.8  99  99  99  100  100  100  100  73  100  100  -    Capillary Blood Glucose: Lab Results  Component Value Date   GLUCAP 104 (H) 03/30/2022   GLUCAP 127 (H) 03/29/2022   GLUCAP 120 (H) 03/29/2022   GLUCAP 102 (H) 03/29/2022   GLUCAP 131 (H) 03/29/2022     Exercise Target Goals: Exercise Program Goal: Individual exercise prescription set using results from initial 6 min walk test and THRR while considering  patient's  activity barriers and safety.   Exercise Prescription Goal: Initial exercise prescription builds to 30-45 minutes a day of aerobic activity, 2-3 days per week.  Home exercise guidelines will be given to patient during program as part of exercise prescription that the participant will acknowledge.  Activity Barriers & Risk Stratification:  Activity Barriers & Cardiac Risk Stratification - 05/26/22 0931       Activity Barriers & Cardiac Risk Stratification   Activity Barriers Deconditioning;Muscular Weakness    Cardiac Risk Stratification Moderate             6 Minute Walk:  6 Minute Walk     Row Name 05/26/22 0832         6 Minute Walk   Phase Initial     Distance 1520 feet     Walk Time 6 minutes     # of Rest Breaks 0     MPH 2.9     METS 4.6     RPE 9     Perceived Dyspnea  0     VO2 Peak 16.23     Symptoms No     Resting HR 75 bpm     Resting BP 112/80     Resting Oxygen Saturation  97 %     Exercise Oxygen Saturation  during 6 min walk 98 %     Max Ex. HR 96 bpm     Max Ex. BP 118/80     2 Minute Post BP 106/72              Oxygen Initial Assessment:   Oxygen Re-Evaluation:   Oxygen Discharge (Final Oxygen Re-Evaluation):   Initial Exercise Prescription:  Initial Exercise Prescription - 05/26/22 0900       Date of Initial Exercise RX and Referring Provider   Date 05/26/22    Referring Provider Truett Mainland, MD    Expected Discharge Date 07/24/22      Treadmill   MPH 3.2    Grade 2    Minutes 15    METs 4.33      Biostep-RELP   Level  2    SPM 60    Minutes 15    METs 4.6      Prescription Details   Frequency (times per week) 3    Duration Progress to 30 minutes of continuous aerobic without signs/symptoms of physical distress      Intensity   THRR 40-80% of Max Heartrate 73-146    Ratings of Perceived Exertion 11-13    Perceived Dyspnea 0-4      Progression   Progression Continue progressive overload as per policy  without signs/symptoms or physical distress.      Resistance Training   Training Prescription Yes    Weight 4 lbs    Reps 10-15             Perform Capillary Blood Glucose checks as needed.  Exercise Prescription Changes:   Exercise Prescription Changes     Row Name 06/01/22 1000 06/10/22 1400           Response to Exercise   Blood Pressure (Admit) 116/68 118/70      Blood Pressure (Exercise) 144/80 140/82      Blood Pressure (Exit) 110/78 102/64      Heart Rate (Admit) 78 bpm 71 bpm      Heart Rate (Exercise) 109 bpm 116 bpm      Heart Rate (Exit) 82 bpm 81 bpm      Rating of Perceived Exertion (Exercise) 12 12.5      Symptoms None None      Comments Pt's firast day in the CRP2 program Reviewed METS      Duration Continue with 30 min of aerobic exercise without signs/symptoms of physical distress. Continue with 30 min of aerobic exercise without signs/symptoms of physical distress.      Intensity THRR unchanged THRR unchanged        Progression   Progression Continue to progress workloads to maintain intensity without signs/symptoms of physical distress. Continue to progress workloads to maintain intensity without signs/symptoms of physical distress.      Average METs 3.86 4.2        Resistance Training   Training Prescription Yes No      Weight 4 lbs No weights on Wednesdays      Reps 10-15 --      Time 10 Minutes --        Interval Training   Interval Training No No        Treadmill   MPH 2.8 3      Grade 2 2      Minutes 15 15      METs 3.91 4.12        Biostep-RELP   Level 4 5      Minutes 15 15      METs 3.8 4.3               Exercise Comments:   Exercise Comments     Row Name 06/01/22 1100 06/10/22 1405         Exercise Comments Pt's first day in the CRP2 program. Pt had no complaints. Pt did feel that the intial setting of 3.2/20 on the treadmill was too fast, so the speed was reduced to 2.8. Workloads were increased on the recumbent  stepper at the patient's request. Pt tolerated session well. Reviewed METs. Increased workload on Octane recumbent elliptical stepper.               Exercise Goals and Review:   Exercise Goals  Row Name 05/26/22 0935             Exercise Goals   Increase Physical Activity Yes       Intervention Provide advice, education, support and counseling about physical activity/exercise needs.;Develop an individualized exercise prescription for aerobic and resistive training based on initial evaluation findings, risk stratification, comorbidities and participant's personal goals.       Expected Outcomes Short Term: Attend rehab on a regular basis to increase amount of physical activity.;Long Term: Add in home exercise to make exercise part of routine and to increase amount of physical activity.;Long Term: Exercising regularly at least 3-5 days a week.       Increase Strength and Stamina Yes       Intervention Provide advice, education, support and counseling about physical activity/exercise needs.;Develop an individualized exercise prescription for aerobic and resistive training based on initial evaluation findings, risk stratification, comorbidities and participant's personal goals.       Expected Outcomes Short Term: Increase workloads from initial exercise prescription for resistance, speed, and METs.;Short Term: Perform resistance training exercises routinely during rehab and add in resistance training at home;Long Term: Improve cardiorespiratory fitness, muscular endurance and strength as measured by increased METs and functional capacity (6MWT)       Able to understand and use rate of perceived exertion (RPE) scale Yes       Intervention Provide education and explanation on how to use RPE scale       Expected Outcomes Short Term: Able to use RPE daily in rehab to express subjective intensity level;Long Term:  Able to use RPE to guide intensity level when exercising independently        Knowledge and understanding of Target Heart Rate Range (THRR) Yes       Intervention Provide education and explanation of THRR including how the numbers were predicted and where they are located for reference       Expected Outcomes Short Term: Able to state/look up THRR;Short Term: Able to use daily as guideline for intensity in rehab;Long Term: Able to use THRR to govern intensity when exercising independently       Understanding of Exercise Prescription Yes       Intervention Provide education, explanation, and written materials on patient's individual exercise prescription       Expected Outcomes Short Term: Able to explain program exercise prescription;Long Term: Able to explain home exercise prescription to exercise independently                Exercise Goals Re-Evaluation :  Exercise Goals Re-Evaluation     Row Name 06/01/22 1058             Exercise Goal Re-Evaluation   Exercise Goals Review Increase Physical Activity;Increase Strength and Stamina;Able to understand and use rate of perceived exertion (RPE) scale;Knowledge and understanding of Target Heart Rate Range (THRR);Understanding of Exercise Prescription       Comments Pt's first day in the CRP2 program. Pt understands the exercise Rx, RPE scale and THRR.       Expected Outcomes Will continue to monitor patient and progress exercise workloads as tolerated.                Discharge Exercise Prescription (Final Exercise Prescription Changes):  Exercise Prescription Changes - 06/10/22 1400       Response to Exercise   Blood Pressure (Admit) 118/70    Blood Pressure (Exercise) 140/82    Blood Pressure (Exit) 102/64    Heart  Rate (Admit) 71 bpm    Heart Rate (Exercise) 116 bpm    Heart Rate (Exit) 81 bpm    Rating of Perceived Exertion (Exercise) 12.5    Symptoms None    Comments Reviewed METS    Duration Continue with 30 min of aerobic exercise without signs/symptoms of physical distress.    Intensity THRR  unchanged      Progression   Progression Continue to progress workloads to maintain intensity without signs/symptoms of physical distress.    Average METs 4.2      Resistance Training   Training Prescription No    Weight No weights on Wednesdays      Interval Training   Interval Training No      Treadmill   MPH 3    Grade 2    Minutes 15    METs 4.12      Biostep-RELP   Level 5    Minutes 15    METs 4.3             Nutrition:  Target Goals: Understanding of nutrition guidelines, daily intake of sodium 1500mg , cholesterol 200mg , calories 30% from fat and 7% or less from saturated fats, daily to have 5 or more servings of fruits and vegetables.  Biometrics:  Pre Biometrics - 05/26/22 0800       Pre Biometrics   Waist Circumference 44 inches    Hip Circumference 40 inches    Waist to Hip Ratio 1.1 %    Triceps Skinfold 21 mm    % Body Fat 31.4 %    Grip Strength 46 kg    Flexibility 11.5 in    Single Leg Stand 30 seconds              Nutrition Therapy Plan and Nutrition Goals:  Nutrition Therapy & Goals - 06/01/22 0950       Nutrition Therapy   Diet Heart Healthy Diet    Drug/Food Interactions Statins/Certain Fruits;Purine/Gout      Personal Nutrition Goals   Nutrition Goal Patient to identify strategies for improving cardiovascular risk by attending the weekly Pritikin education and nutrition series.    Personal Goal #2 Patient to identify strategies for weight loss of 0.5-2.0# per week of weight loss.    Personal Goal #3 Patient to reduce sodium to 1500mg  per day    Personal Goal #4 Patient to identify food sources and limit daily intake of of saturated fat, trans fat, sodium, and refined carbohydrates    Comments Goals in action. Hense has started making many dietary changes including reduced fast food/calorie dense foods and increased high fiber foods. He is down ~25# over the last 4 months (246# on 03/03/2022). He is motivated to get back to  running and lose an additional 25#. CPAP has been ordered.      Intervention Plan   Intervention Prescribe, educate and counsel regarding individualized specific dietary modifications aiming towards targeted core components such as weight, hypertension, lipid management, diabetes, heart failure and other comorbidities.;Nutrition handout(s) given to patient.    Expected Outcomes Short Term Goal: Understand basic principles of dietary content, such as calories, fat, sodium, cholesterol and nutrients.;Long Term Goal: Adherence to prescribed nutrition plan.             Nutrition Assessments:  Nutrition Assessments - 05/26/22 1127       Rate Your Plate Scores   Pre Score 79            MEDIFICTS Score Key: ?70  Need to make dietary changes  40-70 Heart Healthy Diet ? 40 Therapeutic Level Cholesterol Diet   Flowsheet Row INTENSIVE CARDIAC REHAB ORIENT from 05/26/2022 in [facility]  Picture Your Plate Total Score on Admission 79      Picture Your Plate Scores: <74 Unhealthy dietary pattern with much room for improvement. 41-50 Dietary pattern unlikely to meet recommendations for good health and room for improvement. 51-60 More healthful dietary pattern, with some room for improvement.  >60 Healthy dietary pattern, although there may be some specific behaviors that could be improved.    Nutrition Goals Re-Evaluation:  Nutrition Goals Re-Evaluation     [place] Name 06/01/22 0950             Goals   Current Weight 226 lb 6.6 oz (102.7 kg)       Comment Triglycerides 227, HDL 25.8, VLDL 45.4, testosterone 181, A1c WNL       Expected Outcome Goals in action. Hense has started making many dietary changes including reduced fast food/calorie dense foods and increased high fiber foods. He is down ~25# over the last 4 months (246# on 03/03/2022). He is motivated to get back to running and lose an additional 25#. CPAP has been  ordered. Hense will continue to benefit from adherance to the Pritikin eating plan to aid with weight loss, triglycerides 150mg /dL, and low sodium diet 1500mg /day.                Nutrition Goals Re-Evaluation:  Nutrition Goals Re-Evaluation     [place] Name 06/01/22 0950             Goals   Current Weight 226 lb 6.6 oz (102.7 kg)       Comment Triglycerides 227, HDL 25.8, VLDL 45.4, testosterone 181, A1c WNL       Expected Outcome Goals in action. Hense has started making many dietary changes including reduced fast food/calorie dense foods and increased high fiber foods. He is down ~25# over the last 4 months (246# on 03/03/2022). He is motivated to get back to running and lose an additional 25#. CPAP has been ordered. Hense will continue to benefit from adherance to the Pritikin eating plan to aid with weight loss, triglycerides 150mg /dL, and low sodium diet 1500mg /day.                Nutrition Goals Discharge (Final Nutrition Goals Re-Evaluation):  Nutrition Goals Re-Evaluation - 06/01/22 0950       Goals   Current Weight 226 lb 6.6 oz (102.7 kg)    Comment Triglycerides 227, HDL 25.8, VLDL 45.4, testosterone 181, A1c WNL    Expected Outcome Goals in action. Hense has started making many dietary changes including reduced fast food/calorie dense foods and increased high fiber foods. He is down ~25# over the last 4 months (246# on 03/03/2022). He is motivated to get back to running and lose an additional 25#. CPAP has been ordered. Hense will continue to benefit from adherance to the Pritikin eating plan to aid with weight loss, triglycerides 150mg /dL, and low sodium diet 1500mg /day.             Psychosocial: Target Goals: Acknowledge presence or absence of significant depression and/or stress, maximize coping skills, provide positive support system. Participant is able to verbalize types and ability to use techniques and skills needed for reducing stress and  depression.  Initial Review & Psychosocial Screening:  Initial Psych Review & Screening -  05/26/22 1610       Initial Review   Current issues with None Identified      Family Dynamics   Good Support System? Yes   Hense has his Mom, ex inlaws, ex wife and girlfirend for support     Barriers   Psychosocial barriers to participate in program There are no identifiable barriers or psychosocial needs.      Screening Interventions   Interventions Encouraged to exercise             Quality of Life Scores:  Quality of Life - 05/26/22 0936       Quality of Life   Select Quality of Life      Quality of Life Scores   Health/Function Pre 22.27 %    Socioeconomic Pre 23.71 %    Psych/Spiritual Pre 20.36 %    Family Pre 23.2 %    GLOBAL Pre 22.31 %            Scores of 19 and below usually indicate a poorer quality of life in these areas.  A difference of  2-3 points is a clinically meaningful difference.  A difference of 2-3 points in the total score of the Quality of Life Index has been associated with significant improvement in overall quality of life, self-image, physical symptoms, and general health in studies assessing change in quality of life.  PHQ-9: Review Flowsheet       05/26/2022 04/16/2022 01/28/2022 04/05/2017  Depression screen PHQ 2/9  Decreased Interest 0 1 0 0  Down, Depressed, Hopeless 0 1 0 0  PHQ - 2 Score 0 2 0 0  Altered sleeping - 2 - -  Tired, decreased energy - 2 - -  Change in appetite - 0 - -  Feeling bad or failure about yourself  - 1 - -  Trouble concentrating - 1 - -  Moving slowly or fidgety/restless - 0 - -  Suicidal thoughts - 0 - -  PHQ-9 Score - 8 - -  Difficult doing work/chores - Not difficult at all - -   Interpretation of Total Score  Total Score Depression Severity:  1-4 = Minimal depression, 5-9 = Mild depression, 10-14 = Moderate depression, 15-19 = Moderately severe depression, 20-27 = Severe depression   Psychosocial  Evaluation and Intervention:   Psychosocial Re-Evaluation:  Psychosocial Re-Evaluation     Row Name 06/01/22 1308 06/23/22 1338           Psychosocial Re-Evaluation   Current issues with None Identified None Identified      Interventions Encouraged to attend Cardiac Rehabilitation for the exercise Encouraged to attend Cardiac Rehabilitation for the exercise      Continue Psychosocial Services  No Follow up required No Follow up required               Psychosocial Discharge (Final Psychosocial Re-Evaluation):  Psychosocial Re-Evaluation - 06/23/22 1338       Psychosocial Re-Evaluation   Current issues with None Identified    Interventions Encouraged to attend Cardiac Rehabilitation for the exercise    Continue Psychosocial Services  No Follow up required             Vocational Rehabilitation: Provide vocational rehab assistance to qualifying candidates.   Vocational Rehab Evaluation & Intervention:  Vocational Rehab - 05/26/22 0819       Initial Vocational Rehab Evaluation & Intervention   Assessment shows need for Vocational Rehabilitation No   Hense will return to work when  cleared and does not need vocatinal rehab at this time            Education: Education Goals: Education classes will be provided on a weekly basis, covering required topics. Participant will state understanding/return demonstration of topics presented.    Education     Row Name 06/01/22 0900     Education   Cardiac Education Topics Pritikin   Select Core Videos     Core Videos   Educator Exercise Physiologist   Select Exercise Education   Exercise Education Biomechanial Limitations   Instruction Review Code 1- Verbalizes Understanding   Class Start Time [phone]   Class Stop Time 0901   Class Time Calculation (min) 49 min    Row Name 06/03/22 1200     Education   Cardiac Education Topics Pritikin   Customer service manager   Weekly  Topic Fast Evening Meals  Butternut Squash Soup   Instruction Review Code 1- Verbalizes Understanding   Class Start Time [phone]   Class Stop Time 4782   Class Time Calculation (min) 41 min    Row Name 06/08/22 0900     Education   Cardiac Education Topics Pritikin   Select Workshops     Workshops   Educator Dietitian   Select Nutrition   Nutrition Workshop Fueling a Forensic psychologist   Instruction Review Code 1- Verbalizes Understanding   Class Start Time 0815   Class Stop Time 0901   Class Time Calculation (min) 46 min    Row Name 06/10/22 1200     Education   Cardiac Education Topics Pritikin   Customer service manager   Weekly Topic International Cuisine- Spotlight on the [employer] Zones   Instruction Review Code 1- Verbalizes Understanding   Class Start Time 0815   Class Stop Time [phone]   Class Time Calculation (min) 37 min    Row Name 06/12/22 0900     Education   Cardiac Education Topics Pritikin   Select Workshops     Workshops   Educator Exercise Physiologist   Select Psychosocial   Psychosocial Workshop Recognizing and Reducing Stress   Instruction Review Code 1- Verbalizes Understanding   Class Start Time [phone]   Class Stop Time 0900   Class Time Calculation (min) 50 min    Row Name 06/17/22 1300     Education   Cardiac Education Topics Pritikin   Nurse, children's   Educator Exercise Physiologist   Select Nutrition   Nutrition Cooking - Healthy Salads and Dressing   Instruction Review Code 1- Verbalizes Understanding   Class Start Time 0803   Class Stop Time 0842   Class Time Calculation (min) 39 min    Row Name 06/19/22 0900     Education   Cardiac Education Topics Pritikin   Select Core Videos     Core Videos   Educator Exercise Physiologist   Select General Education   General Education Heart Disease Risk Reduction   Instruction Review Code 1- Verbalizes Understanding   Class Start Time  [phone]   Class Stop Time 0855   Class Time Calculation (min) 45 min            Core Videos: Exercise    Move It!  Clinical staff conducted group or individual video education with verbal and written material and guidebook.  Patient learns the  recommended Pritikin exercise program. Exercise with the goal of living a long, healthy life. Some of the health benefits of exercise include controlled diabetes, healthier blood pressure levels, improved cholesterol levels, improved heart and lung capacity, improved sleep, and better body composition. Everyone should speak with their doctor before starting or changing an exercise routine.  Biomechanical Limitations Clinical staff conducted group or individual video education with verbal and written material and guidebook.  Patient learns how biomechanical limitations can impact exercise and how we can mitigate and possibly overcome limitations to have an impactful and balanced exercise routine.  Body Composition Clinical staff conducted group or individual video education with verbal and written material and guidebook.  Patient learns that body composition (ratio of muscle mass to fat mass) is a key component to assessing overall fitness, rather than body weight alone. Increased fat mass, especially visceral belly fat, can put [place] at increased risk for metabolic syndrome, type 2 diabetes, heart disease, and even death. It is recommended to combine diet and exercise (cardiovascular and resistance training) to improve your body composition. Seek guidance from your physician and exercise physiologist before implementing an exercise routine.  Exercise Action Plan Clinical staff conducted group or individual video education with verbal and written material and guidebook.  Patient learns the recommended strategies to achieve and enjoy long-term exercise adherence, including variety, self-motivation, self-efficacy, and positive decision making. Benefits of  exercise include fitness, good health, weight management, more energy, better sleep, less stress, and overall well-being.  Medical   Heart Disease Risk Reduction Clinical staff conducted group or individual video education with verbal and written material and guidebook.  Patient learns our heart is our most vital organ as it circulates oxygen, nutrients, white blood cells, and hormones throughout the entire body, and carries waste away. Data supports a plant-based eating plan like the Pritikin Program for its effectiveness in slowing progression of and reversing heart disease. The video provides a number of recommendations to address heart disease.   Metabolic Syndrome and Belly Fat  Clinical staff conducted group or individual video education with verbal and written material and guidebook.  Patient learns what metabolic syndrome is, how it leads to heart disease, and how one can reverse it and keep it from coming back. You have metabolic syndrome if you have 3 of the following 5 criteria: abdominal obesity, high blood pressure, high triglycerides, low HDL cholesterol, and high blood sugar.  Hypertension and Heart Disease Clinical staff conducted group or individual video education with verbal and written material and guidebook.  Patient learns that high blood pressure, or hypertension, is very common in the [place]. Hypertension is largely due to excessive salt intake, but other important risk factors include being overweight, physical inactivity, drinking too much alcohol, smoking, and not eating enough potassium from fruits and vegetables. High blood pressure is a leading risk factor for heart attack, stroke, congestive heart failure, dementia, kidney failure, and premature death. Long-term effects of excessive salt intake include stiffening of the arteries and thickening of heart muscle and organ damage. Recommendations include ways to reduce hypertension and the risk of heart  disease.  Diseases of Our Time - Focusing on Diabetes Clinical staff conducted group or individual video education with verbal and written material and guidebook.  Patient learns why the best way to stop diseases of our time is prevention, through food and other lifestyle changes. Medicine (such as prescription pills and surgeries) is often only a Band-Aid on the problem, not a long-term solution. Most  common diseases of our time include obesity, type 2 diabetes, hypertension, heart disease, and cancer. The Pritikin Program is recommended and has been proven to help reduce, reverse, and/or prevent the damaging effects of metabolic syndrome.  Nutrition   Overview of the Pritikin Eating Plan  Clinical staff conducted group or individual video education with verbal and written material and guidebook.  Patient learns about the Pritikin Eating Plan for disease risk reduction. The Pritikin Eating Plan emphasizes a wide variety of unrefined, minimally-processed carbohydrates, like fruits, vegetables, whole grains, and legumes. Go, Caution, and Stop food choices are explained. Plant-based and lean animal proteins are emphasized. Rationale provided for low sodium intake for blood pressure control, low added sugars for blood sugar stabilization, and low added fats and oils for coronary artery disease risk reduction and weight management.  Calorie Density  Clinical staff conducted group or individual video education with verbal and written material and guidebook.  Patient learns about calorie density and how it impacts the Pritikin Eating Plan. Knowing the characteristics of the food you choose will help you decide whether those foods will lead to weight gain or weight loss, and whether you want to consume more or less of them. Weight loss is usually a side effect of the Pritikin Eating Plan because of its focus on low calorie-dense foods.  Label Reading  Clinical staff conducted group or individual video  education with verbal and written material and guidebook.  Patient learns about the Pritikin recommended label reading guidelines and corresponding recommendations regarding calorie density, added sugars, sodium content, and whole grains.  Dining Out - Part 1  Clinical staff conducted group or individual video education with verbal and written material and guidebook.  Patient learns that restaurant meals can be sabotaging because they can be so high in calories, fat, sodium, and/or sugar. Patient learns recommended strategies on how to positively address this and avoid unhealthy pitfalls.  Facts on Fats  Clinical staff conducted group or individual video education with verbal and written material and guidebook.  Patient learns that lifestyle modifications can be just as effective, if not more so, as many medications for lowering your risk of heart disease. A Pritikin lifestyle can help to reduce your risk of inflammation and atherosclerosis (cholesterol build-up, or plaque, in the artery walls). Lifestyle interventions such as dietary choices and physical activity address the cause of atherosclerosis. A review of the types of fats and their impact on blood cholesterol levels, along with dietary recommendations to reduce fat intake is also included.  Nutrition Action Plan  Clinical staff conducted group or individual video education with verbal and written material and guidebook.  Patient learns how to incorporate Pritikin recommendations into their lifestyle. Recommendations include planning and keeping personal health goals in mind as an important part of their success.  Healthy Mind-Set    Healthy Minds, Bodies, Hearts  Clinical staff conducted group or individual video education with verbal and written material and guidebook.  Patient learns how to identify when they are stressed. Video will discuss the impact of that stress, as well as the many benefits of stress management. Patient will also  be introduced to stress management techniques. The way we think, act, and feel has an impact on our hearts.  How Our Thoughts Can Heal Our Hearts  Clinical staff conducted group or individual video education with verbal and written material and guidebook.  Patient learns that negative thoughts can cause depression and anxiety. This can result in negative lifestyle behavior and serious  health problems. Cognitive behavioral therapy is an effective method to help control our thoughts in order to change and improve our emotional outlook.  Additional Videos:  Exercise    Improving Performance  Clinical staff conducted group or individual video education with verbal and written material and guidebook.  Patient learns to use a non-linear approach by alternating intensity levels and lengths of time spent exercising to help burn more calories and lose more body fat. Cardiovascular exercise helps improve heart health, metabolism, hormonal balance, blood sugar control, and recovery from fatigue. Resistance training improves strength, endurance, balance, coordination, reaction time, metabolism, and muscle mass. Flexibility exercise improves circulation, posture, and balance. Seek guidance from your physician and exercise physiologist before implementing an exercise routine and learn your capabilities and proper form for all exercise.  Introduction to Yoga  Clinical staff conducted group or individual video education with verbal and written material and guidebook.  Patient learns about yoga, a discipline of the coming together of mind, breath, and body. The benefits of yoga include improved flexibility, improved range of motion, better posture and core strength, increased lung function, weight loss, and positive self-image. Yoga's heart health benefits include lowered blood pressure, healthier heart rate, decreased cholesterol and triglyceride levels, improved immune function, and reduced stress. Seek guidance  from your physician and exercise physiologist before implementing an exercise routine and learn your capabilities and proper form for all exercise.  Medical   Aging: Enhancing Your Quality of Life  Clinical staff conducted group or individual video education with verbal and written material and guidebook.  Patient learns key strategies and recommendations to stay in good physical health and enhance quality of life, such as prevention strategies, having an advocate, securing a Health Care Proxy and Power of Attorney, and keeping a list of medications and system for tracking them. It also discusses how to avoid risk for bone loss.  Biology of Weight Control  Clinical staff conducted group or individual video education with verbal and written material and guidebook.  Patient learns that weight gain occurs because we consume more calories than we burn (eating more, moving less). Even if your body weight is normal, you may have higher ratios of fat compared to muscle mass. Too much body fat puts you at increased risk for cardiovascular disease, heart attack, stroke, type 2 diabetes, and obesity-related cancers. In addition to exercise, following the Pritikin Eating Plan can help reduce your risk.  Decoding Lab Results  Clinical staff conducted group or individual video education with verbal and written material and guidebook.  Patient learns that lab test reflects one measurement whose values change over time and are influenced by many factors, including medication, stress, sleep, exercise, food, hydration, pre-existing medical conditions, and more. It is recommended to use the knowledge from this video to become more involved with your lab results and evaluate your numbers to speak with your doctor.   Diseases of Our Time - Overview  Clinical staff conducted group or individual video education with verbal and written material and guidebook.  Patient learns that according to the CDC, 50% to 70% of  chronic diseases (such as obesity, type 2 diabetes, elevated lipids, hypertension, and heart disease) are avoidable through lifestyle improvements including healthier food choices, listening to satiety cues, and increased physical activity.  Sleep Disorders Clinical staff conducted group or individual video education with verbal and written material and guidebook.  Patient learns how good quality and duration of sleep are important to overall health and well-being. Patient also  learns about sleep disorders and how they impact health along with recommendations to address them, including discussing with a physician.  Nutrition  Dining Out - Part 2 Clinical staff conducted group or individual video education with verbal and written material and guidebook.  Patient learns how to plan ahead and communicate in order to maximize their dining experience in a healthy and nutritious manner. Included are recommended food choices based on the type of restaurant the patient is visiting.   Fueling a Banker conducted group or individual video education with verbal and written material and guidebook.  There is a strong connection between our food choices and our health. Diseases like obesity and type 2 diabetes are very prevalent and are in large-part due to lifestyle choices. The Pritikin Eating Plan provides plenty of food and hunger-curbing satisfaction. It is easy to follow, affordable, and helps reduce health risks.  Menu Workshop  Clinical staff conducted group or individual video education with verbal and written material and guidebook.  Patient learns that restaurant meals can sabotage health goals because they are often packed with calories, fat, sodium, and sugar. Recommendations include strategies to plan ahead and to communicate with the manager, chef, or server to help order a healthier meal.  Planning Your Eating Strategy  Clinical staff conducted group or individual video  education with verbal and written material and guidebook.  Patient learns about the Pritikin Eating Plan and its benefit of reducing the risk of disease. The Pritikin Eating Plan does not focus on calories. Instead, it emphasizes high-quality, nutrient-rich foods. By knowing the characteristics of the foods, we choose, we can determine their calorie density and make informed decisions.  Targeting Your Nutrition Priorities  Clinical staff conducted group or individual video education with verbal and written material and guidebook.  Patient learns that lifestyle habits have a tremendous impact on disease risk and progression. This video provides eating and physical activity recommendations based on your personal health goals, such as reducing LDL cholesterol, losing weight, preventing or controlling type 2 diabetes, and reducing high blood pressure.  Vitamins and Minerals  Clinical staff conducted group or individual video education with verbal and written material and guidebook.  Patient learns different ways to obtain key vitamins and minerals, including through a recommended healthy diet. It is important to discuss all supplements you take with your doctor.   Healthy Mind-Set    Smoking Cessation  Clinical staff conducted group or individual video education with verbal and written material and guidebook.  Patient learns that cigarette smoking and tobacco addiction pose a serious health risk which affects millions of people. Stopping smoking will significantly reduce the risk of heart disease, lung disease, and many forms of cancer. Recommended strategies for quitting are covered, including working with your doctor to develop a successful plan.  Culinary   Becoming a Set designer conducted group or individual video education with verbal and written material and guidebook.  Patient learns that cooking at home can be healthy, cost-effective, quick, and puts them in control. Keys to  cooking healthy recipes will include looking at your recipe, assessing your equipment needs, planning ahead, making it simple, choosing cost-effective seasonal ingredients, and limiting the use of added fats, salts, and sugars.  Cooking - Breakfast and Snacks  Clinical staff conducted group or individual video education with verbal and written material and guidebook.  Patient learns how important breakfast is to satiety and nutrition through the entire day. Recommendations include key foods  to eat during breakfast to help stabilize blood sugar levels and to prevent overeating at meals later in the day. Planning ahead is also a key component.  Cooking - Educational psychologist conducted group or individual video education with verbal and written material and guidebook.  Patient learns eating strategies to improve overall health, including an approach to cook more at home. Recommendations include thinking of animal protein as a side on your plate rather than center stage and focusing instead on lower calorie dense options like vegetables, fruits, whole grains, and plant-based proteins, such as beans. Making sauces in large quantities to freeze for later and leaving the skin on your vegetables are also recommended to maximize your experience.  Cooking - Healthy Salads and Dressing Clinical staff conducted group or individual video education with verbal and written material and guidebook.  Patient learns that vegetables, fruits, whole grains, and legumes are the foundations of the Pritikin Eating Plan. Recommendations include how to incorporate each of these in flavorful and healthy salads, and how to create homemade salad dressings. Proper handling of ingredients is also covered. Cooking - Soups and [employer] - Soups and Desserts Clinical staff conducted group or individual video education with verbal and written material and guidebook.  Patient learns that Pritikin soups and desserts  make for easy, nutritious, and delicious snacks and meal components that are low in sodium, fat, sugar, and calorie density, while high in vitamins, minerals, and filling fiber. Recommendations include simple and healthy ideas for soups and desserts.   Overview     The Pritikin Solution Program Overview Clinical staff conducted group or individual video education with verbal and written material and guidebook.  Patient learns that the results of the Pritikin Program have been documented in more than 100 articles published in peer-reviewed journals, and the benefits include reducing risk factors for (and, in some cases, even reversing) high cholesterol, high blood pressure, type 2 diabetes, obesity, and more! An overview of the three key pillars of the Pritikin Program will be covered: eating well, doing regular exercise, and having a healthy mind-set.  WORKSHOPS  Exercise: Exercise Basics: Building Your Action Plan Clinical staff led group instruction and group discussion with PowerPoint presentation and patient guidebook. To enhance the learning environment the use of posters, models and videos may be added. At the conclusion of this workshop, patients will comprehend the difference between physical activity and exercise, as well as the benefits of incorporating both, into their routine. Patients will understand the FITT (Frequency, Intensity, Time, and Type) principle and how to use it to build an exercise action plan. In addition, safety concerns and other considerations for exercise and cardiac rehab will be addressed by the presenter. The purpose of this lesson is to promote a comprehensive and effective weekly exercise routine in order to improve patients' overall level of fitness.   Managing Heart Disease: Your Path to a Healthier Heart Clinical staff led group instruction and group discussion with PowerPoint presentation and patient guidebook. To enhance the learning environment the use  of posters, models and videos may be added.At the conclusion of this workshop, patients will understand the anatomy and physiology of the heart. Additionally, they will understand how Pritikin's three pillars impact the risk factors, the progression, and the management of heart disease.  The purpose of this lesson is to provide a high-level overview of the heart, heart disease, and how the Pritikin lifestyle positively impacts risk factors.  Exercise Biomechanics Clinical staff led  group instruction and group discussion with PowerPoint presentation and patient guidebook. To enhance the learning environment the use of posters, models and videos may be added. Patients will learn how the structural parts of their bodies function and how these functions impact their daily activities, movement, and exercise. Patients will learn how to promote a neutral spine, learn how to manage pain, and identify ways to improve their physical movement in order to promote healthy living. The purpose of this lesson is to expose patients to common physical limitations that impact physical activity. Participants will learn practical ways to adapt and manage aches and pains, and to minimize their effect on regular exercise. Patients will learn how to maintain good posture while sitting, walking, and lifting.  Balance Training and Fall Prevention  Clinical staff led group instruction and group discussion with PowerPoint presentation and patient guidebook. To enhance the learning environment the use of posters, models and videos may be added. At the conclusion of this workshop, patients will understand the importance of their sensorimotor skills (vision, proprioception, and the vestibular system) in maintaining their ability to balance as they age. Patients will apply a variety of balancing exercises that are appropriate for their current level of function. Patients will understand the common causes for poor balance,  possible solutions to these problems, and ways to modify their physical environment in order to minimize their fall risk. The purpose of this lesson is to teach patients about the importance of maintaining balance as they age and ways to minimize their risk of falling.  WORKSHOPS   Nutrition:  Fueling a Ship broker led group instruction and group discussion with PowerPoint presentation and patient guidebook. To enhance the learning environment the use of posters, models and videos may be added. Patients will review the foundational principles of the Pritikin Eating Plan and understand what constitutes a serving size in each of the food groups. Patients will also learn Pritikin-friendly foods that are better choices when away from home and review make-ahead meal and snack options. Calorie density will be reviewed and applied to three nutrition priorities: weight maintenance, weight loss, and weight gain. The purpose of this lesson is to reinforce (in a group setting) the key concepts around what patients are recommended to eat and how to apply these guidelines when away from home by planning and selecting Pritikin-friendly options. Patients will understand how calorie density may be adjusted for different weight management goals.  Mindful Eating  Clinical staff led group instruction and group discussion with PowerPoint presentation and patient guidebook. To enhance the learning environment the use of posters, models and videos may be added. Patients will briefly review the concepts of the Pritikin Eating Plan and the importance of low-calorie dense foods. The concept of mindful eating will be introduced as well as the importance of paying attention to internal hunger signals. Triggers for non-hunger eating and techniques for dealing with triggers will be explored. The purpose of this lesson is to provide patients with the opportunity to review the basic principles of the Pritikin Eating  Plan, discuss the value of eating mindfully and how to measure internal cues of hunger and fullness using the Hunger Scale. Patients will also discuss reasons for non-hunger eating and learn strategies to use for controlling emotional eating.  Targeting Your Nutrition Priorities Clinical staff led group instruction and group discussion with PowerPoint presentation and patient guidebook. To enhance the learning environment the use of posters, models and videos may be added. Patients will learn how  to determine their genetic susceptibility to disease by reviewing their family history. Patients will gain insight into the importance of diet as part of an overall healthy lifestyle in mitigating the impact of genetics and other environmental insults. The purpose of this lesson is to provide patients with the opportunity to assess their personal nutrition priorities by looking at their family history, their own health history and current risk factors. Patients will also be able to discuss ways of prioritizing and modifying the Pritikin Eating Plan for their highest risk areas  Menu  Clinical staff led group instruction and group discussion with PowerPoint presentation and patient guidebook. To enhance the learning environment the use of posters, models and videos may be added. Using menus brought in from [employer], or printed from [employer], patients will apply the Pritikin dining out guidelines that were presented in the [employer] video. Patients will also be able to practice these guidelines in a variety of provided scenarios. The purpose of this lesson is to provide patients with the opportunity to practice hands-on learning of the Pritikin Dining Out guidelines with actual menus and practice scenarios.  Label Reading Clinical staff led group instruction and group discussion with PowerPoint presentation and patient guidebook. To enhance the learning environment the use of  posters, models and videos may be added. Patients will review and discuss the Pritikin label reading guidelines presented in Pritikin's Label Reading Educational series video. Using fool labels brought in from local grocery stores and markets, patients will apply the label reading guidelines and determine if the packaged food meet the Pritikin guidelines. The purpose of this lesson is to provide patients with the opportunity to review, discuss, and practice hands-on learning of the Pritikin Label Reading guidelines with actual packaged food labels. Cooking School  Pritikin's [employer] are designed to teach patients ways to prepare quick, simple, and affordable recipes at home. The importance of nutrition's role in chronic disease risk reduction is reflected in its emphasis in the overall Pritikin program. By learning how to prepare essential core Pritikin Eating Plan recipes, patients will increase control over what they eat; be able to customize the flavor of foods without the use of added salt, sugar, or fat; and improve the quality of the food they consume. By learning a set of core recipes which are easily assembled, quickly prepared, and affordable, patients are more likely to prepare more healthy foods at home. These workshops focus on convenient breakfasts, simple entres, side dishes, and desserts which can be prepared with minimal effort and are consistent with nutrition recommendations for cardiovascular risk reduction. Cooking [employer] are taught by a Armed forces logistics/support/administrative officer (RD) who has been trained by the [employer]. The chef or RD has a clear understanding of the importance of minimizing - if not completely eliminating - added fat, sugar, and sodium in recipes. Throughout the series of Cooking School Workshop sessions, patients will learn about healthy ingredients and efficient methods of cooking to build confidence in their capability to  prepare    Cooking School weekly topics:  Adding Flavor- Sodium-Free  Fast and Healthy Breakfasts  Powerhouse Plant-Based Proteins  Satisfying Salads and Dressings  Simple Sides and Sauces  International Cuisine-Spotlight on the [employer] Zones  Delicious Desserts  Savory Soups  [employer] - Meals in a Astronomer Appetizers and Snacks  Comforting Weekend Breakfasts  One-Pot Wonders   Fast Evening Meals  Easy Entertaining  Personalizing Your Pritikin Plate  WORKSHOPS   Healthy Mindset (Psychosocial): New Thoughts, New Behaviors Clinical staff led group instruction and group discussion with PowerPoint presentation and patient guidebook. To enhance the learning environment the use of posters, models and videos may be added. Patients will learn and practice techniques for developing effective health and lifestyle goals. Patients will be able to effectively apply the goal setting process learned to develop at least one new personal goal.  The purpose of this lesson is to expose patients to a new skill set of behavior modification techniques such as techniques setting SMART goals, overcoming barriers, and achieving new thoughts and new behaviors.  Managing Moods and Relationships Clinical staff led group instruction and group discussion with PowerPoint presentation and patient guidebook. To enhance the learning environment the use of posters, models and videos may be added. Patients will learn how emotional and chronic stress factors can impact their health and relationships. They will learn healthy ways to manage their moods and utilize positive coping mechanisms. In addition, ICR patients will learn ways to improve communication skills. The purpose of this lesson is to expose patients to ways of understanding how one's mood and health are intimately connected. Developing a healthy outlook can help build positive relationships and connections with others. Patients will understand the  importance of utilizing effective communication skills that include actively listening and being heard. They will learn and understand the importance of the "4 Cs" and especially Connections in fostering of a Healthy Mind-Set.  Healthy Sleep for a Healthy Heart Clinical staff led group instruction and group discussion with PowerPoint presentation and patient guidebook. To enhance the learning environment the use of posters, models and videos may be added. At the conclusion of this workshop, patients will be able to demonstrate knowledge of the importance of sleep to overall health, well-being, and quality of life. They will understand the symptoms of, and treatments for, common sleep disorders. Patients will also be able to identify daytime and nighttime behaviors which impact sleep, and they will be able to apply these tools to help manage sleep-related challenges. The purpose of this lesson is to provide patients with a general overview of sleep and outline the importance of quality sleep. Patients will learn about a few of the most common sleep disorders. Patients will also be introduced to the concept of "sleep hygiene," and discover ways to self-manage certain sleeping problems through simple daily behavior changes. Finally, the workshop will motivate patients by clarifying the links between quality sleep and their goals of heart-healthy living.   Recognizing and Reducing Stress Clinical staff led group instruction and group discussion with PowerPoint presentation and patient guidebook. To enhance the learning environment the use of posters, models and videos may be added. At the conclusion of this workshop, patients will be able to understand the types of stress reactions, differentiate between acute and chronic stress, and recognize the impact that chronic stress has on their health. They will also be able to apply different coping mechanisms, such as reframing negative self-talk. Patients will have the  opportunity to practice a variety of stress management techniques, such as deep abdominal breathing, progressive muscle relaxation, and/or guided imagery.  The purpose of this lesson is to educate patients on the role of stress in their lives and to provide healthy techniques for coping with it.  Learning Barriers/Preferences:  Learning Barriers/Preferences - 05/26/22 7829       Learning Barriers/Preferences   Learning Barriers Sight   wears glasses   Learning Preferences Written Material;Video;Computer/Internet;Pictoral  Education Topics:  Knowledge Questionnaire Score:  Knowledge Questionnaire Score - 05/26/22 0939       Knowledge Questionnaire Score   Pre Score 21/24             Core Components/Risk Factors/Patient Goals at Admission:  Personal Goals and Risk Factors at Admission - 05/26/22 0939       Core Components/Risk Factors/Patient Goals on Admission    Weight Management Yes;Obesity;Weight Loss    Intervention Weight Management: Develop a combined nutrition and exercise program designed to reach desired caloric intake, while maintaining appropriate intake of nutrient and fiber, sodium and fats, and appropriate energy expenditure required for the weight goal.;Weight Management: Provide education and appropriate resources to help participant work on and attain dietary goals.;Weight Management/Obesity: Establish reasonable short term and long term weight goals.;Obesity: Provide education and appropriate resources to help participant work on and attain dietary goals.    Admit Weight 222 lb 3.6 oz (100.8 kg)    Goal Weight: Long Term 195 lb (88.5 kg)   pt goal   Expected Outcomes Short Term: Continue to assess and modify interventions until short term weight is achieved;Long Term: Adherence to nutrition and physical activity/exercise program aimed toward attainment of established weight goal;Weight Maintenance: Understanding of the daily nutrition guidelines,  which includes 25-35% calories from fat, 7% or less cal from saturated fats, less than 200mg  cholesterol, less than 1.5gm of sodium, & 5 or more servings of fruits and vegetables daily;Weight Loss: Understanding of general recommendations for a balanced deficit meal plan, which promotes 1-2 lb weight loss per week and includes a negative energy balance of [phone] kcal/d;Understanding recommendations for meals to include 15-35% energy as protein, 25-35% energy from fat, 35-60% energy from carbohydrates, less than 200mg  of dietary cholesterol, 20-35 gm of total fiber daily;Understanding of distribution of calorie intake throughout the day with the consumption of 4-5 meals/snacks    Heart Failure Yes    Intervention Provide a combined exercise and nutrition program that is supplemented with education, support and counseling about heart failure. Directed toward relieving symptoms such as shortness of breath, decreased exercise tolerance, and extremity edema.    Expected Outcomes Short term: Attendance in program 2-3 days a week with increased exercise capacity. Reported lower sodium intake. Reported increased fruit and vegetable intake. Reports medication compliance.;Short term: Daily weights obtained and reported for increase. Utilizing diuretic protocols set by physician.;Long term: Adoption of self-care skills and reduction of barriers for early signs and symptoms recognition and intervention leading to self-care maintenance.    Hypertension Yes    Intervention Provide education on lifestyle modifcations including regular physical activity/exercise, weight management, moderate sodium restriction and increased consumption of fresh fruit, vegetables, and low fat dairy, alcohol moderation, and smoking cessation.;Monitor prescription use compliance.    Expected Outcomes Short Term: Continued assessment and intervention until BP is < 140/22mm HG in hypertensive participants. < 130/59mm HG in hypertensive  participants with diabetes, heart failure or chronic kidney disease.;Long Term: Maintenance of blood pressure at goal levels.    Lipids Yes    Intervention Provide education and support for participant on nutrition & aerobic/resistive exercise along with prescribed medications to achieve LDL 70mg , HDL >40mg .    Expected Outcomes Short Term: Participant states understanding of desired cholesterol values and is compliant with medications prescribed. Participant is following exercise prescription and nutrition guidelines.;Long Term: Cholesterol controlled with medications as prescribed, with individualized exercise RX and with personalized nutrition plan. Value goals: LDL < 70mg , HDL > 40 mg.  Core Components/Risk Factors/Patient Goals Review:   Goals and Risk Factor Review     Row Name 06/01/22 1308 06/23/22 1338           Core Components/Risk Factors/Patient Goals Review   Personal Goals Review Weight Management/Obesity;Stress;Heart Failure;Hypertension;Lipids Weight Management/Obesity;Stress;Heart Failure;Hypertension;Lipids      Review Hense started intensive cardiac rehab on 06/01/22 and did well with exercise. Vital signs were stable Hense is doing well with exercise at intensive cardiac rehab. Vital signs have been stable. Hense has increased his work load and says that cardiac rehab has been helpful for him      Expected Outcomes Hense will continue to participate in intensive cardiac rehab for exercise, nutrition and lifestyle modifications Hense will continue to participate in intensive cardiac rehab for exercise, nutrition and lifestyle modifications               Core Components/Risk Factors/Patient Goals at Discharge (Final Review):   Goals and Risk Factor Review - 06/23/22 1338       Core Components/Risk Factors/Patient Goals Review   Personal Goals Review Weight Management/Obesity;Stress;Heart Failure;Hypertension;Lipids    Review Hense is doing well with  exercise at intensive cardiac rehab. Vital signs have been stable. Hense has increased his work load and says that cardiac rehab has been helpful for him    Expected Outcomes Hense will continue to participate in intensive cardiac rehab for exercise, nutrition and lifestyle modifications             ITP Comments:  ITP Comments     Row Name 05/26/22 0815 06/01/22 1306 06/23/22 1337       ITP Comments Dr Armanda Magic MD, Medical Direcotr, Introduction to Pritikin Education Program/ Intensive Cardiac Rehab. Initial Orientation Packet Reviewed with the patient 30 Day ITP Reivew. Hense started intensive cardiac rehab on 05/31/22 and did well with exercise 30 Day ITP Reivew. Hense has good attendance and participation in intensive cardiac rehab              Comments: See ITP comments.Thayer Headings RN BSN

## 2022-06-23 NOTE — Progress Notes (Signed)
[address]       [place] [address]             [phone]    HPI: [name] returns for a follow-up visit after coronary bypass grafting  Michael Hart is a 37 year old man with a history of coronary disease, hypertension, asthma, gout, and nephrolithiasis.  He presented back in October with an ST elevation MI.  He had an angioplasty of his LAD but had severe residual disease.  I did coronary bypass grafting x 2 on 03/27/2022 with bilateral mammary arteries.  He went home on postoperative day #4.  He developed a superficial dehiscence of his sternal wound but that eventually healed.  I last saw him on May 19, 2022 and he was making good progress at that time.  He now returns to discuss return to work.  He continues to improve.  He does still have discomfort particularly when he tries to do work with his upper body.  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  Past Medical History:  Diagnosis Date   Allergy    Asthma    Coronary artery disease    Gout    Kidney stone    Panic attacks    nocturnal, controls with self directed biofeedback   Primary hypertension 03/03/2022    Current Outpatient Medications  Medication Sig Dispense Refill   allopurinol (ZYLOPRIM) 100 MG tablet Take 2 tablets (200 mg total) by mouth daily. 60 tablet 2   aspirin EC 81 MG tablet Take 1 tablet (81 mg total) by mouth daily. Swallow whole. 30 tablet 12   cetirizine (ZYRTEC) 10 MG tablet Take 10 mg by mouth daily as needed for allergies.     colchicine 0.6 MG tablet Take 1 tablet (0.6 mg total) by mouth 2 (two) times daily. Take at onset of foot or ankle pain. (Patient taking differently: Take 0.6 mg by mouth 2 (two) times daily as needed (Take at onset of foot or ankle pain.).) 30 tablet 0   metoprolol tartrate (LOPRESSOR) 50 MG tablet Take 1 tablet (50 mg total) by mouth 2 (two) times daily. 60 tablet 3   rizatriptan (MAXALT-MLT) 10 MG disintegrating tablet  Dissolve 1 tablet (10 mg total) by mouth as needed for migraine. May repeat in 2 hours if needed 9 tablet 11   rosuvastatin (CRESTOR) 20 MG tablet Take 1 tablet (20 mg total) by mouth daily. 30 tablet 3   ticagrelor (BRILINTA) 90 MG TABS tablet Take 1 tablet (90 mg total) by mouth 2 (two) times daily. 60 tablet 3   acetaminophen (TYLENOL) 500 MG tablet Take 1-2 tablets (500-1,000 mg total) by mouth every 6 (six) hours. (Patient not taking: Reported on 06/23/2022) 30 tablet 0   oxyCODONE (OXY IR/ROXICODONE) 5 MG immediate release tablet Take 1 tablet (5 mg total) by mouth every 4 (four) hours as needed for severe pain. (Patient not taking: Reported on 05/25/2022) 30 tablet 0   No current facility-administered medications for this visit.    Physical Exam BP (!) 144/91 (BP Location: Left Arm, Patient Position: Sitting, Cuff Size: Large)   Pulse 98   Resp 20   Ht 5\' 9"  (1.753 m)   Wt 222 lb (100.7 kg)   SpO2 98% Comment: RA  BMI 32.59 kg/m  37 year old man in no acute distress Alert and oriented x 3 with no focal deficits Cardiac regular rate and rhythm, normal S1 and S2 Lungs clear  with equal breath sounds bilaterally Sternum stable, incision healed  Impression: Michael Hart is a 37 year old man with a history of coronary disease, hypertension, asthma, gout, and nephrolithiasis.  He presented back in October with an ST elevation MI.  He had an angioplasty of his LAD but had severe residual disease.  I did coronary bypass grafting x 2 on 03/27/2022 with bilateral mammary arteries.    Overall he is doing well.  I think he is pretty close to being able to go back to work.  We set a return to work date for 07/06/2022.  I did ask that he not lift anything over 10 kg or 25 pounds for at least another month after that.   Plan: He will follow-up with Dr. Virgina Jock for his cardiology care I will be happy to see him back by and be of any further assistance with his care  Melrose Nakayama, MD [facility] [phone]

## 2022-06-24 ENCOUNTER — Encounter (HOSPITAL_COMMUNITY)
Admission: RE | Admit: 2022-06-24 | Discharge: 2022-06-24 | Disposition: A | Discharge status: Home / Self Care | Payer: 59 | Source: Ambulatory Visit | Attending: Cardiology | Admitting: Cardiology

## 2022-06-24 DIAGNOSIS — I2102 ST elevation (STEMI) myocardial infarction involving left anterior descending coronary artery: Secondary | ICD-10-CM | POA: Diagnosis not present

## 2022-06-24 DIAGNOSIS — Z951 Presence of aortocoronary bypass graft: Secondary | ICD-10-CM | POA: Insufficient documentation

## 2022-06-26 ENCOUNTER — Encounter (HOSPITAL_COMMUNITY)
Admission: RE | Admit: 2022-06-26 | Discharge: 2022-06-26 | Disposition: A | Discharge status: Home / Self Care | Payer: 59 | Source: Ambulatory Visit | Attending: Cardiology | Admitting: Cardiology

## 2022-06-26 DIAGNOSIS — I2102 ST elevation (STEMI) myocardial infarction involving left anterior descending coronary artery: Secondary | ICD-10-CM

## 2022-06-26 DIAGNOSIS — Z951 Presence of aortocoronary bypass graft: Secondary | ICD-10-CM

## 2022-06-29 ENCOUNTER — Encounter (HOSPITAL_COMMUNITY)
Admission: RE | Admit: 2022-06-29 | Discharge: 2022-06-29 | Disposition: A | Discharge status: Home / Self Care | Payer: 59 | Source: Ambulatory Visit | Attending: Cardiology | Admitting: Cardiology

## 2022-06-29 DIAGNOSIS — I2102 ST elevation (STEMI) myocardial infarction involving left anterior descending coronary artery: Secondary | ICD-10-CM

## 2022-06-29 DIAGNOSIS — Z951 Presence of aortocoronary bypass graft: Secondary | ICD-10-CM | POA: Diagnosis not present

## 2022-06-30 ENCOUNTER — Other Ambulatory Visit (HOSPITAL_COMMUNITY): Payer: Self-pay

## 2022-06-30 ENCOUNTER — Other Ambulatory Visit: Payer: Self-pay | Admitting: Family Medicine

## 2022-06-30 DIAGNOSIS — M1A9XX0 Chronic gout, unspecified, without tophus (tophi): Secondary | ICD-10-CM

## 2022-06-30 MED ORDER — COLCHICINE 0.6 MG PO TABS
0.6000 mg | ORAL_TABLET | Freq: Two times a day (BID) | ORAL | 0 refills | Status: DC
  Filled 2022-06-30: qty 30, 15d supply, fill #0

## 2022-06-30 NOTE — Progress Notes (Signed)
Reviewed home exercise Rx with patient today.  Encouraged warm-up, cool-down, and stretching. Reviewed THRR of  73 - 146 and keeping RPE between 11-13. Encouraged to hydrate with activity.  Reviewed weather parameters for temperature and humidity for safe exercise outdoors. Reviewed S/S to terminate exercise and when to call 911 vs MD. Pt encouraged to always carry a cell phone for safety when exercising outdoors. Pt verbalized understanding of the home exercise Rx and was provided a copy.   Lesly Rubenstein MS, ACSM-CEP, CCRP

## 2022-07-01 ENCOUNTER — Encounter (HOSPITAL_COMMUNITY): Payer: 59

## 2022-07-01 ENCOUNTER — Telehealth (HOSPITAL_COMMUNITY): Payer: Self-pay | Admitting: *Deleted

## 2022-07-01 ENCOUNTER — Other Ambulatory Visit (HOSPITAL_COMMUNITY): Payer: Self-pay

## 2022-07-01 ENCOUNTER — Other Ambulatory Visit: Payer: Self-pay

## 2022-07-01 NOTE — Telephone Encounter (Signed)
Patient left message on department voicemail to say he will be absent from cardiac rehab today due to his kids' school being on a 2-hour delay. Would like to add session at end and plans to return on Friday 07/03/22.

## 2022-07-03 ENCOUNTER — Encounter (HOSPITAL_COMMUNITY)
Admission: RE | Admit: 2022-07-03 | Discharge: 2022-07-03 | Disposition: A | Discharge status: Home / Self Care | Payer: 59 | Source: Ambulatory Visit | Attending: Cardiology | Admitting: Cardiology

## 2022-07-03 DIAGNOSIS — Z951 Presence of aortocoronary bypass graft: Secondary | ICD-10-CM

## 2022-07-03 DIAGNOSIS — I2102 ST elevation (STEMI) myocardial infarction involving left anterior descending coronary artery: Secondary | ICD-10-CM

## 2022-07-06 ENCOUNTER — Encounter (HOSPITAL_COMMUNITY): Payer: 59

## 2022-07-06 ENCOUNTER — Telehealth (HOSPITAL_COMMUNITY): Payer: Self-pay

## 2022-07-06 ENCOUNTER — Encounter (HOSPITAL_COMMUNITY)
Admission: RE | Admit: 2022-07-06 | Discharge: 2022-07-06 | Disposition: A | Discharge status: Home / Self Care | Payer: 59 | Source: Ambulatory Visit | Attending: Cardiology | Admitting: Cardiology

## 2022-07-06 DIAGNOSIS — I2102 ST elevation (STEMI) myocardial infarction involving left anterior descending coronary artery: Secondary | ICD-10-CM

## 2022-07-06 DIAGNOSIS — Z951 Presence of aortocoronary bypass graft: Secondary | ICD-10-CM | POA: Diagnosis not present

## 2022-07-06 NOTE — Telephone Encounter (Signed)
-----  Message from Nigel Mormon, MD sent at 07/06/2022  1:27 PM EST ----- Regarding: RE: Jog order for [facility]" [place] 09-02-1985 [place] with me.  Thanks MJP   ----- Message ----- From: Lesly Rubenstein Sent: 06/30/2022   8:31 AM EST To: Nigel Mormon, MD Subject: Jog order for Gwyndolyn SaxonHense" [place] [birth year]#  Good morning Dr. Virgina Jock,  Your patient Gwyndolyn Saxon "Hense" Wolfgang [place] (1985-11-02) has been in the [facility] program for 4 weeks and is doing well. Hense would like to begin jogging. Current MET level is 4.77. Please let us know if you feel he is appropriate for him to begin jogging at his time. We appreciate your assistance and referral of this patient to our program.   Best Regards,  Lesly Rubenstein MS, ACSM-CEP, CCRP

## 2022-07-08 ENCOUNTER — Encounter (HOSPITAL_COMMUNITY): Payer: 59

## 2022-07-10 ENCOUNTER — Encounter (HOSPITAL_COMMUNITY): Payer: 59

## 2022-07-11 ENCOUNTER — Other Ambulatory Visit: Payer: Self-pay | Admitting: Physician Assistant

## 2022-07-13 ENCOUNTER — Encounter (HOSPITAL_COMMUNITY): Payer: 59

## 2022-07-14 ENCOUNTER — Ambulatory Visit: Payer: 59

## 2022-07-15 ENCOUNTER — Encounter (HOSPITAL_COMMUNITY): Payer: 59

## 2022-07-17 ENCOUNTER — Encounter (HOSPITAL_COMMUNITY): Payer: 59

## 2022-07-17 ENCOUNTER — Telehealth (HOSPITAL_COMMUNITY): Payer: Self-pay | Admitting: *Deleted

## 2022-07-17 NOTE — Telephone Encounter (Signed)
Spoke with Hense, he has returned to work and will not be complete the program. Will discharge from cardiac rehab at this time.Barnet Pall, RN,BSN 07/17/2022 8:52 AM

## 2022-07-20 ENCOUNTER — Encounter (HOSPITAL_COMMUNITY): Payer: 59

## 2022-07-22 ENCOUNTER — Encounter (HOSPITAL_COMMUNITY): Payer: 59

## 2022-07-23 NOTE — Progress Notes (Signed)
Discharge Progress Report  Patient Details  Name: Michael Hart MRN: [id number] Date of Birth: 03-07-1986 Referring Provider:   Flowsheet Row INTENSIVE CARDIAC REHAB ORIENT from 05/26/2022 in [facility]  Referring Provider Truett Mainland, MD        Number of Visits: 25  Reason for Discharge:  Early Exit:  Back to work  Smoking History:  Social History   Tobacco Use  Smoking Status Former   Packs/day: 0.50   Years: 12.00   Total pack years: 6.00   Types: Cigarettes   Quit date: 2013   Years since quitting: 11.0  Smokeless Tobacco Never    Diagnosis:  03/21/22 STEMI, S/P DES prox LAD  03/27/22 S/P CABG x 2  ADL UCSD:   Initial Exercise Prescription:  Initial Exercise Prescription - 05/26/22 0900       Date of Initial Exercise RX and Referring Provider   Date 05/26/22    Referring Provider Truett Mainland, MD    Expected Discharge Date 07/24/22      Treadmill   MPH 3.2    Grade 2    Minutes 15    METs 4.33      Biostep-RELP   Level 2    SPM 60    Minutes 15    METs 4.6      Prescription Details   Frequency (times per week) 3    Duration Progress to 30 minutes of continuous aerobic without signs/symptoms of physical distress      Intensity   THRR 40-80% of Max Heartrate 73-146    Ratings of Perceived Exertion 11-13    Perceived Dyspnea 0-4      Progression   Progression Continue progressive overload as per policy without signs/symptoms or physical distress.      Resistance Training   Training Prescription Yes    Weight 4 lbs    Reps 10-15             Discharge Exercise Prescription (Final Exercise Prescription Changes):  Exercise Prescription Changes - 07/06/22 1631       Response to Exercise   Blood Pressure (Admit) 122/86    Blood Pressure (Exercise) 142/88    Blood Pressure (Exit) 112/70    Heart Rate (Admit) 64 bpm    Heart Rate (Exercise) 115 bpm    Heart  Rate (Exit) 73 bpm    Rating of Perceived Exertion (Exercise) 12    Symptoms None    Comments Pt's last day in the CRP2 program    Duration Progress to 30 minutes of  aerobic without signs/symptoms of physical distress    Intensity THRR unchanged      Progression   Progression Continue to progress workloads to maintain intensity without signs/symptoms of physical distress.    Average METs 4.5      Resistance Training   Training Prescription Yes    Weight 5 lbs    Reps 10-15    Time 10 Minutes      Interval Training   Interval Training No      Treadmill   MPH 3.2    Grade 3    Minutes 15    METs 4.77      Biostep-RELP   Level 6    Minutes 15    METs 4.3      Home Exercise Plan   Plans to continue exercise at Home (comment)    Frequency Add 2 additional days to program exercise  sessions.    Initial Home Exercises Provided 06/29/22             Functional Capacity:  6 Minute Walk     Row Name 05/26/22 0832         6 Minute Walk   Phase Initial     Distance 1520 feet     Walk Time 6 minutes     # of Rest Breaks 0     MPH 2.9     METS 4.6     RPE 9     Perceived Dyspnea  0     VO2 Peak 16.23     Symptoms No     Resting HR 75 bpm     Resting BP 112/80     Resting Oxygen Saturation  97 %     Exercise Oxygen Saturation  during 6 min walk 98 %     Max Ex. HR 96 bpm     Max Ex. BP 118/80     2 Minute Post BP 106/72              Psychological, QOL, Others - Outcomes: PHQ 2/9:    05/26/2022    8:18 AM 04/16/2022    8:17 AM 01/28/2022    8:50 AM 04/05/2017    2:22 PM  Depression screen PHQ 2/9  Decreased Interest 0 1 0 0  Down, Depressed, Hopeless 0 1 0 0  PHQ - 2 Score 0 2 0 0  Altered sleeping  2    Tired, decreased energy  2    Change in appetite  0    Feeling bad or failure about yourself   1    Trouble concentrating  1    Moving slowly or fidgety/restless  0    Suicidal thoughts  0    PHQ-9 Score  8    Difficult doing work/chores  Not  difficult at all      Quality of Life:  Quality of Life - 05/26/22 0936       Quality of Life   Select Quality of Life      Quality of Life Scores   Health/Function Pre 22.27 %    Socioeconomic Pre 23.71 %    Psych/Spiritual Pre 20.36 %    Family Pre 23.2 %    GLOBAL Pre 22.31 %             Personal Goals: Goals established at orientation with interventions provided to work toward goal.  Personal Goals and Risk Factors at Admission - 05/26/22 0939       Core Components/Risk Factors/Patient Goals on Admission    Weight Management Yes;Obesity;Weight Loss    Intervention Weight Management: Develop a combined nutrition and exercise program designed to reach desired caloric intake, while maintaining appropriate intake of nutrient and fiber, sodium and fats, and appropriate energy expenditure required for the weight goal.;Weight Management: Provide education and appropriate resources to help participant work on and attain dietary goals.;Weight Management/Obesity: Establish reasonable short term and long term weight goals.;Obesity: Provide education and appropriate resources to help participant work on and attain dietary goals.    Admit Weight 222 lb 3.6 oz (100.8 kg)    Goal Weight: Long Term 195 lb (88.5 kg)   pt goal   Expected Outcomes Short Term: Continue to assess and modify interventions until short term weight is achieved;Long Term: Adherence to nutrition and physical activity/exercise program aimed toward attainment of established weight goal;Weight Maintenance: Understanding of the daily nutrition  guidelines, which includes 25-35% calories from fat, 7% or less cal from saturated fats, less than 200mg  cholesterol, less than 1.5gm of sodium, & 5 or more servings of fruits and vegetables daily;Weight Loss: Understanding of general recommendations for a balanced deficit meal plan, which promotes 1-2 lb weight loss per week and includes a negative energy balance of [phone]  kcal/d;Understanding recommendations for meals to include 15-35% energy as protein, 25-35% energy from fat, 35-60% energy from carbohydrates, less than 200mg  of dietary cholesterol, 20-35 gm of total fiber daily;Understanding of distribution of calorie intake throughout the day with the consumption of 4-5 meals/snacks    Heart Failure Yes    Intervention Provide a combined exercise and nutrition program that is supplemented with education, support and counseling about heart failure. Directed toward relieving symptoms such as shortness of breath, decreased exercise tolerance, and extremity edema.    Expected Outcomes Short term: Attendance in program 2-3 days a week with increased exercise capacity. Reported lower sodium intake. Reported increased fruit and vegetable intake. Reports medication compliance.;Short term: Daily weights obtained and reported for increase. Utilizing diuretic protocols set by physician.;Long term: Adoption of self-care skills and reduction of barriers for early signs and symptoms recognition and intervention leading to self-care maintenance.    Hypertension Yes    Intervention Provide education on lifestyle modifcations including regular physical activity/exercise, weight management, moderate sodium restriction and increased consumption of fresh fruit, vegetables, and low fat dairy, alcohol moderation, and smoking cessation.;Monitor prescription use compliance.    Expected Outcomes Short Term: Continued assessment and intervention until BP is < 140/64mm HG in hypertensive participants. < 130/35mm HG in hypertensive participants with diabetes, heart failure or chronic kidney disease.;Long Term: Maintenance of blood pressure at goal levels.    Lipids Yes    Intervention Provide education and support for participant on nutrition & aerobic/resistive exercise along with prescribed medications to achieve LDL 70mg , HDL >40mg .    Expected Outcomes Short Term: Participant states  understanding of desired cholesterol values and is compliant with medications prescribed. Participant is following exercise prescription and nutrition guidelines.;Long Term: Cholesterol controlled with medications as prescribed, with individualized exercise RX and with personalized nutrition plan. Value goals: LDL < 70mg , HDL > 40 mg.              Personal Goals Discharge:  Goals and Risk Factor Review     Row Name 06/01/22 1308 06/23/22 1338           Core Components/Risk Factors/Patient Goals Review   Personal Goals Review Weight Management/Obesity;Stress;Heart Failure;Hypertension;Lipids Weight Management/Obesity;Stress;Heart Failure;Hypertension;Lipids      Review Hense started intensive cardiac rehab on 06/01/22 and did well with exercise. Vital signs were stable Hense is doing well with exercise at intensive cardiac rehab. Vital signs have been stable. Hense has increased his work load and says that cardiac rehab has been helpful for him      Expected Outcomes Hense will continue to participate in intensive cardiac rehab for exercise, nutrition and lifestyle modifications Hense will continue to participate in intensive cardiac rehab for exercise, nutrition and lifestyle modifications               Exercise Goals and Review:  Exercise Goals     Row Name 05/26/22 0935             Exercise Goals   Increase Physical Activity Yes       Intervention Provide advice, education, support and counseling about physical activity/exercise needs.;Develop an individualized exercise prescription  for aerobic and resistive training based on initial evaluation findings, risk stratification, comorbidities and participant's personal goals.       Expected Outcomes Short Term: Attend rehab on a regular basis to increase amount of physical activity.;Long Term: Add in home exercise to make exercise part of routine and to increase amount of physical activity.;Long Term: Exercising regularly at  least 3-5 days a week.       Increase Strength and Stamina Yes       Intervention Provide advice, education, support and counseling about physical activity/exercise needs.;Develop an individualized exercise prescription for aerobic and resistive training based on initial evaluation findings, risk stratification, comorbidities and participant's personal goals.       Expected Outcomes Short Term: Increase workloads from initial exercise prescription for resistance, speed, and METs.;Short Term: Perform resistance training exercises routinely during rehab and add in resistance training at home;Long Term: Improve cardiorespiratory fitness, muscular endurance and strength as measured by increased METs and functional capacity ( )       Able to understand and use rate of perceived exertion (RPE) scale Yes       Intervention Provide education and explanation on how to use RPE scale       Expected Outcomes Short Term: Able to use RPE daily in rehab to express subjective intensity level;Long Term:  Able to use RPE to guide intensity level when exercising independently       Knowledge and understanding of Target Heart Rate Range (THRR) Yes       Intervention Provide education and explanation of THRR including how the numbers were predicted and where they are located for reference       Expected Outcomes Short Term: Able to state/look up THRR;Short Term: Able to use daily as guideline for intensity in rehab;Long Term: Able to use THRR to govern intensity when exercising independently       Understanding of Exercise Prescription Yes       Intervention Provide education, explanation, and written materials on patient's individual exercise prescription       Expected Outcomes Short Term: Able to explain program exercise prescription;Long Term: Able to explain home exercise prescription to exercise independently                Exercise Goals Re-Evaluation:  Exercise Goals Re-Evaluation     Row Name 06/01/22  1058 06/29/22 1015           Exercise Goal Re-Evaluation   Exercise Goals Review Increase Physical Activity;Increase Strength and Stamina;Able to understand and use rate of perceived exertion (RPE) scale;Knowledge and understanding of Target Heart Rate Range (THRR);Understanding of Exercise Prescription Increase Physical Activity;Increase Strength and Stamina;Able to understand and use rate of perceived exertion (RPE) scale;Knowledge and understanding of Target Heart Rate Range (THRR);Understanding of Exercise Prescription      Comments Pt's first day in the CRP2 program. Pt understands the exercise Rx, RPE scale and THRR. Reviewed goals and home exercise Rx. Pt is making progress with peak METs of 4.77. Pt is exercisng at home on his stationary bike and walking for 30+ minutes 2-3x/week in addtion to the CRP2 program. Pt has improve his strength and stamina.      Expected Outcomes Will continue to monitor patient and progress exercise workloads as tolerated. Will continue to monitor patient and progress exercise workloads as tolerated. Pt will continue to exercise at home.               Nutrition & Weight - Outcomes:  Pre  Biometrics - 05/26/22 0800       Pre Biometrics   Waist Circumference 44 inches    Hip Circumference 40 inches    Waist to Hip Ratio 1.1 %    Triceps Skinfold 21 mm    % Body Fat 31.4 %    Grip Strength 46 kg    Flexibility 11.5 in    Single Leg Stand 30 seconds              Nutrition:  Nutrition Therapy & Goals - 06/29/22 1013       Nutrition Therapy   Diet Heart Healthy Diet    Drug/Food Interactions Statins/Certain Fruits;Purine/Gout      Personal Nutrition Goals   Nutrition Goal Patient to identify strategies for improving cardiovascular risk by attending the weekly [employer] education and nutrition series.    Personal Goal #2 Patient to identify strategies for weight loss of 0.5-2.0# per week of weight loss.    Personal Goal #3 Patient to reduce  sodium to 1500mg  per day    Personal Goal #4 Patient to identify food sources and limit daily intake of of saturated fat, trans fat, sodium, and refined carbohydrates    Comments Goals in action. Hense has started making many dietary changes including reduced fast food/calorie dense foods and increased high fiber foods. He continues to attend the [employer] education series regularly. He is planning to start back to work later this month and plans to meal prep and pack lunch/snacks daily. His girlfriend is very supportive of lifestyle changes. He has not started CPAP yet.      Intervention Plan   Intervention Prescribe, educate and counsel regarding individualized specific dietary modifications aiming towards targeted core components such as weight, hypertension, lipid management, diabetes, heart failure and other comorbidities.;Nutrition handout(s) given to patient.    Expected Outcomes Short Term Goal: Understand basic principles of dietary content, such as calories, fat, sodium, cholesterol and nutrients.;Long Term Goal: Adherence to prescribed nutrition plan.             Nutrition Discharge:  Nutrition Assessments - 05/26/22 1127       Rate Your Plate Scores   Pre Score 79             Education Questionnaire Score:  Knowledge Questionnaire Score - 05/26/22 0939       Knowledge Questionnaire Score   Pre Score 21/24             Hense  completed   Intensive/Traditional cardiac rehab program 07/06/22  with completion of  24 exercise and education sessions. Pt maintained good attendance and progressed nicely during their participation in rehab as evidenced by increased MET level. Hense did not complete the program as he returned to work. We are proud of Hense's participation in the program. Hense said that participating in intensive cardiac rehab has been helpful to him. Harrell Gave RN BSN

## 2022-07-24 ENCOUNTER — Encounter (HOSPITAL_COMMUNITY): Payer: 59

## 2022-07-31 ENCOUNTER — Other Ambulatory Visit: Payer: Self-pay | Admitting: Family Medicine

## 2022-07-31 ENCOUNTER — Other Ambulatory Visit: Payer: Self-pay | Admitting: Physician Assistant

## 2022-07-31 ENCOUNTER — Other Ambulatory Visit (HOSPITAL_COMMUNITY): Payer: Self-pay

## 2022-07-31 DIAGNOSIS — M1A9XX0 Chronic gout, unspecified, without tophus (tophi): Secondary | ICD-10-CM

## 2022-07-31 MED ORDER — ALLOPURINOL 100 MG PO TABS
200.0000 mg | ORAL_TABLET | Freq: Every day | ORAL | 2 refills | Status: DC
  Filled 2022-07-31: qty 60, 30d supply, fill #0
  Filled 2022-08-31: qty 60, 30d supply, fill #1
  Filled 2022-09-28: qty 60, 30d supply, fill #2

## 2022-07-31 MED ORDER — CETIRIZINE HCL 10 MG PO TABS
10.0000 mg | ORAL_TABLET | Freq: Every day | ORAL | 1 refills | Status: AC | PRN
  Filled 2022-07-31 – 2022-10-27 (×2): qty 90, 90d supply, fill #0

## 2022-07-31 NOTE — Telephone Encounter (Signed)
1st fill with you.. ok to refill?  

## 2022-08-01 ENCOUNTER — Other Ambulatory Visit: Payer: Self-pay | Admitting: Physician Assistant

## 2022-08-01 ENCOUNTER — Other Ambulatory Visit: Payer: Self-pay | Admitting: Internal Medicine

## 2022-08-01 ENCOUNTER — Other Ambulatory Visit (HOSPITAL_COMMUNITY): Payer: Self-pay

## 2022-08-02 MED ORDER — METOPROLOL TARTRATE 50 MG PO TABS
50.0000 mg | ORAL_TABLET | Freq: Two times a day (BID) | ORAL | 3 refills | Status: DC
  Filled 2022-08-02: qty 60, 30d supply, fill #0
  Filled 2022-08-31: qty 60, 30d supply, fill #1
  Filled 2022-09-28: qty 60, 30d supply, fill #2
  Filled 2022-10-27 (×2): qty 60, 30d supply, fill #3

## 2022-08-03 ENCOUNTER — Encounter: Payer: Self-pay | Admitting: Family Medicine

## 2022-08-03 ENCOUNTER — Other Ambulatory Visit: Payer: Self-pay

## 2022-08-03 ENCOUNTER — Other Ambulatory Visit (HOSPITAL_COMMUNITY): Payer: Self-pay

## 2022-08-03 ENCOUNTER — Encounter: Payer: Self-pay | Admitting: Internal Medicine

## 2022-08-03 NOTE — Telephone Encounter (Signed)
Ok to refill rosuvastatin? This will be 1st fill with you.

## 2022-08-03 NOTE — Telephone Encounter (Signed)
From patient

## 2022-08-03 NOTE — Telephone Encounter (Signed)
He is suppoosed to be on brillinta. Can you please send

## 2022-08-04 ENCOUNTER — Other Ambulatory Visit (HOSPITAL_COMMUNITY): Payer: Self-pay

## 2022-08-04 MED ORDER — ROSUVASTATIN CALCIUM 20 MG PO TABS
20.0000 mg | ORAL_TABLET | Freq: Every day | ORAL | 0 refills | Status: DC
  Filled 2022-08-04: qty 90, 90d supply, fill #0

## 2022-08-04 MED ORDER — TICAGRELOR 90 MG PO TABS
90.0000 mg | ORAL_TABLET | Freq: Two times a day (BID) | ORAL | 3 refills | Status: DC
  Filled 2022-08-04: qty 60, 30d supply, fill #0
  Filled 2022-08-31: qty 60, 30d supply, fill #1

## 2022-08-19 ENCOUNTER — Ambulatory Visit: Payer: 59 | Admitting: Family Medicine

## 2022-08-31 ENCOUNTER — Other Ambulatory Visit (HOSPITAL_COMMUNITY): Payer: Self-pay

## 2022-08-31 ENCOUNTER — Telehealth: Payer: 59 | Admitting: Nurse Practitioner

## 2022-08-31 ENCOUNTER — Other Ambulatory Visit: Payer: Self-pay | Admitting: Family Medicine

## 2022-08-31 DIAGNOSIS — L309 Dermatitis, unspecified: Secondary | ICD-10-CM

## 2022-08-31 MED ORDER — TRIAMCINOLONE ACETONIDE 0.1 % EX CREA
1.0000 | TOPICAL_CREAM | Freq: Two times a day (BID) | CUTANEOUS | 0 refills | Status: DC
  Filled 2022-08-31: qty 30, 15d supply, fill #0

## 2022-08-31 NOTE — Progress Notes (Signed)
E-Visit for Eczema  We are sorry that you are not feeling well. Here is how we plan to help! Based on what you shared with me it looks like you have eczema (atopic dermatitis).  Although the cause of eczema is not completely understood, genetics appear to play a strong role, and people with a family history of eczema are at increased risk of developing the condition. In most people with eczema, there is a genetic abnormality in the outermost layer of the skin, called the epidermis   Most people with eczema develop their first symptoms as children, before the age of 30. Intense itching of the skin, patches of redness, small bumps, and skin flaking are common. Scratching can further inflame the skin and worsen the itching. The itchiness may be more noticeable at nighttime.  Eczema commonly affects the back of the neck, the elbow creases, and the backs of the knees. Other affected areas may include the face, wrists, and forearms. The skin may become thickened and darkened, or even scarred, from repeated scratching. Eliminating factors that aggravate your eczema symptoms can help to control the symptoms. Possible triggers may include: ? Cold or dry environments ? Sweating ? Emotional stress or anxiety ? Rapid temperature changes ? Exposure to certain chemicals or cleaning solutions, including soaps and detergents, perfumes and cosmetics, wool or synthetic fibers, dust, sand, and cigarette smoke Keeping your skin hydrated Emollients -- Emollients are creams and ointments that moisturize the skin and prevent it from drying out. The best emollients for people with eczema are thick creams (such as Eucerin, Cetaphil, and Nutraderm) or ointments (such as petroleum jelly, Aquaphor, and Vaseline), which contain little to no water. Emollients are most effective when applied immediately after bathing. Emollients can be applied twice a day or more often if needed. Lotions contain more water than creams and  ointments and are less effective for moisturizing the skin. Bathing -- It is not clear if showers or baths are better for keeping the skin hydrated. Lukewarm baths or showers can hydrate and cool the skin, temporarily relieving itching from eczema. An unscented, mild soap or non-soap cleanser (such as Cetaphil) should be used sparingly. Apply an emollient immediately after bathing or showering to prevent your skin from drying out as a result of water evaporation. Emollient bath additives (products you add to the bath water) have not been found to help relieve symptoms. Hot or long baths (more than 10 to 15 minutes) and showers should be avoided since they can dry out the skin.  Based on what you shared with me you may have eczema.   Triamcinalone ointment (or cream). Apply to the effected areas twice per day. This topical should not be used for longer than 14 days(two weeks)   I recommend dilute bleach baths for people with eczema. These baths help to decrease the number of bacteria on the skin that can cause infections or worsen symptoms. To prepare a bleach bath, one-fourth to one-half cup of bleach is placed in a full bathtub (about 40 gallons) of water. Bleach baths are usually taken for 5 to 10 minutes twice per week and should be followed by application of an emollient (listed above). I recommend you take Benadryl '25mg'$  - '50mg'$  every 4 hours to control the symptoms (including itching) but if they last over 24 hours it is best that you see an office based provider for follow up.  HOME CARE: Take lukewarm showers or baths Apply creams and ointments to prevent the skin  from drying (Eucerin, Cetaphil, Nutraderm, petroleum jelly, Aquaphor or Vaseline) - these products contain less water than other lotions and are more effective for moisturizing the skin Limit exposure to cold or dry environments, sweating, emotional stress and anxiety, rapid temperature changes and exposure to chemicals and cleaning  products, soaps and detergents, perfumes, cosmetics, wool and synthetic fibers, dust, sand and cigarette- factors which can aggravate eczema symptoms.  Use a hydrocortisone cream once or twice a day Take an antihistamine like Benadryl for widespread rashes that itch.  The adult dosage of Benadryl is 25-50 mg by mouth 4 times daily. Caution: This type of medication may cause sleepiness.  Do not drink alcohol, drive, or operate dangerous machinery while taking antihistamines.  Do not take these medications if you have prostate enlargement.  Read the package instructions thoroughly on all medications that you take.  GET HELP RIGHT AWAY IF: Symptoms that don't go away after treatment. Severe itching that persists. You develop a fever. Your skin begins to drain. You have a sore throat. You become short of breath.  MAKE SURE YOU   Understand these instructions. Will watch your condition. Will get help right away if you are not doing well or get worse.    Thank you for choosing an e-visit.  Your e-visit answers were reviewed by a board certified advanced clinical practitioner to complete your personal care plan. Depending upon the condition, your plan could have included both over the counter or prescription medications.  Please review your pharmacy choice. Make sure the pharmacy is open so you can pick up prescription now. If there is a problem, you may contact your provider through [employer] and have the prescription routed to another pharmacy.  Your safety is important to [place]. If you have drug allergies check your prescription carefully.   For the next 24 hours you can use MyChart to ask questions about today's visit, request a non-urgent call back, or ask for a work or school excuse. You will get an email in the next two days asking about your experience. I hope that your e-visit has been valuable and will speed your recovery.  Meds ordered this encounter  Medications    triamcinolone cream (KENALOG) 0.1 %    Sig: Apply 1 Application topically 2 (two) times daily.    Dispense:  30 g    Refill:  0    I spent approximately 5 minutes reviewing the patient's history, current symptoms and coordinating their care today.

## 2022-09-02 ENCOUNTER — Other Ambulatory Visit (HOSPITAL_COMMUNITY): Payer: Self-pay

## 2022-09-03 ENCOUNTER — Other Ambulatory Visit (HOSPITAL_COMMUNITY): Payer: Self-pay

## 2022-09-04 ENCOUNTER — Other Ambulatory Visit (HOSPITAL_COMMUNITY): Payer: Self-pay

## 2022-09-23 ENCOUNTER — Other Ambulatory Visit (HOSPITAL_COMMUNITY): Payer: Self-pay

## 2022-09-23 ENCOUNTER — Encounter: Payer: Self-pay | Admitting: Family Medicine

## 2022-09-23 ENCOUNTER — Other Ambulatory Visit: Payer: BC Managed Care – PPO

## 2022-09-23 ENCOUNTER — Ambulatory Visit: Payer: BC Managed Care – PPO | Admitting: Cardiology

## 2022-09-23 ENCOUNTER — Ambulatory Visit: Payer: BC Managed Care – PPO | Admitting: Family Medicine

## 2022-09-23 ENCOUNTER — Ambulatory Visit: Payer: BC Managed Care – PPO | Admitting: Internal Medicine

## 2022-09-23 VITALS — BP 128/86 | HR 75 | Temp 97.8°F | Ht 69.0 in | Wt 239.0 lb

## 2022-09-23 VITALS — BP 127/85 | HR 73 | Resp 16 | Ht 69.0 in | Wt 240.0 lb

## 2022-09-23 DIAGNOSIS — I10 Essential (primary) hypertension: Secondary | ICD-10-CM

## 2022-09-23 DIAGNOSIS — M1A9XX0 Chronic gout, unspecified, without tophus (tophi): Secondary | ICD-10-CM

## 2022-09-23 DIAGNOSIS — E669 Obesity, unspecified: Secondary | ICD-10-CM | POA: Diagnosis not present

## 2022-09-23 DIAGNOSIS — I2510 Atherosclerotic heart disease of native coronary artery without angina pectoris: Secondary | ICD-10-CM

## 2022-09-23 DIAGNOSIS — R002 Palpitations: Secondary | ICD-10-CM

## 2022-09-23 DIAGNOSIS — E782 Mixed hyperlipidemia: Secondary | ICD-10-CM | POA: Diagnosis not present

## 2022-09-23 DIAGNOSIS — E781 Pure hyperglyceridemia: Secondary | ICD-10-CM

## 2022-09-23 MED ORDER — TICAGRELOR 90 MG PO TABS
90.0000 mg | ORAL_TABLET | Freq: Two times a day (BID) | ORAL | 3 refills | Status: DC
  Filled 2022-09-23 – 2022-09-28 (×2): qty 180, 90d supply, fill #0
  Filled 2022-10-27 – 2022-12-04 (×3): qty 180, 90d supply, fill #1

## 2022-09-23 NOTE — Assessment & Plan Note (Signed)
Continue Crestor 20 mg daily. Low fat diet and exercise. Follow up pending lipid panel

## 2022-09-23 NOTE — Progress Notes (Signed)
Subjective:     Patient ID: Michael Hart, male    DOB: 03/20/1986, 37 y.o.   MRN: [id number]  Chief Complaint  Patient presents with   Medical Management of Chronic Issues    3 month f/u    HPI Patient is in today for follow up.   He saw his cardiologist earlier today.   No gout flare ups since he was last here.  Taking allopurinol 200 mg allopurinol.   Stopped red meat and other saturated fats.     Health Maintenance Due  Topic Date Due   COVID-19 Vaccine (1) Never done    Past Medical History:  Diagnosis Date   Allergy    Asthma    Coronary artery disease    Gout    Kidney stone    Panic attacks    nocturnal, controls with self directed biofeedback   Primary hypertension 03/03/2022    Past Surgical History:  Procedure Laterality Date   CARDIAC CATHETERIZATION     CORONARY ARTERY BYPASS GRAFT N/A 03/27/2022   Procedure: CORONARY ARTERY BYPASS GRAFTING (CABG) x2 , USING BILATERAL INTERNAL MAMMARY ARTERIES;  Surgeon: Melrose Nakayama, MD;  Location: [facility];  Service: Open Heart Surgery;  Laterality: N/A;   CORONARY/GRAFT ACUTE MI REVASCULARIZATION N/A 03/21/2022   Procedure: Coronary/Graft Acute MI Revascularization;  Surgeon: Nigel Mormon, MD;  Location: [facility];  Service: Cardiovascular;  Laterality: N/A;   CYSTOSCOPY N/A 03/27/2022   Procedure: CYSTOSCOPY FLEXIBLE, PLACEMENT OF FOLEY CATHETER;  Surgeon: Janith Lima, MD;  Location: [facility];  Service: Urology;  Laterality: N/A;   NO PAST SURGERIES     RADIAL ARTERY HARVEST Left 03/27/2022   Procedure: ATTEMPTED RADIAL ARTERY HARVEST;  Surgeon: Melrose Nakayama, MD;  Location: [facility];  Service: Open Heart Surgery;  Laterality: Left;   TEE WITHOUT CARDIOVERSION N/A 03/27/2022   Procedure: TRANSESOPHAGEAL ECHOCARDIOGRAM (TEE);  Surgeon: Melrose Nakayama, MD;  Location: [facility];  Service: Open Heart Surgery;  Laterality: N/A;    Family History  Problem Relation Age of Onset    Breast cancer Mother    Lung cancer Father    Heart disease Father        CABG   COPD Father    Hypertension Other    Stroke Other    Migraines Neg Hx    Headache Neg Hx    Sleep apnea Neg Hx     Social History   Socioeconomic History   Marital status: Divorced    Spouse name: Not on file   Number of children: 2   Years of education: 16   Highest education level: Bachelor's degree (e.g., BA, AB, BS)  Occupational History   Not on file  Tobacco Use   Smoking status: Former    Packs/day: 0.50    Years: 12.00    Additional pack years: 0.00    Total pack years: 6.00    Types: Cigarettes    Quit date: 2013    Years since quitting: 11.2   Smokeless tobacco: Never  Vaping Use   Vaping Use: Never used  Substance and Sexual Activity   Alcohol use: Yes    Alcohol/week: 2.0 standard drinks of alcohol    Types: 2 Glasses of wine per week    Comment: occasional   Drug use: No   Sexual activity: Not on file  Other Topics Concern   Not on file  Social History Narrative   Not on file  Social Determinants of Health   Financial Resource Strain: Medium Risk (09/23/2022)   Overall Financial Resource Strain (CARDIA)    Difficulty of Paying Living Expenses: Somewhat hard  Food Insecurity: No Food Insecurity (09/23/2022)   Hunger Vital Sign    Worried About Running Out of Food in the Last Year: Never true    Ran Out of Food in the Last Year: Never true  Transportation Needs: No Transportation Needs (09/23/2022)   PRAPARE - Hydrologist (Medical): No    Lack of Transportation (Non-Medical): No  Physical Activity: Insufficiently Active (09/23/2022)   Exercise Vital Sign    Days of Exercise per Week: 3 days    Minutes of Exercise per Session: 20 min  Stress: Stress Concern Present (09/23/2022)   [employer]    Feeling of Stress : To some extent  Social Connections: Moderately Isolated  (09/23/2022)   Social Connection and Isolation Panel [NHANES]    Frequency of Communication with Friends and Family: More than three times a week    Frequency of Social Gatherings with Friends and Family: Twice a week    Attends Religious Services: 1 to 4 times per year    Active Member of [employer] or Organizations: No    Attends Music therapist: Not on file    Marital Status: Divorced  Intimate Partner Violence: Not At Risk (03/21/2022)   Humiliation, Afraid, Rape, and Kick questionnaire    Fear of Current or Ex-Partner: No    Emotionally Abused: No    Physically Abused: No    Sexually Abused: No    Outpatient Medications Prior to Visit  Medication Sig Dispense Refill   acetaminophen (TYLENOL) 500 MG tablet Take 1-2 tablets (500-1,000 mg total) by mouth every 6 (six) hours. 30 tablet 0   allopurinol (ZYLOPRIM) 100 MG tablet Take 2 tablets (200 mg total) by mouth daily. 60 tablet 2   aspirin EC 81 MG tablet Take 1 tablet (81 mg total) by mouth daily. Swallow whole. 30 tablet 12   cetirizine (ZYRTEC) 10 MG tablet Take 1 tablet (10 mg total) by mouth daily as needed for allergies. 90 tablet 1   colchicine 0.6 MG tablet Take 1 tablet (0.6 mg total) by mouth 2 (two) times daily. Take at onset of foot or ankle pain. 30 tablet 0   metoprolol tartrate (LOPRESSOR) 50 MG tablet Take 1 tablet (50 mg total) by mouth 2 (two) times daily. 60 tablet 3   rosuvastatin (CRESTOR) 20 MG tablet Take 1 tablet (20 mg total) by mouth daily. 90 tablet 0   ticagrelor (BRILINTA) 90 MG TABS tablet Take 1 tablet (90 mg total) by mouth 2 (two) times daily. 180 tablet 3   triamcinolone cream (KENALOG) 0.1 % Apply topically 2 times daily. 30 g 0   rizatriptan (MAXALT-MLT) 10 MG disintegrating tablet Dissolve 1 tablet (10 mg total) by mouth as needed for migraine. May repeat in 2 hours if needed (Patient not taking: Reported on 06/23/2022) 9 tablet 11   No facility-administered medications prior to visit.     Allergies  Allergen Reactions   Penicillins Shortness Of Breath, Swelling and Other (See Comments)    Childhood reaction   Sulfa Antibiotics Shortness Of Breath, Swelling and Other (See Comments)    Childhood reaction    Review of Systems  Constitutional:  Negative for chills and fever.  Respiratory:  Negative for shortness of breath.   Cardiovascular:  Negative for chest pain, palpitations and leg swelling.  Gastrointestinal:  Negative for abdominal pain, constipation, diarrhea, nausea and vomiting.  Genitourinary:  Negative for dysuria, frequency and urgency.  Neurological:  Negative for dizziness.       Objective:    Physical Exam Constitutional:      General: He is not in acute distress.    Appearance: He is not ill-appearing.  Eyes:     Extraocular Movements: Extraocular movements intact.     Conjunctiva/sclera: Conjunctivae normal.  Cardiovascular:     Rate and Rhythm: Normal rate.  Pulmonary:     Effort: Pulmonary effort is normal.  Musculoskeletal:     Cervical back: Normal range of motion and neck supple.  Skin:    General: Skin is warm and dry.  Neurological:     General: No focal deficit present.     Mental Status: He is alert and oriented to person, place, and time.  Psychiatric:        Mood and Affect: Mood normal.        Behavior: Behavior normal.        Thought Content: Thought content normal.     BP 128/86 (BP Location: Left Arm, Patient Position: Sitting, Cuff Size: Large)   Pulse 75   Temp 97.8 F (36.6 C) (Temporal)   Ht 5\' 9"  (1.753 m)   Wt 239 lb (108.4 kg)   SpO2 98%   BMI 35.29 kg/m  Wt Readings from Last 3 Encounters:  09/23/22 239 lb (108.4 kg)  09/23/22 240 lb (108.9 kg)  06/23/22 230 lb (104.3 kg)       Assessment & Plan:   Problem List Items Addressed This Visit       Cardiovascular and Mediastinum   Primary hypertension - Primary    Eating a healthier diet, continue weight loss and exercise as tolerated.  Started  on metoprolol 50 mg bid after STEMI in hospital. Followed by cardiology      Relevant Orders   CBC with Differential/Platelet   Comprehensive metabolic panel     Other   Chronic gout without tophus    Continue allopurinol 200 mg daily. No flares since last visit. Has colchicine for prn. Continue low purine diet. Follow up pending uric acid level.       Relevant Orders   Uric acid   Hypertriglyceridemia    Continue Crestor 20 mg daily. Low fat diet and exercise. Follow up pending lipid panel      Relevant Orders   Lipid panel   Obesity (BMI 30-39.9)    Continue diet and exercise for weight loss       Relevant Orders   Hemoglobin A1c    I have discontinued Princess Bruins. Dunleavy "Hense"'s rizatriptan. I am also having him maintain his acetaminophen, aspirin EC, colchicine, allopurinol, cetirizine, metoprolol tartrate, rosuvastatin, triamcinolone cream, and ticagrelor.  No orders of the defined types were placed in this encounter.

## 2022-09-23 NOTE — Assessment & Plan Note (Signed)
-   Continue diet and exercise for weight loss

## 2022-09-23 NOTE — Progress Notes (Signed)
Follow up visit  Subjective:   Michael Hart, male    DOB: Nov 04, 1985, 37 y.o.   MRN: [id number]    HPI  Chief Complaint  Patient presents with   Coronary Artery Disease   Follow-up    37 y.o. Caucasian male with hypertension, hyperlipidemia, CAD  Patient presented with STEMI 02/2022, underwent PCI to LAD.  However, given his residual severe stenoses in proximal ramus, proximal left circumflex/OM1, and distal RCA, he also underwent CABG with LIMA-ramus, free RIMA-dRCA, in order to achieve complete revascularization.   Patient has been doing well.  He did a month of cardiac rehab, and has been walking/exercising regularly without any complaints of chest pain or shortness of breath.  He has complaints of palpitations from time to time, lasting for 1 minute at a time.  Episodes usually occur at rest.  Currently, they are for once every week or so.    Current Outpatient Medications:    acetaminophen (TYLENOL) 500 MG tablet, Take 1-2 tablets (500-1,000 mg total) by mouth every 6 (six) hours., Disp: 30 tablet, Rfl: 0   allopurinol (ZYLOPRIM) 100 MG tablet, Take 2 tablets (200 mg total) by mouth daily., Disp: 60 tablet, Rfl: 2   aspirin EC 81 MG tablet, Take 1 tablet (81 mg total) by mouth daily. Swallow whole., Disp: 30 tablet, Rfl: 12   cetirizine (ZYRTEC) 10 MG tablet, Take 1 tablet (10 mg total) by mouth daily as needed for allergies., Disp: 90 tablet, Rfl: 1   colchicine 0.6 MG tablet, Take 1 tablet (0.6 mg total) by mouth 2 (two) times daily. Take at onset of foot or ankle pain., Disp: 30 tablet, Rfl: 0   metoprolol tartrate (LOPRESSOR) 50 MG tablet, Take 1 tablet (50 mg total) by mouth 2 (two) times daily., Disp: 60 tablet, Rfl: 3   rosuvastatin (CRESTOR) 20 MG tablet, Take 1 tablet (20 mg total) by mouth daily., Disp: 90 tablet, Rfl: 0   ticagrelor (BRILINTA) 90 MG TABS tablet, Take 1 tablet (90 mg total) by mouth 2 (two) times daily., Disp: 60 tablet, Rfl: 3   triamcinolone  cream (KENALOG) 0.1 %, Apply topically 2 times daily., Disp: 30 g, Rfl: 0   rizatriptan (MAXALT-MLT) 10 MG disintegrating tablet, Dissolve 1 tablet (10 mg total) by mouth as needed for migraine. May repeat in 2 hours if needed (Patient not taking: Reported on 06/23/2022), Disp: 9 tablet, Rfl: 11   Cardiovascular & other pertient studies:  Reviewed external labs and tests, independently interpreted  EKG 09/23/2022: Sinus rhythm 66 bpm Old anterior infarct  Op note 03/27/2022 (Dr. Roxan Hockey): LIMA-RI, free RIMA-dRCA  Coronary intervention 03/21/2022: LM: Normal LAD: Prox 100% occlusion          Mid 80-90% stenoses          Diffuse 80% diag disease Ramus: Large vessel. Prox 75% stenosis Lcx: Mid 95%, OM1 95% bifurcation stenosis RCA: Mid 80%, followed by 75% stenoses          Distal 80% stenosis   Successful percutaneous coronary intervention prox-mid LAD     Aspiration thrombectomy, PTCA, and overlapping stents placement     Synergy DES 4.0X12 mm, 3.5X48 mm, 3.0X16 mm     This was necessitated by flow limiting lesions beyond the culprit lesion     Post dilataation with 4.0X15 mm [place] balloon upto 18 atm      We will consider non-culprit vessel revascularization to remaining vessels during index hospitalization.        Echocardiogram  03/21/2022: 1. Left ventricular ejection fraction, by estimation, is 40 to 45%. The  left ventricle has mildly decreased function. The left ventricle  demonstrates regional wall motion abnormalities (see scoring  diagram/findings for description). There is mild left  ventricular hypertrophy. Left ventricular diastolic parameters are  indeterminate. There is severe hypokinesis of the left ventricular,  mid-apical anteroseptal wall and anterior wall. There is akinesis of the  left ventricular, apical segment.   2. Right ventricular systolic function is normal. The right ventricular  size is normal.   3. The mitral valve is normal in structure. No  evidence of mitral valve  regurgitation. No evidence of mitral stenosis.   4. The aortic valve is normal in structure. Aortic valve regurgitation is  not visualized. No aortic stenosis is present.   Recent labs: 04/14/2022: Glucose 93, BUN/Cr 14/0.98. EGFR 97. Na/K 137/4.2. Rest of the CMP normal H/H 12/37. MCV 85. Platelets 497  03/21/2022: HbA1C 5.2% Chol 115, TG 227, HDL 25, LDL 62 Lipoprotein (a) 62 TSH 1.8 normal    Review of Systems  Cardiovascular:  Positive for palpitations. Negative for chest pain, dyspnea on exertion, leg swelling and syncope.         Vitals:   09/23/22 1255  BP: 127/85  Pulse: 73  Resp: 16  SpO2: 98%    Body mass index is 35.44 kg/m. Filed Weights   09/23/22 1255  Weight: 240 lb (108.9 kg)     Objective:   Physical Exam Vitals and nursing note reviewed.  Constitutional:      General: He is not in acute distress. Neck:     Vascular: No JVD.  Cardiovascular:     Rate and Rhythm: Normal rate and regular rhythm.     Heart sounds: Normal heart sounds. No murmur heard. Pulmonary:     Effort: Pulmonary effort is normal.     Breath sounds: Normal breath sounds. No wheezing or rales.  Musculoskeletal:     Right lower leg: No edema.     Left lower leg: No edema.             Visit diagnoses:   ICD-10-CM   1. Coronary artery disease involving native coronary artery of native heart without angina pectoris  I25.10 PCV ECHOCARDIOGRAM COMPLETE    EKG 12-Lead    2. Primary hypertension  I10     3. Mixed hyperlipidemia  E78.2        Orders Placed This Encounter  Procedures   EKG 12-Lead   PCV ECHOCARDIOGRAM COMPLETE     Medication changes this visit: Medications Discontinued During This Encounter  Medication Reason   ticagrelor (BRILINTA) 90 MG TABS tablet Reorder    Meds ordered this encounter  Medications   ticagrelor (BRILINTA) 90 MG TABS tablet    Sig: Take 1 tablet (90 mg total) by mouth 2 (two) times daily.     Dispense:  180 tablet    Refill:  3     Assessment & Recommendations:   37 y.o. Caucasian male with hypertension, hyperlipidemia, CAD  CAD: S/p primary PCI to LAD in setting of STEMI (02/2022), followed by CABG x 2 (LIMA-RI, left radials-d RCA). No angina symptoms at this time. Recommended DAPT with Aspirin and Brilinta till 02/2022. Then, can switch to aspirin 81 mg daily, and Xarelto 2.5 mg twice daily. Continue metoprolol tartrate 50 mg twice daily. LVEF 40-45% in the setting of STEMI.  Check echocardiogram now. Also, check lipid panel.  I remain suspicious that his LDL  was falsely low in the setting of STEMI. He has upcoming labs with PCP.  Palpitations: Suspect tachyarrhythmia, unlikely A-fib. Discussed vagal maneuvers. Recommend to be cardiac telemetry.       Nigel Mormon, MD Pager: [phone] Office: [phone]

## 2022-09-23 NOTE — Assessment & Plan Note (Signed)
Continue allopurinol 200 mg daily. No flares since last visit. Has colchicine for prn. Continue low purine diet. Follow up pending uric acid level.

## 2022-09-23 NOTE — Assessment & Plan Note (Signed)
Eating a healthier diet, continue weight loss and exercise as tolerated.  Started on metoprolol 50 mg bid after STEMI in hospital. Followed by cardiology

## 2022-09-24 ENCOUNTER — Other Ambulatory Visit (HOSPITAL_COMMUNITY): Payer: Self-pay

## 2022-09-24 LAB — LIPID PANEL
Cholesterol: 162 mg/dL (ref 0–200)
HDL: 34.5 mg/dL — ABNORMAL LOW (ref >39.00)
NonHDL: 127.74
Total CHOL/HDL Ratio: 5
Triglycerides: 273 mg/dL — ABNORMAL HIGH (ref 0.0–149.0)
VLDL: 54.6 mg/dL — ABNORMAL HIGH (ref 0.0–40.0)

## 2022-09-24 LAB — CBC WITH DIFFERENTIAL/PLATELET
Basophils Absolute: 0.1 10*3/uL (ref 0.0–0.1)
Basophils Relative: 1.1 % (ref 0.0–3.0)
Eosinophils Absolute: 0.2 10*3/uL (ref 0.0–0.7)
Eosinophils Relative: 1.9 % (ref 0.0–5.0)
HCT: 42.8 % (ref 39.0–52.0)
Hemoglobin: 15 g/dL (ref 13.0–17.0)
Lymphocytes Relative: 28.8 % (ref 12.0–46.0)
Lymphs Abs: 2.5 10*3/uL (ref 0.7–4.0)
MCHC: 34.9 g/dL (ref 30.0–36.0)
MCV: 85.1 fl (ref 78.0–100.0)
Monocytes Absolute: 0.6 10*3/uL (ref 0.1–1.0)
Monocytes Relative: 7.3 % (ref 3.0–12.0)
Neutro Abs: 5.3 10*3/uL (ref 1.4–7.7)
Neutrophils Relative %: 60.9 % (ref 43.0–77.0)
Platelets: 310 10*3/uL (ref 150.0–400.0)
RBC: 5.03 Mil/uL (ref 4.22–5.81)
RDW: 13.8 % (ref 11.5–15.5)
WBC: 8.8 10*3/uL (ref 4.0–10.5)

## 2022-09-24 LAB — COMPREHENSIVE METABOLIC PANEL
ALT: 20 U/L (ref 0–53)
AST: 19 U/L (ref 0–37)
Albumin: 4.9 g/dL (ref 3.5–5.2)
Alkaline Phosphatase: 56 U/L (ref 39–117)
BUN: 12 mg/dL (ref 6–23)
CO2: 27 mEq/L (ref 19–32)
Calcium: 9.6 mg/dL (ref 8.4–10.5)
Chloride: 102 mEq/L (ref 96–112)
Creatinine, Ser: 0.89 mg/dL (ref 0.40–1.50)
GFR: 108.38 mL/min (ref >60.00)
Glucose, Bld: 75 mg/dL (ref 70–99)
Potassium: 3.9 mEq/L (ref 3.5–5.1)
Sodium: 138 mEq/L (ref 135–145)
Total Bilirubin: 0.6 mg/dL (ref 0.2–1.2)
Total Protein: 7.7 g/dL (ref 6.0–8.3)

## 2022-09-24 LAB — LDL CHOLESTEROL, DIRECT: Direct LDL: 95 mg/dL

## 2022-09-24 LAB — URIC ACID: Uric Acid, Serum: 6.5 mg/dL (ref 4.0–7.8)

## 2022-09-24 LAB — HEMOGLOBIN A1C: Hgb A1c MFr Bld: 5.7 % (ref 4.6–6.5)

## 2022-09-24 NOTE — Progress Notes (Signed)
Please reach out with my recommendations. See if he has any questions. Thanks.

## 2022-09-25 ENCOUNTER — Encounter: Payer: Self-pay | Admitting: Cardiology

## 2022-09-25 NOTE — Telephone Encounter (Signed)
From patient.

## 2022-09-28 ENCOUNTER — Other Ambulatory Visit (HOSPITAL_COMMUNITY): Payer: Self-pay

## 2022-09-28 ENCOUNTER — Other Ambulatory Visit: Payer: Self-pay | Admitting: Family Medicine

## 2022-09-28 ENCOUNTER — Other Ambulatory Visit: Payer: Self-pay

## 2022-09-28 MED ORDER — ROSUVASTATIN CALCIUM 20 MG PO TABS
20.0000 mg | ORAL_TABLET | Freq: Every day | ORAL | 0 refills | Status: DC
  Filled 2022-09-28 – 2022-10-27 (×3): qty 90, 90d supply, fill #0

## 2022-10-19 ENCOUNTER — Emergency Department (HOSPITAL_COMMUNITY): Payer: BC Managed Care – PPO

## 2022-10-19 ENCOUNTER — Other Ambulatory Visit: Payer: Self-pay

## 2022-10-19 ENCOUNTER — Emergency Department (HOSPITAL_COMMUNITY)
Admission: EM | Admit: 2022-10-19 | Discharge: 2022-10-20 | Disposition: A | Discharge status: Home / Self Care | Payer: BC Managed Care – PPO | Attending: Emergency Medicine | Admitting: Emergency Medicine

## 2022-10-19 DIAGNOSIS — R2243 Localized swelling, mass and lump, lower limb, bilateral: Secondary | ICD-10-CM | POA: Insufficient documentation

## 2022-10-19 DIAGNOSIS — I10 Essential (primary) hypertension: Secondary | ICD-10-CM | POA: Insufficient documentation

## 2022-10-19 DIAGNOSIS — Z79899 Other long term (current) drug therapy: Secondary | ICD-10-CM | POA: Insufficient documentation

## 2022-10-19 DIAGNOSIS — J45909 Unspecified asthma, uncomplicated: Secondary | ICD-10-CM | POA: Insufficient documentation

## 2022-10-19 DIAGNOSIS — Z7982 Long term (current) use of aspirin: Secondary | ICD-10-CM | POA: Insufficient documentation

## 2022-10-19 DIAGNOSIS — Z951 Presence of aortocoronary bypass graft: Secondary | ICD-10-CM | POA: Insufficient documentation

## 2022-10-19 DIAGNOSIS — I2510 Atherosclerotic heart disease of native coronary artery without angina pectoris: Secondary | ICD-10-CM | POA: Insufficient documentation

## 2022-10-19 DIAGNOSIS — I493 Ventricular premature depolarization: Secondary | ICD-10-CM | POA: Diagnosis not present

## 2022-10-19 DIAGNOSIS — R002 Palpitations: Secondary | ICD-10-CM | POA: Diagnosis not present

## 2022-10-19 DIAGNOSIS — R0602 Shortness of breath: Secondary | ICD-10-CM | POA: Diagnosis not present

## 2022-10-19 LAB — CBC WITH DIFFERENTIAL/PLATELET
Abs Immature Granulocytes: 0.06 10*3/uL (ref 0.00–0.07)
Basophils Absolute: 0.1 10*3/uL (ref 0.0–0.1)
Basophils Relative: 1 %
Eosinophils Absolute: 0.2 10*3/uL (ref 0.0–0.5)
Eosinophils Relative: 2 %
HCT: 42.3 % (ref 39.0–52.0)
Hemoglobin: 14.5 g/dL (ref 13.0–17.0)
Immature Granulocytes: 1 %
Lymphocytes Relative: 22 %
Lymphs Abs: 2.3 10*3/uL (ref 0.7–4.0)
MCH: 29.8 pg (ref 26.0–34.0)
MCHC: 34.3 g/dL (ref 30.0–36.0)
MCV: 86.9 fL (ref 80.0–100.0)
Monocytes Absolute: 0.7 10*3/uL (ref 0.1–1.0)
Monocytes Relative: 7 %
Neutro Abs: 7 10*3/uL (ref 1.7–7.7)
Neutrophils Relative %: 67 %
Platelets: 309 10*3/uL (ref 150–400)
RBC: 4.87 MIL/uL (ref 4.22–5.81)
RDW: 12.9 % (ref 11.5–15.5)
WBC: 10.4 10*3/uL (ref 4.0–10.5)
nRBC: 0 % (ref 0.0–0.2)

## 2022-10-19 LAB — PROTIME-INR
INR: 1.1 (ref 0.8–1.2)
Prothrombin Time: 14.3 seconds (ref 11.4–15.2)

## 2022-10-19 LAB — COMPREHENSIVE METABOLIC PANEL
ALT: 23 U/L (ref 0–44)
AST: 24 U/L (ref 15–41)
Albumin: 4.2 g/dL (ref 3.5–5.0)
Alkaline Phosphatase: 55 U/L (ref 38–126)
Anion gap: 12 (ref 5–15)
BUN: 12 mg/dL (ref 6–20)
CO2: 22 mmol/L (ref 22–32)
Calcium: 9 mg/dL (ref 8.9–10.3)
Chloride: 102 mmol/L (ref 98–111)
Creatinine, Ser: 1 mg/dL (ref 0.61–1.24)
GFR, Estimated: >60 mL/min (ref >60)
Glucose, Bld: 124 mg/dL — ABNORMAL HIGH (ref 70–99)
Potassium: 3.7 mmol/L (ref 3.5–5.1)
Sodium: 136 mmol/L (ref 135–145)
Total Bilirubin: 0.7 mg/dL (ref 0.3–1.2)
Total Protein: 7.2 g/dL (ref 6.5–8.1)

## 2022-10-19 LAB — BRAIN NATRIURETIC PEPTIDE: B Natriuretic Peptide: 16.6 pg/mL (ref 0.0–100.0)

## 2022-10-19 LAB — TROPONIN I (HIGH SENSITIVITY): Troponin I (High Sensitivity): 8 ng/L (ref <18)

## 2022-10-19 NOTE — ED Provider Notes (Signed)
[place] EMERGENCY DEPARTMENT AT [facility] Provider Note  CSN: [id number] Arrival date & time: 10/19/22 2216  Chief Complaint(s) Shortness of Breath and Hypertension  HPI Michael Hart is a 37 y.o. male with a past medical history listed below including asthma, anxiety, CAD status post STEMI requiring stenting and double bypass in September  2013 presents to the emergency department for recurrent palpitations.  This has been ongoing for approximately a month.  He finds it difficult to describe the sensation felt with these episodes but when asked if they feel like a sinking feeling, he states that that is exactly how it feels.  He reports that these episodes last anywhere from few seconds to few minutes.  He has associated lightheadedness with the episode.  Denies any chest pain.  At times he does feel short of breath.  He denies any lower extremity edema.  No  dyspnea on exertion or  exertional angina.  He denies recent fevers or infections.  No coughing or congestion.  He did report that he has been feeling these episodes more recently over the past several days and states that he had a long weekend where he played several rounds of golf and drink some alcohol.  The history is provided by the patient.    Past Medical History Past Medical History:  Diagnosis Date   Allergy    Asthma    Coronary artery disease    Gout    Kidney stone    Panic attacks    nocturnal, controls with self directed biofeedback   Primary hypertension 03/03/2022   Patient Active Problem List   Diagnosis Date Noted   Palpitations 09/23/2022   Visit for wound check 04/20/2022   Witnessed episode of apnea 04/16/2022   At risk for sleep apnea 04/16/2022   Thrombocytosis 04/16/2022   S/P CABG x 2    HFrEF (heart failure with reduced ejection fraction) (HCC)    Coronary artery disease 03/27/2022   STEMI (ST elevation myocardial infarction) (HCC) 03/21/2022   Primary hypertension 03/03/2022    Hypertriglyceridemia 03/03/2022   Acute right ankle pain 03/03/2022   Family history of early CAD 01/28/2022   Chronic gout without tophus 01/28/2022   Elevated blood pressure reading in office without diagnosis of hypertension 01/28/2022   Obesity (BMI 30-39.9) 01/28/2022   Migraine with aura and without status migrainosus, not intractable 11/24/2021   Hyperglycemia 04/19/2019   Bereavement 04/10/2019   Carpal tunnel syndrome of left wrist 11/30/2018   Anxiety 04/08/2018   Chronic or recurrent subluxation of peroneal tendon of right foot 07/29/2017   Hematuria 04/05/2017   History of gout 10/22/2015   Low testosterone 10/22/2015   Mixed hyperlipidemia 12/05/2014   Obese    Home Medication(s) Prior to Admission medications   Medication Sig Start Date End Date Taking? Authorizing Provider  acetaminophen (TYLENOL) 500 MG tablet Take 1-2 tablets (500-1,000 mg total) by mouth every 6 (six) hours. 03/31/22   Barrett, Erin R, PA-C  allopurinol (ZYLOPRIM) 100 MG tablet Take 2 tablets (200 mg total) by mouth daily. 07/31/22   Henson, Vickie L, NP-C  aspirin EC 81 MG tablet Take 1 tablet (81 mg total) by mouth daily. Swallow whole. 03/31/22   Barrett, Erin R, PA-C  cetirizine (ZYRTEC) 10 MG tablet Take 1 tablet (10 mg total) by mouth daily as needed for allergies. 07/31/22   Henson, Vickie L, NP-C  colchicine 0.6 MG tablet Take 1 tablet (0.6 mg total) by mouth 2 (two) times daily.  Take at onset of foot or ankle pain. 06/30/22   Henson, Vickie L, NP-C  metoprolol tartrate (LOPRESSOR) 50 MG tablet Take 1 tablet (50 mg total) by mouth 2 (two) times daily. 08/02/22   Custovic, Rozell Searing, DO  rosuvastatin (CRESTOR) 20 MG tablet Take 1 tablet (20 mg total) by mouth daily. 09/28/22   Henson, Vickie L, NP-C  ticagrelor (BRILINTA) 90 MG TABS tablet Take 1 tablet (90 mg total) by mouth 2 (two) times daily. 09/23/22   Patwardhan, Anabel Bene, MD  triamcinolone cream (KENALOG) 0.1 % Apply topically 2 times daily. 08/31/22    Viviano Simas, FNP                                                                                                                                    Allergies Penicillins and Sulfa antibiotics  Review of Systems Review of Systems As noted in HPI  Physical Exam Vital Signs  I have reviewed the triage vital signs BP (!) 142/97   Pulse 80   Resp (!) 22   SpO2 100%   Physical Exam Vitals reviewed.  Constitutional:      General: He is not in acute distress.    Appearance: He is well-developed. He is not diaphoretic.  HENT:     Head: Normocephalic and atraumatic.     Nose: Nose normal.  Eyes:     General: No scleral icterus.       Right eye: No discharge.        Left eye: No discharge.     Conjunctiva/sclera: Conjunctivae normal.     Pupils: Pupils are equal, round, and reactive to light.  Cardiovascular:     Rate and Rhythm: Normal rate. Rhythm regularly irregular.     Heart sounds: No murmur heard.    No friction rub. No gallop.  Pulmonary:     Effort: Pulmonary effort is normal. No respiratory distress.     Breath sounds: Normal breath sounds. No stridor. No rales.  Abdominal:     General: There is no distension.     Palpations: Abdomen is soft.     Tenderness: There is no abdominal tenderness.  Musculoskeletal:        General: No tenderness.     Cervical back: Normal range of motion and neck supple.     Right lower leg: 1+ Pitting Edema present.     Left lower leg: 1+ Pitting Edema present.  Skin:    General: Skin is warm and dry.     Findings: No erythema or rash.  Neurological:     Mental Status: He is alert and oriented to person, place, and time.     ED Results and Treatments Labs (all labs ordered are listed, but only abnormal results are displayed) Labs Reviewed  COMPREHENSIVE METABOLIC PANEL - Abnormal; Notable for the following components:      Result Value   Glucose, Bld 124 (*)  All other components within normal limits  CBC WITH  DIFFERENTIAL/PLATELET  PROTIME-INR  BRAIN NATRIURETIC PEPTIDE  TROPONIN I (HIGH SENSITIVITY)                                                                                                                         EKG  EKG Interpretation  Date/Time:  Monday October 19 2022 22:23:00 EDT Ventricular Rate:  87 PR Interval:  162 QRS Duration: 82 QT Interval:  356 QTC Calculation: 428 R Axis:   97 Text Interpretation: Normal sinus rhythm Rightward axis Septal infarct , age undetermined Abnormal ECG When compared with ECG of 28-Mar-2022 07:06, PREVIOUS ECG IS PRESENT No significant change was found \ Confirmed by Drema Pry [phone]) on 10/19/2022 11:13:23 PM       Radiology DG Chest 2 View  Result Date: 10/19/2022 CLINICAL DATA:  Shortness of breath. EXAM: CHEST - 2 VIEW COMPARISON:  April 28, 2022 FINDINGS: Multiple sternal wires and vascular clips are seen. The heart size and mediastinal contours are within normal limits. A coronary artery stent is noted. Both lungs are clear. The visualized skeletal structures are unremarkable. IMPRESSION: 1. Evidence of prior median sternotomy/CABG. 2. No acute or active cardiopulmonary disease. Electronically Signed   By: Aram Candela M.D.   On: 10/19/2022 22:45    Medications Ordered in ED Medications - No data to display                                                                                                                                   Procedures Procedures  (including critical care time)  Medical Decision Making / ED Course  Click here for ABCD2, HEART and other calculators  Medical Decision Making Amount and/or Complexity of Data Reviewed Labs: ordered. Radiology: ordered.    This patient presents to the ED for concern of Palpitations, this involves an extensive number of treatment options, and is a complaint that carries with it a high risk of complications and morbidity. The differential diagnosis includes but not  limited to:  PVCs/PACs, other dysrhythmias or blocks.  Patient has mild peripheral edema and previous echo showed EF of 47%.  Will assess for evidence of CHF.  No chest pain concerning for ACS but will obtain cardiac workup.  Will also assess for electrolyte/metabolic derangements or dehydration.   Initial intervention:  ***  Work up Interpretation and Management:  Cardiac Monitoring/EKG: Tele:  infrequent PVCs and PACs. This correlates with patient's symptoms while interviewing him. EKG without acute ischemic changes or evidence of pericarditis.  No dysrhythmias or blocks.  No interval changes.  Laboratory Tests ordered listed below with my independent interpretation: ***   Imaging Studies ordered listed below with my independent interpretation: ***    Reassessment: ***        Final Clinical Impression(s) / ED Diagnoses Final diagnoses:  None    {Document critical care time when appropriate:1}  {Document review of labs and clinical decision tools ie heart score, Chads2Vasc2 etc:1}  {Document your independent review of radiology images, and any outside records:1} {Document your discussion with family members, caretakers, and with consultants:1} {Document social determinants of health affecting pt's care:1} {Document your decision making why or why not admission, treatments were needed:1} This chart was dictated using voice recognition software.  Despite best efforts to proofread,  errors can occur which can change the documentation meaning.

## 2022-10-19 NOTE — ED Triage Notes (Signed)
Patient reports SOB for several days and elevated blood pressure this evening BP= 160/90 , history of CABG/Coronary Stents , denies chest pain .

## 2022-10-20 LAB — TROPONIN I (HIGH SENSITIVITY): Troponin I (High Sensitivity): 9 ng/L (ref <18)

## 2022-10-21 ENCOUNTER — Telehealth: Payer: Self-pay

## 2022-10-21 NOTE — Transitions of Care (Post Inpatient/ED Visit) (Signed)
   10/21/2022  Name: Michael Hart MRN: [id number] DOB: September 05, 1985  Today's TOC FU Call Status: Today's TOC FU Call Status:: Unsuccessul Call (1st Attempt) Unsuccessful Call (1st Attempt) Date: 10/21/22  Attempted to reach the patient regarding the most recent Inpatient/ED visit.  Follow Up Plan: Additional outreach attempts will be made to reach the patient to complete the Transitions of Care (Post Inpatient/ED visit) call.   Signature Karena Addison, LPN [facility] Nurse Health Advisor Direct Dial [phone]

## 2022-10-22 NOTE — Transitions of Care (Post Inpatient/ED Visit) (Signed)
   10/22/2022  Name: Michael Hart MRN: [id number] DOB: 12/04/1985  Today's TOC FU Call Status: Today's TOC FU Call Status:: Unsuccessful Call (2nd Attempt) Unsuccessful Call (1st Attempt) Date: 10/21/22 Unsuccessful Call (2nd Attempt) Date: 10/22/22  Attempted to reach the patient regarding the most recent Inpatient/ED visit.  Follow Up Plan: Additional outreach attempts will be made to reach the patient to complete the Transitions of Care (Post Inpatient/ED visit) call.   Signature Karena Addison, LPN [facility] Nurse Health Advisor Direct Dial [phone]

## 2022-10-23 NOTE — Transitions of Care (Post Inpatient/ED Visit) (Signed)
   10/23/2022  Name: Michael Hart MRN: [id number] DOB: 09-21-1985  Today's TOC FU Call Status: Today's TOC FU Call Status:: Successful TOC FU Call Competed Unsuccessful Call (1st Attempt) Date: 10/21/22 Unsuccessful Call (2nd Attempt) Date: 10/22/22 [facility] FU Call Complete Date: 10/23/22  Transition Care Management Follow-up Telephone Call Date of Discharge: 10/20/22 Discharge Facility: [facility]) Type of Discharge: Emergency Department Reason for ED Visit: Other: (palpitations) How have you been since you were released from the hospital?: Better Any questions or concerns?: No  Items Reviewed: Did you receive and understand the discharge instructions provided?: Yes Medications obtained,verified, and reconciled?: Yes (Medications Reviewed) Any new allergies since your discharge?: No Dietary orders reviewed?: Yes Do you have support at home?: No  Medications Reviewed Today: Medications Reviewed Today     Reviewed by Karena Addison, LPN (Licensed Practical Nurse) on 10/23/22 at 1502  Med List Status: <None>   Medication Order Taking? Sig Documenting Provider Last Dose Status Informant  acetaminophen (TYLENOL) 500 MG tablet [id number] Yes Take 1-2 tablets (500-1,000 mg total) by mouth every 6 (six) hours. Barrett, Rae Roam, PA-C Taking Active Self  allopurinol (ZYLOPRIM) 100 MG tablet [id number] Yes Take 2 tablets (200 mg total) by mouth daily. Hetty Blend L, NP-C Taking Active   aspirin EC 81 MG tablet [id number] Yes Take 1 tablet (81 mg total) by mouth daily. Swallow whole. Barrett, Rae Roam, PA-C Taking Active Self  cetirizine (ZYRTEC) 10 MG tablet [id number] Yes Take 1 tablet (10 mg total) by mouth daily as needed for allergies. Henson, Vickie L, NP-C Taking Active   colchicine 0.6 MG tablet [id number] Yes Take 1 tablet (0.6 mg total) by mouth 2 (two) times daily. Take at onset of foot or ankle pain. Henson, Vickie L, NP-C Taking Active   metoprolol tartrate (LOPRESSOR) 50 MG  tablet [id number] Yes Take 1 tablet (50 mg total) by mouth 2 (two) times daily. Custovic, Sabina, DO Taking Active   rosuvastatin (CRESTOR) 20 MG tablet [id number] Yes Take 1 tablet (20 mg total) by mouth daily. Henson, Vickie L, NP-C Taking Active   ticagrelor (BRILINTA) 90 MG TABS tablet [id number] Yes Take 1 tablet (90 mg total) by mouth 2 (two) times daily. Patwardhan, Anabel Bene, MD Taking Active   triamcinolone cream (KENALOG) 0.1 % [id number] Yes Apply topically 2 times daily. Viviano Simas, FNP Taking Active             Home Care and Equipment/Supplies: Were Home Health Services Ordered?: NA Any new equipment or medical supplies ordered?: NA  Functional Questionnaire: Do you need assistance with bathing/showering or dressing?: No Do you need assistance with meal preparation?: No Do you need assistance with eating?: No Do you have difficulty maintaining continence: No Do you need assistance with getting out of bed/getting out of a chair/moving?: No Do you have difficulty managing or taking your medications?: No  Follow up appointments reviewed: PCP Follow-up appointment confirmed?: NA Specialist Hospital Follow-up appointment confirmed?: Yes Date of Specialist follow-up appointment?: 10/26/22 Follow-Up Specialty Provider:: cardio Do you need transportation to your follow-up appointment?: No Do you understand care options if your condition(s) worsen?: Yes-patient verbalized understanding    SIGNATURE Karena Addison, LPN [facility] Nurse Health Advisor Direct Dial [phone]

## 2022-10-26 ENCOUNTER — Ambulatory Visit: Payer: BC Managed Care – PPO

## 2022-10-26 DIAGNOSIS — I2510 Atherosclerotic heart disease of native coronary artery without angina pectoris: Secondary | ICD-10-CM

## 2022-10-27 ENCOUNTER — Other Ambulatory Visit: Payer: Self-pay

## 2022-10-27 ENCOUNTER — Encounter: Payer: Self-pay | Admitting: Pharmacist

## 2022-10-27 ENCOUNTER — Other Ambulatory Visit: Payer: Self-pay | Admitting: Family Medicine

## 2022-10-27 ENCOUNTER — Other Ambulatory Visit (HOSPITAL_COMMUNITY): Payer: Self-pay

## 2022-10-27 DIAGNOSIS — R002 Palpitations: Secondary | ICD-10-CM | POA: Diagnosis not present

## 2022-10-27 DIAGNOSIS — M1A9XX0 Chronic gout, unspecified, without tophus (tophi): Secondary | ICD-10-CM

## 2022-10-27 MED ORDER — ALLOPURINOL 100 MG PO TABS
200.0000 mg | ORAL_TABLET | Freq: Every day | ORAL | 2 refills | Status: DC
Start: 2022-10-27 — End: 2023-02-19
  Filled 2022-10-27: qty 60, 30d supply, fill #0
  Filled 2022-11-28: qty 60, 30d supply, fill #1
  Filled 2022-12-30: qty 60, 30d supply, fill #2

## 2022-10-27 MED ORDER — COLCHICINE 0.6 MG PO TABS
0.6000 mg | ORAL_TABLET | Freq: Two times a day (BID) | ORAL | 0 refills | Status: AC
Start: 2022-10-27 — End: ?
  Filled 2022-10-27: qty 30, 15d supply, fill #0

## 2022-10-27 NOTE — Progress Notes (Signed)
Occasional extra beats noted on monitor, that correlated with symptoms.  No severe arrhythmia noted. I see that patient was in the ER recently. Happy to see sooner for follow up, currently scheduled for 03/2023.  Thanks MJP

## 2022-11-28 ENCOUNTER — Other Ambulatory Visit (HOSPITAL_COMMUNITY): Payer: Self-pay

## 2022-11-28 ENCOUNTER — Other Ambulatory Visit: Payer: Self-pay | Admitting: Internal Medicine

## 2022-12-02 ENCOUNTER — Other Ambulatory Visit (HOSPITAL_COMMUNITY): Payer: Self-pay

## 2022-12-02 MED ORDER — METOPROLOL TARTRATE 50 MG PO TABS
50.0000 mg | ORAL_TABLET | Freq: Two times a day (BID) | ORAL | 3 refills | Status: DC
  Filled 2022-12-02: qty 60, 30d supply, fill #0
  Filled 2022-12-30: qty 60, 30d supply, fill #1
  Filled 2023-01-16: qty 60, 30d supply, fill #2
  Filled 2023-02-19: qty 60, 30d supply, fill #3

## 2022-12-04 ENCOUNTER — Other Ambulatory Visit (HOSPITAL_COMMUNITY): Payer: Self-pay

## 2022-12-11 ENCOUNTER — Encounter: Payer: Self-pay | Admitting: Cardiology

## 2022-12-14 ENCOUNTER — Telehealth: Payer: Self-pay

## 2022-12-14 ENCOUNTER — Other Ambulatory Visit (HOSPITAL_COMMUNITY): Payer: Self-pay

## 2022-12-14 DIAGNOSIS — I2510 Atherosclerotic heart disease of native coronary artery without angina pectoris: Secondary | ICD-10-CM

## 2022-12-14 DIAGNOSIS — R002 Palpitations: Secondary | ICD-10-CM

## 2022-12-14 MED ORDER — CLOPIDOGREL BISULFATE 75 MG PO TABS
75.0000 mg | ORAL_TABLET | Freq: Every day | ORAL | 1 refills | Status: AC
Start: 2022-12-14 — End: ?
  Filled 2022-12-14: qty 90, 90d supply, fill #0
  Filled 2023-01-16 – 2023-02-19 (×2): qty 90, 90d supply, fill #1

## 2022-12-14 NOTE — Telephone Encounter (Signed)
Pt aware of change and new med sent to pharmacy. Will cb if no improvement//ah

## 2022-12-14 NOTE — Telephone Encounter (Signed)
From pt

## 2022-12-14 NOTE — Telephone Encounter (Signed)
ICD-10-CM   1. Coronary artery disease involving native coronary artery of native heart without angina pectoris  I25.10 clopidogrel (PLAVIX) 75 MG tablet    2. Palpitations  R00.2      Meds ordered this encounter  Medications   clopidogrel (PLAVIX) 75 MG tablet    Sig: Take 1 tablet (75 mg total) by mouth daily.    Dispense:  90 tablet    Refill:  1    Discontinue Brilinta   Medications Discontinued During This Encounter  Medication Reason   ticagrelor (BRILINTA) 90 MG TABS tablet Change in therapy     Cannot feels Brilinta could be contributing to this palpitations, I do not have contraindication for discontinuing Brilinta and switching to Plavix and see how he does.  If symptoms persist, we will also add verapamil 120 mg daily.

## 2022-12-14 NOTE — Telephone Encounter (Signed)
Pt of [facility] called stating he is having similar symptoms to when he was seen at the ED. Played tennis on wed and started having palps, sob. Cleared up and began again on Friday and has been on/off worsening since then. No caffeine since Friday. HR 69. Wants to know if needs to be seen or advise.

## 2022-12-14 NOTE — Telephone Encounter (Signed)
Palps/PVC's

## 2022-12-28 ENCOUNTER — Encounter: Payer: Self-pay | Admitting: Cardiology

## 2022-12-28 ENCOUNTER — Other Ambulatory Visit (HOSPITAL_COMMUNITY): Payer: Self-pay

## 2022-12-28 ENCOUNTER — Ambulatory Visit: Payer: BC Managed Care – PPO | Admitting: Cardiology

## 2022-12-28 VITALS — BP 130/85 | HR 75 | Resp 16 | Ht 69.0 in | Wt 240.2 lb

## 2022-12-28 DIAGNOSIS — R002 Palpitations: Secondary | ICD-10-CM | POA: Diagnosis not present

## 2022-12-28 DIAGNOSIS — I10 Essential (primary) hypertension: Secondary | ICD-10-CM | POA: Diagnosis not present

## 2022-12-28 DIAGNOSIS — I2510 Atherosclerotic heart disease of native coronary artery without angina pectoris: Secondary | ICD-10-CM

## 2022-12-28 MED ORDER — LOSARTAN POTASSIUM 50 MG PO TABS
50.0000 mg | ORAL_TABLET | Freq: Every evening | ORAL | 2 refills | Status: DC
Start: 2022-12-28 — End: 2023-03-30
  Filled 2022-12-28: qty 30, 30d supply, fill #0
  Filled 2023-01-16: qty 30, 30d supply, fill #1
  Filled 2023-02-19: qty 30, 30d supply, fill #2

## 2022-12-28 NOTE — Progress Notes (Signed)
Follow up visit  Subjective:   Michael Hart, male    DOB: [birth year], 37 y.o.   MRN: [id number]    HPI  Chief Complaint  Patient presents with   PAC    37 y.o. hypertension, hyperlipidemia, with a vessel CAD with primary PCI to the LAD for STEMI followed by CABG x 2 in September 2023, mild OSA by sleep study on 05/12/2022, not on CPAP and moderate obesity, patient made an appointment to see [place] for palpitations.  Patient made an appointment to see [place] due to persistent palpitations.  Describes them as mostly occurring during rest.  States that he has been exercising regularly and has not noticed them during exercise but occasionally after he is done with exercise when he is relaxing he starts noticing them again.  Pretty much he is having symptoms fairly regularly almost on a daily basis.  No dizziness or syncope.  He has not had any chest pain and he has not used any sublingual nitroglycerin.  Current Outpatient Medications:    allopurinol (ZYLOPRIM) 100 MG tablet, Take 2 tablets (200 mg total) by mouth daily., Disp: 60 tablet, Rfl: 2   aspirin EC 81 MG tablet, Take 1 tablet (81 mg total) by mouth daily. Swallow whole., Disp: 30 tablet, Rfl: 12   cetirizine (ZYRTEC) 10 MG tablet, Take 1 tablet (10 mg total) by mouth daily as needed for allergies., Disp: 90 tablet, Rfl: 1   clopidogrel (PLAVIX) 75 MG tablet, Take 1 tablet (75 mg total) by mouth daily., Disp: 90 tablet, Rfl: 1   colchicine 0.6 MG tablet, Take 1 tablet (0.6 mg total) by mouth 2 (two) times daily. Take at onset of foot or ankle pain., Disp: 30 tablet, Rfl: 0   losartan (COZAAR) 50 MG tablet, Take 1 tablet (50 mg total) by mouth every evening., Disp: 30 tablet, Rfl: 2   metoprolol tartrate (LOPRESSOR) 50 MG tablet, Take 1 tablet (50 mg) by mouth 2 times daily., Disp: 60 tablet, Rfl: 3   rosuvastatin (CRESTOR) 20 MG tablet, Take 1 tablet (20 mg total) by mouth daily., Disp: 90 tablet, Rfl: 0   Cardiovascular & other  pertient studies:  Reviewed external labs and tests, independently interpreted  EKG 10/20/2022: Sinus rhythm 66 bpm Old anterior infarct  Op note 03/27/2022 (Dr. Dorris Fetch): LIMA-RI, free RIMA-dRCA  Coronary intervention 03/21/2022: LM: Normal LAD: Prox 100% occlusion          Mid 80-90% stenoses          Diffuse 80% diag disease Ramus: Large vessel. Prox 75% stenosis Lcx: Mid 95%, OM1 95% bifurcation stenosis RCA: Mid 80%, followed by 75% stenoses          Distal 80% stenosis   Successful percutaneous coronary intervention prox-mid LAD     Aspiration thrombectomy, PTCA, and overlapping stents placement     Synergy DES 4.0X12 mm, 3.5X48 mm, 3.0X16 mm     This was necessitated by flow limiting lesions beyond the culprit lesion     Post dilataation with 4.0X15 mm [place] balloon upto 18 atm      We will consider non-culprit vessel revascularization to remaining vessels during index hospitalization.        PCV ECHOCARDIOGRAM COMPLETE 10/26/2022  Narrative Echocardiogram 10/26/2022: Normal LV systolic function with visual EF 55-60%. Left ventricle cavity is normal in size. Normal left ventricular wall thickness. Normal global wall motion. Normal diastolic filling pattern, normal LAP. Calculated EF 55%. Left atrial cavity is slightly dilated. Structurally normal  tricuspid valve with trace regurgitation. No evidence of pulmonary hypertension. Compared to hospital echocardiogram 03/21/2022, LVEF has improved from 40 to 45% to the present 55 to 60%.  Anterior hypokinesis to akinesis not present.  Sleep study 05/12/2022: - Mild obstructive sleep apnea occurred during this study (AHI = 12.9/h). - The patient had minimal or no oxygen desaturation during the study (Min O2 = 91%) - Patient snored.  Recent labs: 04/14/2022: Glucose 93, BUN/Cr 14/0.98. EGFR 97. Na/K 137/4.2. Rest of the CMP normal H/H 12/37. MCV 85. Platelets 497  03/21/2022: HbA1C 5.2% Chol 115, TG 227, HDL 25, LDL  62 Lipoprotein (a) 62 TSH 1.8 normal  Review of Systems  Cardiovascular:  Positive for palpitations. Negative for chest pain, dyspnea on exertion, leg swelling and syncope.   Vitals:   12/28/22 1611  BP: 130/85  Pulse: 75  Resp: 16  SpO2: 98%    Body mass index is 35.47 kg/m. Filed Weights   12/28/22 1611  Weight: 240 lb 3.2 oz (109 kg)   Objective:   Physical Exam Vitals and nursing note reviewed.  Constitutional:      General: He is not in acute distress. Neck:     Vascular: No JVD.  Cardiovascular:     Rate and Rhythm: Normal rate and regular rhythm.     Heart sounds: Normal heart sounds. No murmur heard. Pulmonary:     Effort: Pulmonary effort is normal.     Breath sounds: Normal breath sounds. No wheezing or rales.  Musculoskeletal:     Right lower leg: No edema.     Left lower leg: No edema.    Visit diagnoses:   ICD-10-CM   1. Palpitations  R00.2     2. Coronary artery disease involving native coronary artery of native heart without angina pectoris  I25.10 losartan (COZAAR) 50 MG tablet    3. Primary hypertension  I10 losartan (COZAAR) 50 MG tablet    Basic metabolic panel    Magnesium    4. Class 2 severe obesity due to excess calories with serious comorbidity and body mass index (BMI) of 35.0 to 35.9 in adult [facility])  E66.01    Z68.35       Orders Placed This Encounter  Procedures   Basic metabolic panel   Magnesium   Medication changes this visit: Medications Discontinued During This Encounter  Medication Reason   acetaminophen (TYLENOL) 500 MG tablet    triamcinolone cream (KENALOG) 0.1 %     Meds ordered this encounter  Medications   losartan (COZAAR) 50 MG tablet    Sig: Take 1 tablet (50 mg total) by mouth every evening.    Dispense:  30 tablet    Refill:  2     Assessment & Recommendations:   37 y.o. Caucasian male with hypertension, hyperlipidemia, with a vessel CAD with primary PCI to the LAD for STEMI followed by CABG x 2 in  September 2023, mild OSA by sleep study on 05/12/2022, not on CPAP and moderate obesity, patient made an appointment to see [place] for palpitations.  1. Palpitations Patient symptoms of palpitations mostly occurring at rest and not with exertion activity suggest PACs and PVCs.  No other associated dizziness or syncope.  Patient only has this when he is just laxed and is sitting down but not with exertion activity or with physical exertion or exercise which she does on a daily basis.  I reassured him with this, advised him that if symptoms persist, we can change metoprolol  to a different agent, probably consider acebutolol.  I also reassured him that his LVEF is improved by recent echocardiogram, previously noted anterior hypokinesis is now resolved and his EF is normal.  Patient has mild OSA, if symptoms of palpitation persist, could consider therapy with CPAP.  Weight loss again extensively discussed with the patient.  2. Coronary artery disease involving native coronary artery of native heart without angina pectoris In view of underlying coronary artery disease and hypertension, I have added losartan 50 mg in the evening, will obtain BMP in 2 weeks.  Hopefully addition of losartan and better control of hypertension will also decrease his PACs and PVCs.  - losartan (COZAAR) 50 MG tablet; Take 1 tablet (50 mg total) by mouth every evening.  Dispense: 30 tablet; Refill: 2  3. Primary hypertension Blood pressure 130/85 mmHg today, would like to achieve blood pressure of <130/80 mmHg or less.  I have started him on losartan, will obtain BMP along with magnesium levels in 2 to 3 weeks after starting losartan.  He will keep the follow-up appointment in 3 months with Dr. Rosemary Holms.  - losartan (COZAAR) 50 MG tablet; Take 1 tablet (50 mg total) by mouth every evening.  Dispense: 30 tablet; Refill: 2 - Basic metabolic panel - Magnesium  4. Class 2 severe obesity due to excess calories with serious  comorbidity and body mass index (BMI) of 35.0 to 35.9 in adult [facility])  Patient is interested in participating in clinical trial.  Information regarding the trial was given to the patient today.  [employer] trial: To study effect of Retatrutide (GLP-1 agonist) weekly subcu injection on MACE and declining renal function in patients with BMI >27 and ASCVD +/- CKD, 3-year study.

## 2023-01-12 DIAGNOSIS — I10 Essential (primary) hypertension: Secondary | ICD-10-CM | POA: Diagnosis not present

## 2023-01-13 LAB — BASIC METABOLIC PANEL
BUN/Creatinine Ratio: 12 (ref 9–20)
BUN: 9 mg/dL (ref 6–20)
CO2: 22 mmol/L (ref 20–29)
Calcium: 9.2 mg/dL (ref 8.7–10.2)
Chloride: 102 mmol/L (ref 96–106)
Creatinine, Ser: 0.78 mg/dL (ref 0.76–1.27)
Glucose: 97 mg/dL (ref 70–99)
Potassium: 4.1 mmol/L (ref 3.5–5.2)
Sodium: 141 mmol/L (ref 134–144)
eGFR: 116 mL/min/{1.73_m2} (ref >59)

## 2023-01-13 LAB — MAGNESIUM: Magnesium: 2.1 mg/dL (ref 1.6–2.3)

## 2023-01-16 ENCOUNTER — Other Ambulatory Visit: Payer: Self-pay | Admitting: Family Medicine

## 2023-01-18 ENCOUNTER — Other Ambulatory Visit (HOSPITAL_COMMUNITY): Payer: Self-pay

## 2023-01-18 ENCOUNTER — Other Ambulatory Visit: Payer: Self-pay

## 2023-01-18 MED ORDER — ROSUVASTATIN CALCIUM 20 MG PO TABS
20.0000 mg | ORAL_TABLET | Freq: Every day | ORAL | 1 refills | Status: DC
  Filled 2023-01-18 – 2023-02-19 (×2): qty 90, 90d supply, fill #0
  Filled 2023-03-30 – 2023-05-31 (×3): qty 90, 90d supply, fill #1

## 2023-01-19 ENCOUNTER — Other Ambulatory Visit: Payer: Self-pay

## 2023-01-19 ENCOUNTER — Other Ambulatory Visit (HOSPITAL_COMMUNITY): Payer: Self-pay

## 2023-01-22 ENCOUNTER — Other Ambulatory Visit: Payer: Self-pay

## 2023-01-26 ENCOUNTER — Other Ambulatory Visit (HOSPITAL_COMMUNITY): Payer: Self-pay

## 2023-02-19 ENCOUNTER — Other Ambulatory Visit: Payer: Self-pay | Admitting: Family Medicine

## 2023-02-19 ENCOUNTER — Other Ambulatory Visit (HOSPITAL_COMMUNITY): Payer: Self-pay

## 2023-02-19 ENCOUNTER — Other Ambulatory Visit: Payer: Self-pay

## 2023-02-19 DIAGNOSIS — M1A9XX0 Chronic gout, unspecified, without tophus (tophi): Secondary | ICD-10-CM

## 2023-02-19 MED ORDER — ALLOPURINOL 100 MG PO TABS
200.0000 mg | ORAL_TABLET | Freq: Every day | ORAL | 2 refills | Status: AC
Start: 2023-02-19 — End: ?
  Filled 2023-02-19: qty 60, 30d supply, fill #0
  Filled 2023-03-30 – 2023-05-31 (×3): qty 60, 30d supply, fill #1
  Filled 2023-07-09: qty 60, 30d supply, fill #2

## 2023-02-20 ENCOUNTER — Other Ambulatory Visit: Payer: Self-pay

## 2023-02-26 ENCOUNTER — Other Ambulatory Visit (HOSPITAL_COMMUNITY): Payer: Self-pay

## 2023-03-17 IMAGING — CT CT HEAD W/O CM
4 series · 17 of 47 positions shown, 19 images · non-contrast
Comparison: None Available.

CLINICAL DATA: Headache



[Series 2: head bone · axial · 0.46mm/px · z∈[+849,+907]mm · 4 of 85 slices shown]
[im 9/85  bone]
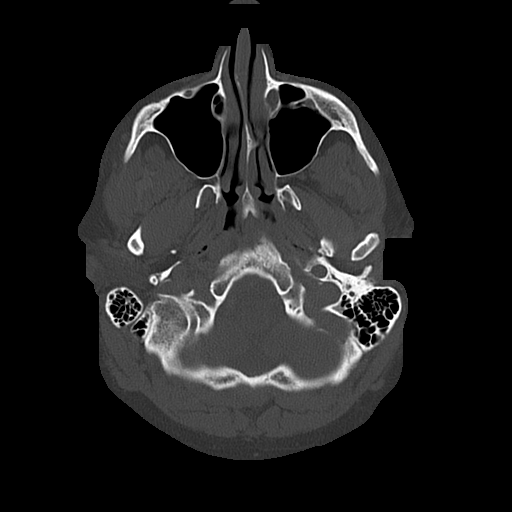
[im 17/85  bone]
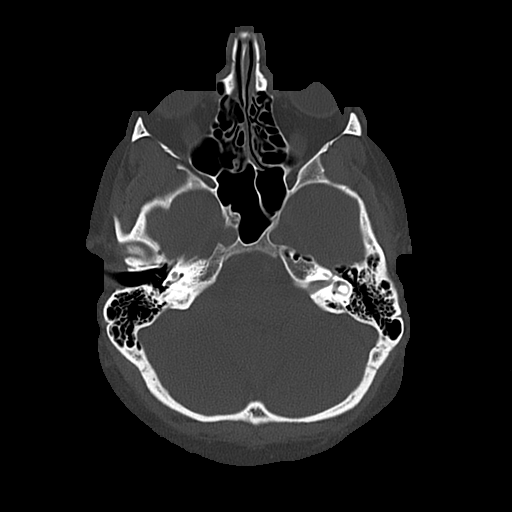
[im 26/85  bone]
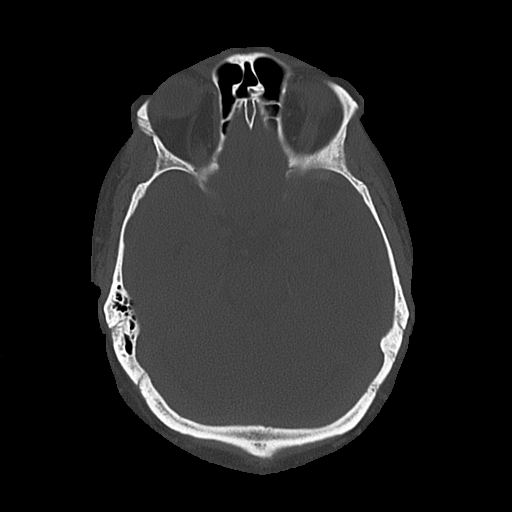
[im 38/85  bone]
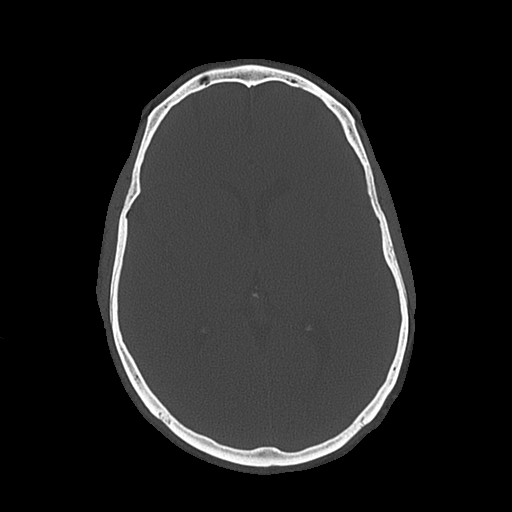

[Series 3: head wo · axial · 0.46mm/px · z∈[+853,+973]mm · 7 of 34 slices shown, 9 images]
[im 5/34  brain]
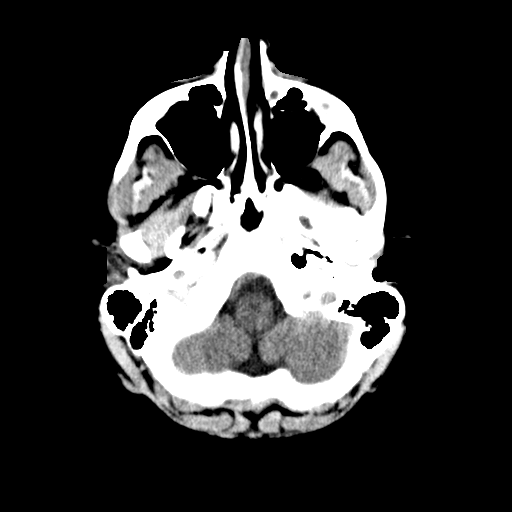
[im 5/34  bone]
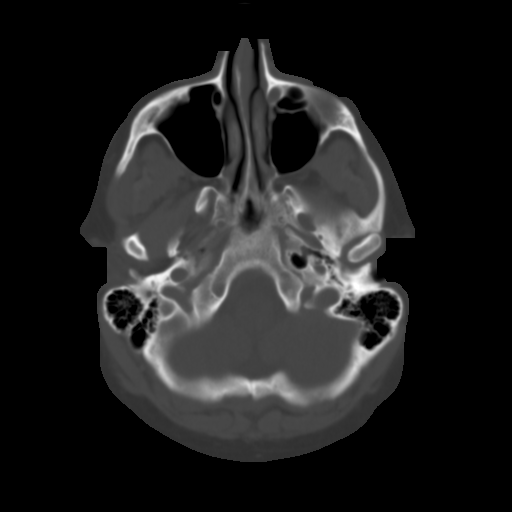
[im 9/34  brain]
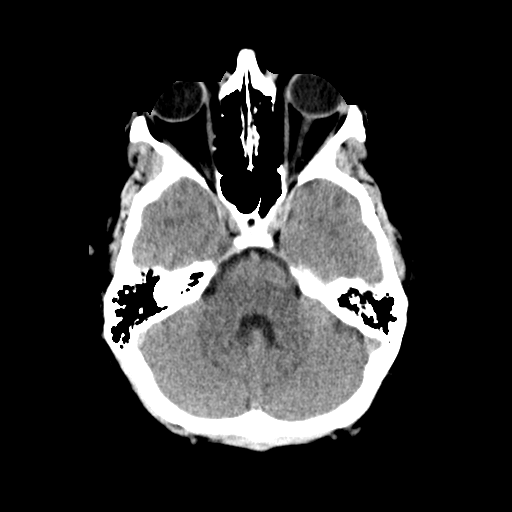
[im 13/34  brain]
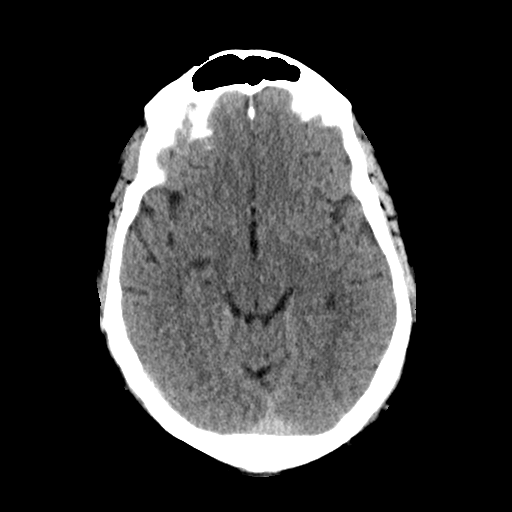
[im 17/34  brain]
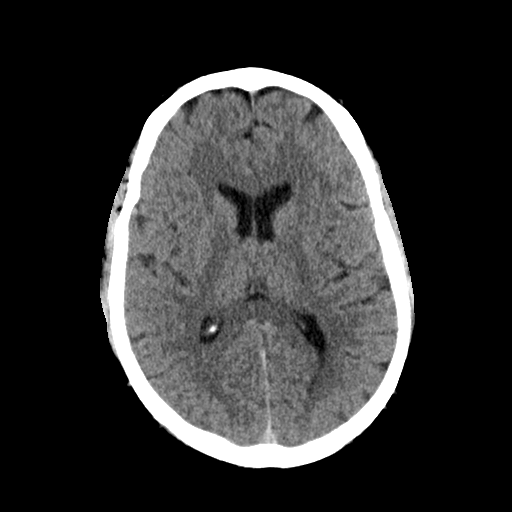
[im 21/34  brain]
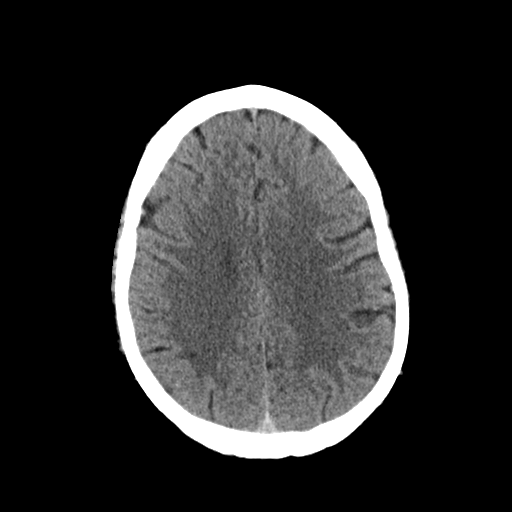
[im 21/34  bone]
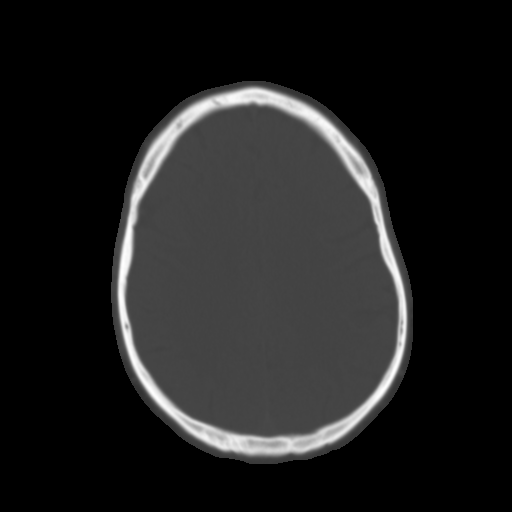
[im 25/34  brain]
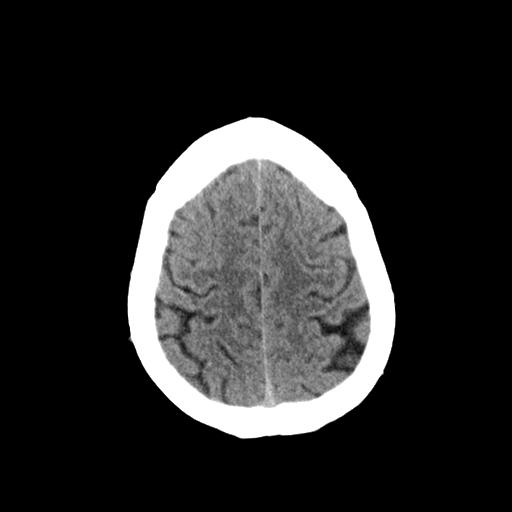
[im 29/34  brain]
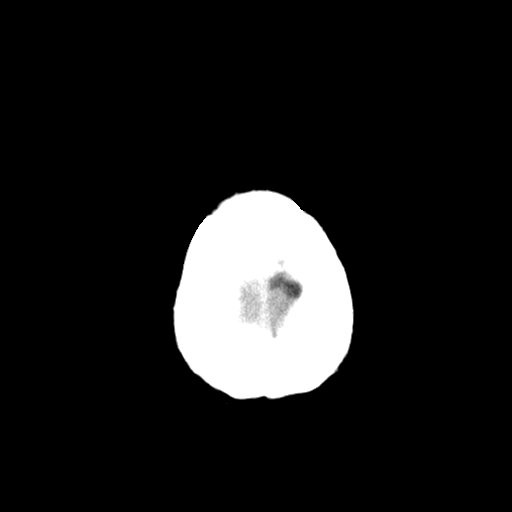

[Series 4: coronal soft · coronal · 0.31mm/px · 3 of 70 slices shown]
[im 24/70  brain]
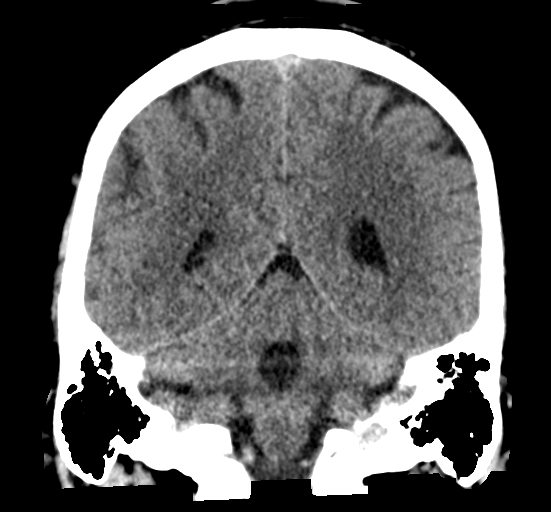
[im 31/70  brain]
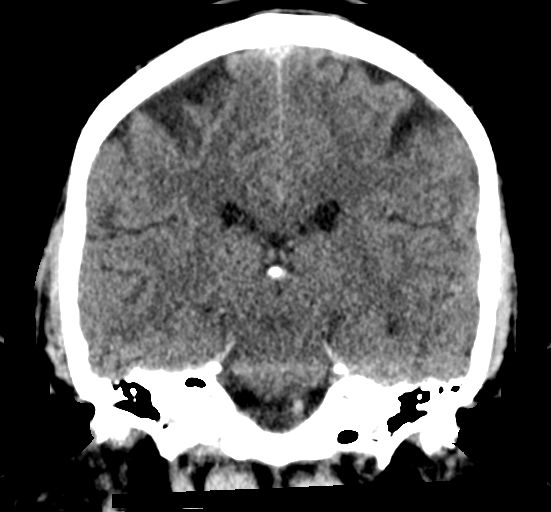
[im 39/70  brain]
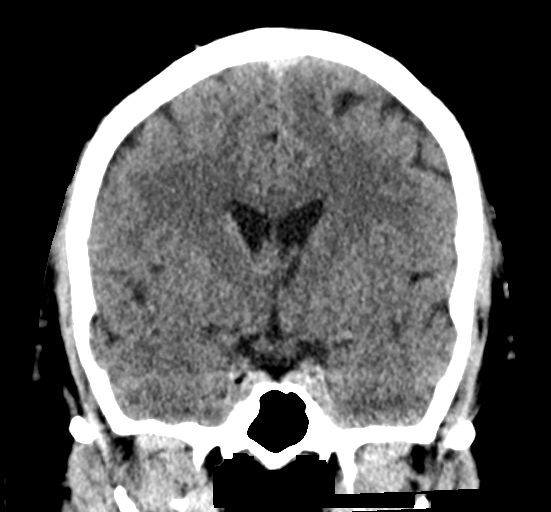

[Series 5: sagittal soft · sagittal · 0.31mm/px · 3 of 58 slices shown]
[im 20/58  brain]
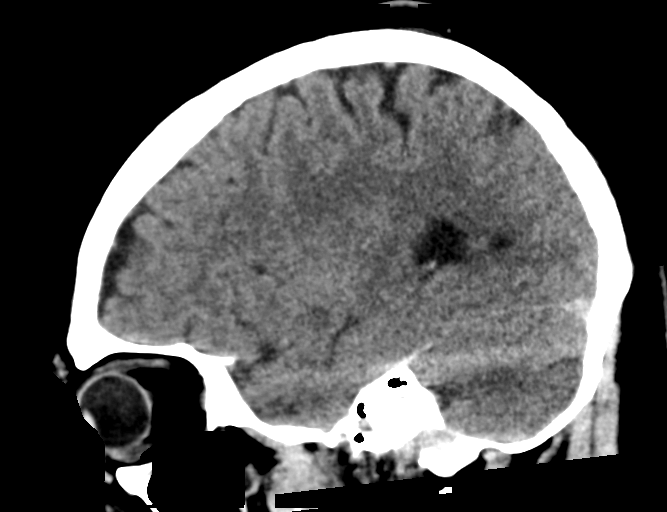
[im 29/58  brain]
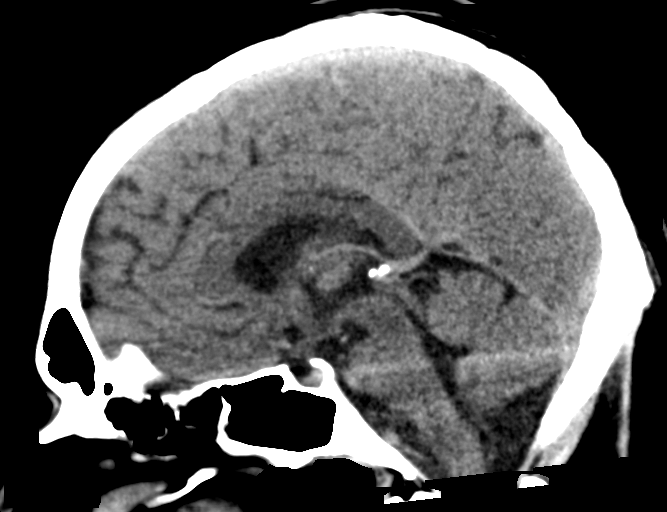
[im 39/58  brain]
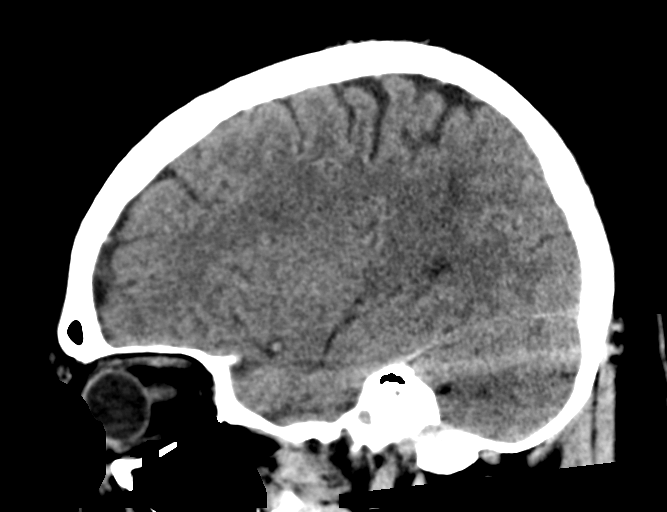

[17 of 47 positions shown; findings below may reference images not displayed]

FINDINGS: Brain: No evidence of acute infarction, hemorrhage, hydrocephalus,
extra-axial collection or mass lesion/mass effect.

Vascular: Negative for hyperdense vessel

Skull: Negative

Sinuses/Orbits: Negative

Other: None
IMPRESSION: Normal CT head

## 2023-03-25 ENCOUNTER — Ambulatory Visit: Payer: BC Managed Care – PPO | Admitting: Cardiology

## 2023-03-25 ENCOUNTER — Other Ambulatory Visit (HOSPITAL_COMMUNITY): Payer: Self-pay

## 2023-03-25 ENCOUNTER — Encounter: Payer: Self-pay | Admitting: Family Medicine

## 2023-03-25 ENCOUNTER — Ambulatory Visit: Payer: BC Managed Care – PPO | Admitting: Family Medicine

## 2023-03-25 ENCOUNTER — Encounter: Payer: Self-pay | Admitting: Cardiology

## 2023-03-25 ENCOUNTER — Ambulatory Visit: Payer: BC Managed Care – PPO | Attending: Cardiology | Admitting: Cardiology

## 2023-03-25 VITALS — BP 134/78 | HR 85 | Temp 97.6°F | Ht 69.5 in | Wt 233.0 lb

## 2023-03-25 VITALS — BP 116/74 | HR 64 | Ht 69.5 in | Wt 230.0 lb

## 2023-03-25 DIAGNOSIS — Z23 Encounter for immunization: Secondary | ICD-10-CM | POA: Diagnosis not present

## 2023-03-25 DIAGNOSIS — Z79899 Other long term (current) drug therapy: Secondary | ICD-10-CM

## 2023-03-25 DIAGNOSIS — I2510 Atherosclerotic heart disease of native coronary artery without angina pectoris: Secondary | ICD-10-CM | POA: Diagnosis not present

## 2023-03-25 DIAGNOSIS — E781 Pure hyperglyceridemia: Secondary | ICD-10-CM

## 2023-03-25 DIAGNOSIS — E669 Obesity, unspecified: Secondary | ICD-10-CM

## 2023-03-25 DIAGNOSIS — I491 Atrial premature depolarization: Secondary | ICD-10-CM | POA: Diagnosis not present

## 2023-03-25 DIAGNOSIS — M1A9XX0 Chronic gout, unspecified, without tophus (tophi): Secondary | ICD-10-CM | POA: Diagnosis not present

## 2023-03-25 DIAGNOSIS — R7303 Prediabetes: Secondary | ICD-10-CM | POA: Insufficient documentation

## 2023-03-25 DIAGNOSIS — R002 Palpitations: Secondary | ICD-10-CM

## 2023-03-25 DIAGNOSIS — I10 Essential (primary) hypertension: Secondary | ICD-10-CM

## 2023-03-25 DIAGNOSIS — E782 Mixed hyperlipidemia: Secondary | ICD-10-CM

## 2023-03-25 DIAGNOSIS — I493 Ventricular premature depolarization: Secondary | ICD-10-CM | POA: Insufficient documentation

## 2023-03-25 MED ORDER — CLOPIDOGREL BISULFATE 75 MG PO TABS
75.0000 mg | ORAL_TABLET | Freq: Every day | ORAL | 3 refills | Status: DC
Start: 2023-03-25 — End: 2023-09-06
  Filled 2023-03-25 – 2023-05-31 (×4): qty 90, 90d supply, fill #0
  Filled 2023-09-05: qty 90, 90d supply, fill #1

## 2023-03-25 NOTE — Progress Notes (Signed)
Subjective:     Patient ID: Michael Hart, male    DOB: 1985-07-06, 37 y.o.   MRN: [id number]  Chief Complaint  Patient presents with   Medical Management of Chronic Issues    6 month f/u    HPI  Discussed the use of AI scribe software for clinical note transcription with the patient, who gave verbal consent to proceed.  History of Present Illness          Here for 6 month follow up on chronic health condition.   Having PVCs. Eliminated caffeine. He saw his cardiologist earlier today.  Hx of MI No chest pain.   Gout- only taking allopurinol 100 mg daily. No gout flares.   90% plant based diet in the past 2 months.   HDL and elevated triglycerides. On statin. States cardiologist is monitoring but he would like to return for fasting lipids.     Health Maintenance Due  Topic Date Due   Hepatitis C Screening  Never done   DTaP/Tdap/Td (2 - Td or Tdap) 02/16/2023    Past Medical History:  Diagnosis Date   Allergy    Asthma    Coronary artery disease    Gout    Kidney stone    Panic attacks    nocturnal, controls with self directed biofeedback   Primary hypertension 03/03/2022   S/P CABG x 2 03/27/2022: LIMA-RI, free RIMA-dRCA     Past Surgical History:  Procedure Laterality Date   CARDIAC CATHETERIZATION     CORONARY ARTERY BYPASS GRAFT N/A 03/27/2022   Procedure: CORONARY ARTERY BYPASS GRAFTING (CABG) x2 , USING BILATERAL INTERNAL MAMMARY ARTERIES;  Surgeon: Loreli Slot, MD;  Location: [facility] OR;  Service: Open Heart Surgery;  Laterality: N/A;   CORONARY/GRAFT ACUTE MI REVASCULARIZATION N/A 03/21/2022   Procedure: Coronary/Graft Acute MI Revascularization;  Surgeon: Elder Negus, MD;  Location: [facility] INVASIVE CV LAB;  Service: Cardiovascular;  Laterality: N/A;   CYSTOSCOPY N/A 03/27/2022   Procedure: CYSTOSCOPY FLEXIBLE, PLACEMENT OF FOLEY CATHETER;  Surgeon: Jannifer Hick, MD;  Location: [facility] OR;  Service: Urology;  Laterality: N/A;   NO PAST  SURGERIES     RADIAL ARTERY HARVEST Left 03/27/2022   Procedure: ATTEMPTED RADIAL ARTERY HARVEST;  Surgeon: Loreli Slot, MD;  Location: [facility] OR;  Service: Open Heart Surgery;  Laterality: Left;   TEE WITHOUT CARDIOVERSION N/A 03/27/2022   Procedure: TRANSESOPHAGEAL ECHOCARDIOGRAM (TEE);  Surgeon: Loreli Slot, MD;  Location: [facility] OR;  Service: Open Heart Surgery;  Laterality: N/A;    Family History  Problem Relation Age of Onset   Breast cancer Mother    Lung cancer Father    Heart disease Father        CABG   COPD Father    Hypertension Other    Stroke Other    Migraines Neg Hx    Headache Neg Hx    Sleep apnea Neg Hx     Social History   Socioeconomic History   Marital status: Divorced    Spouse name: Not on file   Number of children: 2   Years of education: 16   Highest education level: Bachelor's degree (e.g., BA, AB, BS)  Occupational History   Not on file  Tobacco Use   Smoking status: Former    Current packs/day: 0.00    Average packs/day: 0.5 packs/day for 12.0 years (6.0 ttl pk-yrs)    Types: Cigarettes    Start date: 2001    Quit  date: 2013    Years since quitting: 11.7   Smokeless tobacco: Never  Vaping Use   Vaping status: Never Used  Substance and Sexual Activity   Alcohol use: Not Currently    Alcohol/week: 2.0 standard drinks of alcohol    Types: 2 Glasses of wine per week    Comment: occasional   Drug use: No   Sexual activity: Not on file  Other Topics Concern   Not on file  Social History Narrative   Not on file   Social Determinants of Health   Financial Resource Strain: Medium Risk (09/23/2022)   Overall Financial Resource Strain (CARDIA)    Difficulty of Paying Living Expenses: Somewhat hard  Food Insecurity: No Food Insecurity (09/23/2022)   Hunger Vital Sign    Worried About Running Out of Food in the Last Year: Never true    Ran Out of Food in the Last Year: Never true  Transportation Needs: No Transportation Needs  (09/23/2022)   PRAPARE - Administrator, Civil Service (Medical): No    Lack of Transportation (Non-Medical): No  Physical Activity: Insufficiently Active (09/23/2022)   Exercise Vital Sign    Days of Exercise per Week: 3 days    Minutes of Exercise per Session: 20 min  Stress: Stress Concern Present (09/23/2022)   [employer] of Occupational Health - Occupational Stress Questionnaire    Feeling of Stress : To some extent  Social Connections: Moderately Isolated (09/23/2022)   Social Connection and Isolation Panel [NHANES]    Frequency of Communication with Friends and Family: More than three times a week    Frequency of Social Gatherings with Friends and Family: Twice a week    Attends Religious Services: 1 to 4 times per year    Active Member of [employer] or Organizations: No    Attends Engineer, structural: Not on file    Marital Status: Divorced  Intimate Partner Violence: Not At Risk (03/21/2022)   Humiliation, Afraid, Rape, and Kick questionnaire    Fear of Current or Ex-Partner: No    Emotionally Abused: No    Physically Abused: No    Sexually Abused: No    Outpatient Medications Prior to Visit  Medication Sig Dispense Refill   allopurinol (ZYLOPRIM) 100 MG tablet Take 2 tablets (200 mg total) by mouth daily. 60 tablet 2   aspirin EC 81 MG tablet Take 1 tablet (81 mg total) by mouth daily. Swallow whole. 30 tablet 12   cetirizine (ZYRTEC) 10 MG tablet Take 1 tablet (10 mg total) by mouth daily as needed for allergies. 90 tablet 1   clopidogrel (PLAVIX) 75 MG tablet Take 1 tablet (75 mg total) by mouth daily. 90 tablet 3   colchicine 0.6 MG tablet Take 1 tablet (0.6 mg total) by mouth 2 (two) times daily. Take at onset of foot or ankle pain. 30 tablet 0   losartan (COZAAR) 50 MG tablet Take 1 tablet (50 mg total) by mouth every evening. 30 tablet 2   metoprolol tartrate (LOPRESSOR) 50 MG tablet Take 1 tablet (50 mg) by mouth 2 times daily. 60 tablet 3    rosuvastatin (CRESTOR) 20 MG tablet Take 1 tablet (20 mg total) by mouth daily. 90 tablet 1   No facility-administered medications prior to visit.    Allergies  Allergen Reactions   Penicillins Shortness Of Breath, Swelling and Other (See Comments)    Childhood reaction   Sulfa Antibiotics Shortness Of Breath, Swelling and Other (See Comments)  Childhood reaction    Review of Systems  Constitutional:  Negative for chills and fever.  Respiratory:  Negative for shortness of breath.   Cardiovascular:  Positive for palpitations. Negative for chest pain and leg swelling.  Gastrointestinal:  Negative for abdominal pain, constipation, diarrhea, nausea and vomiting.  Genitourinary:  Negative for dysuria, frequency and urgency.  Neurological:  Negative for dizziness.       Objective:    Physical Exam Constitutional:      General: He is not in acute distress.    Appearance: He is not ill-appearing.  Eyes:     Extraocular Movements: Extraocular movements intact.     Conjunctiva/sclera: Conjunctivae normal.  Cardiovascular:     Rate and Rhythm: Normal rate.  Pulmonary:     Effort: Pulmonary effort is normal.  Musculoskeletal:     Cervical back: Normal range of motion and neck supple.  Skin:    General: Skin is warm and dry.  Neurological:     General: No focal deficit present.     Mental Status: He is alert and oriented to person, place, and time.  Psychiatric:        Mood and Affect: Mood normal.        Behavior: Behavior normal.        Thought Content: Thought content normal.      BP 134/78 (BP Location: Left Arm, Patient Position: Sitting, Cuff Size: Large)   Pulse 85   Temp 97.6 F (36.4 C) (Temporal)   Ht 5' 9.5" (1.765 m)   Wt 233 lb (105.7 kg)   SpO2 98%   BMI 33.91 kg/m  Wt Readings from Last 3 Encounters:  03/25/23 233 lb (105.7 kg)  03/25/23 230 lb (104.3 kg)  12/28/22 240 lb 3.2 oz (109 kg)       Assessment & Plan:   Problem List Items Addressed  This Visit       Cardiovascular and Mediastinum   Coronary artery disease   Primary hypertension    Eating a healthier diet, continue weight loss and exercise as tolerated. Continue medication. 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 No flares since last visit. Has colchicine for prn. Continue low purine diet.      Hypertriglyceridemia    Continue Crestor 20 mg daily. Low fat diet and exercise. Follow up pending lipid panel.       Relevant Orders   Lipid panel   Mixed hyperlipidemia    On statin therapy. Followed by cardiologist. Will return for fasting lipids.       Relevant Orders   Lipid panel   Obesity (BMI 30-39.9)   Palpitations   Relevant Orders   TSH   Prediabetes - Primary    Recheck A1c      Relevant Orders   Hemoglobin A1c   Other Visit Diagnoses     Need for influenza vaccination       Relevant Orders   Flu vaccine trivalent PF, 6mos and older(Flulaval,Afluria,Fluarix,Fluzone) (Completed)       I am having Michael Hart. Michael "Hense" maintain his aspirin EC, cetirizine, colchicine, metoprolol tartrate, losartan, rosuvastatin, allopurinol, and clopidogrel.  No orders of the defined types were placed in this encounter.

## 2023-03-25 NOTE — Assessment & Plan Note (Signed)
On statin therapy. Followed by cardiologist. Will return for fasting lipids.

## 2023-03-25 NOTE — Progress Notes (Signed)
Cardiology Office Note:  .   Date:  03/25/2023  ID:  Michael Hart, DOB Apr 02, 1986, MRN [id number] PCP: Michael Shackleton, NP-C  [facility] Providers Cardiologist:  Michael Mainland, MD PCP: Michael Shackleton, NP-C  Chief Complaint  Patient presents with   Palpitations      History of Present Illness: .    Michael Hart is a 37 y.o. male with hypertension, hyperlipidemia, CAD  Discussed the use of AI scribe software for clinical note transcription with the patient, who gave verbal consent to proceed.   He denies chest pain. Palpitations persist, worse after sun exposure and sweating. He has identified caffeine as a trigger and have eliminated it from his diet. He also suspect that gas from a plant-based diet, specifically chickpeas and hummus, may be triggering the palpitations. They have found that Gatorade sometimes helps, possibly by replenishing electrolytes. The patient reports that their heart rate increases to the nineties and hundreds during these episodes, which is higher than their usual resting heart rate in the sixties. They are currently taking metoprolol 50mg  twice daily, with the episodes of palpitations often occurring in the evening before their second dose. The patient has a goal to minimize medication use and has adopted a plant-based diet and regular exercise regimen to improve their overall health. They have also lost some weight. The patient also reports high triglycerides and is currently on Crestor 20mg  for cholesterol management. Reviewed recent test results with the patient, details below.    Vitals:   03/25/23 1325  BP: 116/74  Pulse: 64  SpO2: 96%     ROS:  Review of Systems  Cardiovascular:  Positive for palpitations. Negative for chest pain, dyspnea on exertion, leg swelling and syncope.     Studies Reviewed: .       April-July 2024: Chol 162, TG 273, HDL 34, dLDL 95  Zio patch 9 days 09/23/2022 - 10/02/2022: Dominant rhythm:  Sinus. HR 57-144 bpm. Avg HR 82 bpm, in sinus rhythm. 0 episodes of SVT. <1% isolated SVE, couplets. 0 episodes of VT. <1% isolated VE, couplet. No atrial fibrillation/atrial flutter/SVT/VT/high grade AV block, sinus pause >3sec noted. 9 patient triggered events, correlated with sinus rhythm/VE.      Physical Exam:   Physical Exam Vitals and nursing note reviewed.  Constitutional:      General: He is not in acute distress. Neck:     Vascular: No JVD.  Cardiovascular:     Rate and Rhythm: Normal rate and regular rhythm.     Heart sounds: Normal heart sounds. No murmur heard. Pulmonary:     Effort: Pulmonary effort is normal.     Breath sounds: Normal breath sounds. No wheezing or rales.  Musculoskeletal:     Right lower leg: No edema.     Left lower leg: No edema.      VISIT DIAGNOSES:   ICD-10-CM   1. Coronary artery disease involving native coronary artery of native heart without angina pectoris  I25.10 clopidogrel (PLAVIX) 75 MG tablet    2. Mixed hyperlipidemia  E78.2 Lipid panel    3. Medication management  Z79.899 Lipid panel    4. Premature atrial complexes  I49.1     5. Premature ventricular complex  I49.3        ASSESSMENT AND PLAN: .    Michael Hart is a 37 y.o. male with hypertension, hyperlipidemia, CAD   CAD: S/p primary PCI to LAD in setting of STEMI (02/2022), followed by  CABG x 2 (LIMA-RI, left radials-d RCA). No angina symptoms at this time. Given MI, PCI, multivessel CAD, at early age, I recommend DAPT for 3 years following heart attack which would be 02/2025.  Okay to change Brilinta to Plavix, continue aspirin. Continue metoprolol tartrate 50 mg twice daily. LVEF 40-45% in the setting of STEMI.  Check echocardiogram now. Chol 162, TG 273, HDL 34, dLDL 95 (09/2022) Patient has made some changes to his diet recently and is optimistic that his lipids will be improved.  He has a upcoming lipid panel with his PCP today.  If LDL remains >70,  recommend increasing Crestor to 40 mg daily.  In future, if triglyceride remains elevated in spite of LDL control <55, could consider referral to lipid clinic.   Palpitations: Symptomatic PAC/PVC, but very low percentage <1%. Patient was to minimize additional medications at this time.  Therefore, I will continue metoprolol tartrate 50 mg twice daily and continue to ruminate triggers such as caffeine, certain diets.   Meds ordered this encounter  Medications   clopidogrel (PLAVIX) 75 MG tablet    Sig: Take 1 tablet (75 mg total) by mouth daily.    Dispense:  90 tablet    Refill:  3    Discontinue Brilinta     F/u in 3 months  Signed, Elder Negus, MD

## 2023-03-25 NOTE — Patient Instructions (Signed)
Medication Instructions:  Your physician recommends that you continue on your current medications as directed. Please refer to the Current Medication list given to you today.  *If you need a refill on your cardiac medications before your next appointment, please call your pharmacy*   Lab Work: Please make an appointment to have a FASTING lipid panel drawn here in our lab in 3 months.   If you have labs (blood work) drawn today and your tests are completely normal, you will receive your results only by: MyChart Message (if you have MyChart) OR A paper copy in the mail If you have any lab test that is abnormal or we need to change your treatment, we will call you to review the results.   Testing/Procedures: None.   Follow-Up:    Your next appointment:   6 month(s)  Provider:   Dr. Truett Mainland

## 2023-03-25 NOTE — Assessment & Plan Note (Signed)
Recheck A1c 

## 2023-03-25 NOTE — Assessment & Plan Note (Signed)
Eating a healthier diet, continue weight loss and exercise as tolerated. Continue medication. Followed by cardiology

## 2023-03-25 NOTE — Assessment & Plan Note (Signed)
Continue allopurinol. No flares since last visit. Has colchicine for prn. Continue low purine diet.

## 2023-03-25 NOTE — Patient Instructions (Signed)
Please return for fasting labs.

## 2023-03-25 NOTE — Assessment & Plan Note (Signed)
Continue Crestor 20 mg daily. Low fat diet and exercise. Follow up pending lipid panel

## 2023-03-30 ENCOUNTER — Encounter: Payer: Self-pay | Admitting: Cardiology

## 2023-03-30 ENCOUNTER — Other Ambulatory Visit (INDEPENDENT_AMBULATORY_CARE_PROVIDER_SITE_OTHER): Payer: BC Managed Care – PPO

## 2023-03-30 ENCOUNTER — Other Ambulatory Visit (HOSPITAL_COMMUNITY): Payer: Self-pay

## 2023-03-30 ENCOUNTER — Other Ambulatory Visit: Payer: Self-pay | Admitting: Cardiology

## 2023-03-30 ENCOUNTER — Other Ambulatory Visit: Payer: Self-pay | Admitting: Internal Medicine

## 2023-03-30 ENCOUNTER — Other Ambulatory Visit: Payer: Self-pay

## 2023-03-30 DIAGNOSIS — E782 Mixed hyperlipidemia: Secondary | ICD-10-CM

## 2023-03-30 DIAGNOSIS — E781 Pure hyperglyceridemia: Secondary | ICD-10-CM | POA: Diagnosis not present

## 2023-03-30 DIAGNOSIS — I10 Essential (primary) hypertension: Secondary | ICD-10-CM

## 2023-03-30 DIAGNOSIS — R002 Palpitations: Secondary | ICD-10-CM

## 2023-03-30 DIAGNOSIS — I2510 Atherosclerotic heart disease of native coronary artery without angina pectoris: Secondary | ICD-10-CM

## 2023-03-30 DIAGNOSIS — R7303 Prediabetes: Secondary | ICD-10-CM | POA: Diagnosis not present

## 2023-03-30 LAB — LIPID PANEL
Cholesterol: 154 mg/dL (ref 0–200)
HDL: 32.7 mg/dL — ABNORMAL LOW (ref >39.00)
LDL Cholesterol: 75 mg/dL (ref 0–99)
NonHDL: 121.04
Total CHOL/HDL Ratio: 5
Triglycerides: 232 mg/dL — ABNORMAL HIGH (ref 0.0–149.0)
VLDL: 46.4 mg/dL — ABNORMAL HIGH (ref 0.0–40.0)

## 2023-03-30 LAB — TSH: TSH: 2.02 u[IU]/mL (ref 0.35–5.50)

## 2023-03-30 LAB — HEMOGLOBIN A1C: Hgb A1c MFr Bld: 5.4 % (ref 4.6–6.5)

## 2023-03-31 ENCOUNTER — Other Ambulatory Visit (HOSPITAL_COMMUNITY): Payer: Self-pay

## 2023-03-31 NOTE — Telephone Encounter (Signed)
I am not able to see the triglyceride level here, but I gather it is still high. Can we refer him to our lipid clinic please?  Thanks MJP

## 2023-03-31 NOTE — Telephone Encounter (Signed)
Can pharmacist help [place] with this question. His plavix costs $26.23 for 3 month supply. I think the patient got Brilinta ?maybe free, although Im not sure. Would there be any way to reduce the cost?  Thanks MJP

## 2023-03-31 NOTE — Telephone Encounter (Signed)
Im sorry I see the labs in Epic now. I was looking under the picture in the message and couldn't see it. TG remains elevated in spite of controlled A1C. Also, LDL is 75, which is above the goal of 55. Please place lipid clinic referral.  Thanks MJP

## 2023-04-01 ENCOUNTER — Other Ambulatory Visit (HOSPITAL_COMMUNITY): Payer: Self-pay

## 2023-04-01 MED ORDER — LOSARTAN POTASSIUM 50 MG PO TABS
50.0000 mg | ORAL_TABLET | Freq: Every evening | ORAL | 1 refills | Status: DC
Start: 2023-04-01 — End: 2023-09-06
  Filled 2023-04-01: qty 90, 90d supply, fill #0
  Filled 2023-05-18 – 2023-07-09 (×3): qty 90, 90d supply, fill #1

## 2023-04-09 ENCOUNTER — Other Ambulatory Visit (HOSPITAL_COMMUNITY): Payer: Self-pay

## 2023-04-09 ENCOUNTER — Other Ambulatory Visit: Payer: Self-pay | Admitting: Cardiology

## 2023-04-09 MED FILL — Metoprolol Tartrate Tab 50 MG: ORAL | 90 days supply | Qty: 180 | Fill #0 | Status: AC

## 2023-04-10 ENCOUNTER — Other Ambulatory Visit (HOSPITAL_COMMUNITY): Payer: Self-pay

## 2023-05-05 ENCOUNTER — Telehealth: Payer: BC Managed Care – PPO | Admitting: Physician Assistant

## 2023-05-05 DIAGNOSIS — J208 Acute bronchitis due to other specified organisms: Secondary | ICD-10-CM

## 2023-05-05 DIAGNOSIS — B9689 Other specified bacterial agents as the cause of diseases classified elsewhere: Secondary | ICD-10-CM | POA: Diagnosis not present

## 2023-05-05 MED ORDER — BENZONATATE 100 MG PO CAPS
100.0000 mg | ORAL_CAPSULE | Freq: Three times a day (TID) | ORAL | 0 refills | Status: DC | PRN
Start: 2023-05-05 — End: 2023-09-06

## 2023-05-05 MED ORDER — ALBUTEROL SULFATE HFA 108 (90 BASE) MCG/ACT IN AERS
2.0000 | INHALATION_SPRAY | Freq: Four times a day (QID) | RESPIRATORY_TRACT | 0 refills | Status: DC | PRN
Start: 2023-05-05 — End: 2023-09-06

## 2023-05-05 MED ORDER — AZITHROMYCIN 250 MG PO TABS
ORAL_TABLET | ORAL | 0 refills | Status: AC
Start: 2023-05-05 — End: 2023-05-10

## 2023-05-05 NOTE — Patient Instructions (Signed)
Michael Hart, thank you for joining [name], PA-C for today's virtual visit.  While this provider is not your primary care provider (PCP), if your PCP is located in our provider database this encounter information will be shared with them immediately following your visit.   A [facility] MyChart account gives you access to today's visit and all your visits, tests, and labs performed at [facility] " click here if you don't have a [facility] MyChart account or go to [email or web address]  Consent: (Patient) Michael Hart provided verbal consent for this virtual visit at the beginning of the encounter.  Current Medications:  Current Outpatient Medications:    allopurinol (ZYLOPRIM) 100 MG tablet, Take 2 tablets (200 mg total) by mouth daily., Disp: 60 tablet, Rfl: 2   aspirin EC 81 MG tablet, Take 1 tablet (81 mg total) by mouth daily. Swallow whole., Disp: 30 tablet, Rfl: 12   cetirizine (ZYRTEC) 10 MG tablet, Take 1 tablet (10 mg total) by mouth daily as needed for allergies., Disp: 90 tablet, Rfl: 1   clopidogrel (PLAVIX) 75 MG tablet, Take 1 tablet (75 mg total) by mouth daily., Disp: 90 tablet, Rfl: 3   colchicine 0.6 MG tablet, Take 1 tablet (0.6 mg total) by mouth 2 (two) times daily. Take at onset of foot or ankle pain., Disp: 30 tablet, Rfl: 0   losartan (COZAAR) 50 MG tablet, Take 1 tablet (50 mg total) by mouth every evening., Disp: 90 tablet, Rfl: 1   metoprolol tartrate (LOPRESSOR) 50 MG tablet, Take 1 tablet (50 mg) by mouth 2 times daily., Disp: 180 tablet, Rfl: 3   rosuvastatin (CRESTOR) 20 MG tablet, Take 1 tablet (20 mg total) by mouth daily., Disp: 90 tablet, Rfl: 1   Medications ordered in this encounter:  No orders of the defined types were placed in this encounter.    *If you need refills on other medications prior to your next appointment, please contact your pharmacy*  Follow-Up: Call back or seek an in-person evaluation if the  symptoms worsen or if the condition fails to improve as anticipated.  [facility] [phone]  Other Instructions Take antibiotic (Azithromycin) as directed.  Increase fluids.  Get plenty of rest. Use Mucinex for congestion. Use the Tessalon and Albuterol as directed. Take a daily probiotic (I recommend Align or Culturelle, but even Activia Yogurt may be beneficial).  A humidifier placed in the bedroom may offer some relief for a dry, scratchy throat of nasal irritation.  Read information below on acute bronchitis. Please call or return to clinic if symptoms are not improving.  Acute Bronchitis Bronchitis is when the airways that extend from the windpipe into the lungs get red, puffy, and painful (inflamed). Bronchitis often causes thick spit (mucus) to develop. This leads to a cough. A cough is the most common symptom of bronchitis. In acute bronchitis, the condition usually begins suddenly and goes away over time (usually in 2 weeks). Smoking, allergies, and asthma can make bronchitis worse. Repeated episodes of bronchitis may cause more lung problems.  HOME CARE Rest. Drink enough fluids to keep your pee (urine) clear or pale yellow (unless you need to limit fluids as told by your doctor). Only take over-the-counter or prescription medicines as told by your doctor. Avoid smoking and secondhand smoke. These can make bronchitis worse. If you are a smoker, think about using nicotine gum or skin patches. Quitting smoking will help your lungs heal faster. Reduce the chance of  getting bronchitis again by: Washing your hands often. Avoiding people with cold symptoms. Trying not to touch your hands to your mouth, nose, or eyes. Follow up with your doctor as told.  GET HELP IF: Your symptoms do not improve after 1 week of treatment. Symptoms include: Cough. Fever. Coughing up thick spit. Body aches. Chest congestion. Chills. Shortness of breath. Sore throat.  GET HELP RIGHT  AWAY IF:  You have an increased fever. You have chills. You have severe shortness of breath. You have bloody thick spit (sputum). You throw up (vomit) often. You lose too much body fluid (dehydration). You have a severe headache. You faint.  MAKE SURE YOU:  Understand these instructions. Will watch your condition. Will get help right away if you are not doing well or get worse. Document Released: 11/25/2007 Document Revised: 02/08/2013 Document Reviewed: 11/29/2012 [facility] Patient Information 2015 [place], [place]. This information is not intended to replace advice given to you by your health care provider. Make sure you discuss any questions you have with your health care provider.    If you have been instructed to have an in-person evaluation today at a local Urgent Care facility, please use the link below. It will take you to a list of all of our available [facility], including address, phone number and hours of operation. Please do not delay care.  [facility]  If you or a family member do not have a primary care provider, use the link below to schedule a visit and establish care. When you choose a [facility] primary care physician or advanced practice provider, you gain a long-term partner in health. Find a Primary Care Provider  Learn more about [facility]'s in-office and virtual care options: [facility] - Get Care Now

## 2023-05-05 NOTE — Progress Notes (Signed)
Virtual Visit Consent   Michael Hart, you are scheduled for a virtual visit with a [facility] provider today. Just as with appointments in the office, your consent must be obtained to participate. Your consent will be active for this visit and any virtual visit you may have with one of our providers in the next 365 days. If you have a MyChart account, a copy of this consent can be sent to you electronically.  As this is a virtual visit, video technology does not allow for your provider to perform a traditional examination. This may limit your provider's ability to fully assess your condition. If your provider identifies any concerns that need to be evaluated in person or the need to arrange testing (such as labs, EKG, etc.), we will make arrangements to do so. Although advances in technology are sophisticated, we cannot ensure that it will always work on either your end or our end. If the connection with a video visit is poor, the visit may have to be switched to a telephone visit. With either a video or telephone visit, we are not always able to ensure that we have a secure connection.  By engaging in this virtual visit, you consent to the provision of healthcare and authorize for your insurance to be billed (if applicable) for the services provided during this visit. Depending on your insurance coverage, you may receive a charge related to this service.  I need to obtain your verbal consent now. Are you willing to proceed with your visit today? Michael Hart has provided verbal consent on 05/05/2023 for a virtual visit (video or telephone). [name], [place]  Date: 05/05/2023 7:06 PM  Virtual Visit via Video Note   I, [name], connected with  Michael Hart  ([id number], 39-Sep-1987) on 05/05/23 at  7:15 PM EST by a video-enabled telemedicine application and verified that I am speaking with the correct person using two identifiers.  Location: Patient: Virtual Visit  Location Patient: Home Provider: Virtual Visit Location Provider: Home Office   I discussed the limitations of evaluation and management by telemedicine and the availability of in person appointments. The patient expressed understanding and agreed to proceed.    History of Present Illness: Michael Hart is a 37 y.o. who identifies as a male who was assigned male at birth, and is being seen today for 1.5-2 weeks of cough associated with nasal and head congestion, sinus pressure and ear pressure. Notes sinus symptoms have improved but cough has worsened with substantial chest congestion and now cough has become productive of thick sputum. Notes SOB with coughing.   OTC -- Mucinex, Dayquil.   HPI: HPI  Problems:  Patient Active Problem List   Diagnosis Date Noted   Medication management 03/25/2023   Premature ventricular complex 03/25/2023   Premature atrial complexes 03/25/2023   Prediabetes 03/25/2023   Palpitations 09/23/2022   Visit for wound check 04/20/2022   Witnessed episode of apnea 04/16/2022   At risk for sleep apnea 04/16/2022   Thrombocytosis 04/16/2022   S/P CABG x 2 03/27/2022: LIMA-RI, free RIMA-dRCA    HFrEF (heart failure with reduced ejection fraction) (HCC)    Coronary artery disease 03/27/2022   STEMI (ST elevation myocardial infarction) (HCC) 03/21/2022   Primary hypertension 03/03/2022   Hypertriglyceridemia 03/03/2022   Acute right ankle pain 03/03/2022   Family history of early CAD 01/28/2022   Chronic gout without tophus 01/28/2022   Elevated blood pressure reading in office without diagnosis  of hypertension 01/28/2022   Obesity (BMI 30-39.9) 01/28/2022   Migraine with aura and without status migrainosus, not intractable 11/24/2021   Hyperglycemia 04/19/2019   Bereavement 04/10/2019   Carpal tunnel syndrome of left wrist 11/30/2018   Anxiety 04/08/2018   Chronic or recurrent subluxation of peroneal tendon of right foot 07/29/2017   Hematuria  04/05/2017   History of gout 10/22/2015   Low testosterone 10/22/2015   Mixed hyperlipidemia 12/05/2014   Obese     Allergies:  Allergies  Allergen Reactions   Penicillins Shortness Of Breath, Swelling and Other (See Comments)    Childhood reaction   Sulfa Antibiotics Shortness Of Breath, Swelling and Other (See Comments)    Childhood reaction   Medications:  Current Outpatient Medications:    albuterol (VENTOLIN HFA) 108 (90 Base) MCG/ACT inhaler, Inhale 2 puffs into the lungs every 6 (six) hours as needed for wheezing or shortness of breath., Disp: 8 g, Rfl: 0   azithromycin (ZITHROMAX) 250 MG tablet, Take 2 tablets on day 1, then 1 tablet daily on days 2 through 5, Disp: 6 tablet, Rfl: 0   benzonatate (TESSALON) 100 MG capsule, Take 1 capsule (100 mg total) by mouth 3 (three) times daily as needed for cough., Disp: 30 capsule, Rfl: 0   allopurinol (ZYLOPRIM) 100 MG tablet, Take 2 tablets (200 mg total) by mouth daily., Disp: 60 tablet, Rfl: 2   aspirin EC 81 MG tablet, Take 1 tablet (81 mg total) by mouth daily. Swallow whole., Disp: 30 tablet, Rfl: 12   cetirizine (ZYRTEC) 10 MG tablet, Take 1 tablet (10 mg total) by mouth daily as needed for allergies., Disp: 90 tablet, Rfl: 1   clopidogrel (PLAVIX) 75 MG tablet, Take 1 tablet (75 mg total) by mouth daily., Disp: 90 tablet, Rfl: 3   colchicine 0.6 MG tablet, Take 1 tablet (0.6 mg total) by mouth 2 (two) times daily. Take at onset of foot or ankle pain., Disp: 30 tablet, Rfl: 0   losartan (COZAAR) 50 MG tablet, Take 1 tablet (50 mg total) by mouth every evening., Disp: 90 tablet, Rfl: 1   metoprolol tartrate (LOPRESSOR) 50 MG tablet, Take 1 tablet (50 mg) by mouth 2 times daily., Disp: 180 tablet, Rfl: 3   rosuvastatin (CRESTOR) 20 MG tablet, Take 1 tablet (20 mg total) by mouth daily., Disp: 90 tablet, Rfl: 1  Observations/Objective: Patient is well-developed, well-nourished in no acute distress.  Resting comfortably at home.   Head is normocephalic, atraumatic.  No labored breathing. Speech is clear and coherent with logical content.  Patient is alert and oriented at baseline.   Assessment and Plan: 1. Acute bacterial bronchitis - benzonatate (TESSALON) 100 MG capsule; Take 1 capsule (100 mg total) by mouth 3 (three) times daily as needed for cough.  Dispense: 30 capsule; Refill: 0 - azithromycin (ZITHROMAX) 250 MG tablet; Take 2 tablets on day 1, then 1 tablet daily on days 2 through 5  Dispense: 6 tablet; Refill: 0 - albuterol (VENTOLIN HFA) 108 (90 Base) MCG/ACT inhaler; Inhale 2 puffs into the lungs every 6 (six) hours as needed for wheezing or shortness of breath.  Dispense: 8 g; Refill: 0  Rx Azithromycin.  Increase fluids.  Rest.  Saline nasal spray.  Probiotic.  Mucinex as directed.  Humidifier in bedroom. Tessalon and albuterol per orders.  Call or return to clinic if symptoms are not improving.   Follow Up Instructions: I discussed the assessment and treatment plan with the patient. The patient was provided  an opportunity to ask questions and all were answered. The patient agreed with the plan and demonstrated an understanding of the instructions.  A copy of instructions were sent to the patient via MyChart unless otherwise noted below.   The patient was advised to call back or seek an in-person evaluation if the symptoms worsen or if the condition fails to improve as anticipated.    [name], PA-C

## 2023-05-12 ENCOUNTER — Other Ambulatory Visit (HOSPITAL_COMMUNITY): Payer: Self-pay

## 2023-05-18 ENCOUNTER — Other Ambulatory Visit: Payer: Self-pay

## 2023-05-18 ENCOUNTER — Other Ambulatory Visit (HOSPITAL_COMMUNITY): Payer: Self-pay

## 2023-05-18 MED FILL — Metoprolol Tartrate Tab 50 MG: ORAL | 90 days supply | Qty: 180 | Fill #1 | Status: CN

## 2023-05-28 ENCOUNTER — Other Ambulatory Visit (HOSPITAL_COMMUNITY): Payer: Self-pay

## 2023-05-31 ENCOUNTER — Other Ambulatory Visit (HOSPITAL_COMMUNITY): Payer: Self-pay

## 2023-05-31 MED FILL — Metoprolol Tartrate Tab 50 MG: ORAL | 90 days supply | Qty: 180 | Fill #1 | Status: AC

## 2023-07-01 ENCOUNTER — Other Ambulatory Visit: Payer: BC Managed Care – PPO

## 2023-07-01 DIAGNOSIS — Z79899 Other long term (current) drug therapy: Secondary | ICD-10-CM

## 2023-07-01 DIAGNOSIS — E782 Mixed hyperlipidemia: Secondary | ICD-10-CM

## 2023-07-12 ENCOUNTER — Other Ambulatory Visit (HOSPITAL_COMMUNITY): Payer: Self-pay

## 2023-09-05 ENCOUNTER — Other Ambulatory Visit: Payer: Self-pay | Admitting: Family Medicine

## 2023-09-05 ENCOUNTER — Other Ambulatory Visit: Payer: Self-pay | Admitting: Cardiology

## 2023-09-05 DIAGNOSIS — I2510 Atherosclerotic heart disease of native coronary artery without angina pectoris: Secondary | ICD-10-CM

## 2023-09-05 DIAGNOSIS — I10 Essential (primary) hypertension: Secondary | ICD-10-CM

## 2023-09-06 ENCOUNTER — Ambulatory Visit: Payer: BC Managed Care – PPO | Attending: Cardiology | Admitting: Cardiology

## 2023-09-06 ENCOUNTER — Encounter: Payer: Self-pay | Admitting: Cardiology

## 2023-09-06 ENCOUNTER — Other Ambulatory Visit: Payer: Self-pay

## 2023-09-06 ENCOUNTER — Other Ambulatory Visit (HOSPITAL_COMMUNITY): Payer: Self-pay

## 2023-09-06 VITALS — BP 122/82 | HR 73 | Ht 69.5 in | Wt 246.4 lb

## 2023-09-06 DIAGNOSIS — I2510 Atherosclerotic heart disease of native coronary artery without angina pectoris: Secondary | ICD-10-CM

## 2023-09-06 DIAGNOSIS — I10 Essential (primary) hypertension: Secondary | ICD-10-CM | POA: Diagnosis not present

## 2023-09-06 DIAGNOSIS — E782 Mixed hyperlipidemia: Secondary | ICD-10-CM

## 2023-09-06 MED ORDER — CLOPIDOGREL BISULFATE 75 MG PO TABS
75.0000 mg | ORAL_TABLET | Freq: Every day | ORAL | 3 refills | Status: AC
  Filled 2023-09-09: qty 90, 90d supply, fill #0
  Filled 2023-10-23 – 2023-11-03 (×2): qty 90, 90d supply, fill #1
  Filled 2023-12-07 – 2024-03-07 (×2): qty 90, 90d supply, fill #2
  Filled 2024-06-10: qty 90, 90d supply, fill #3

## 2023-09-06 MED ORDER — LOSARTAN POTASSIUM 50 MG PO TABS
50.0000 mg | ORAL_TABLET | Freq: Every evening | ORAL | 3 refills | Status: DC
Start: 2023-09-06 — End: 2024-05-11
  Filled 2023-09-06 – 2023-11-03 (×3): qty 90, 90d supply, fill #0

## 2023-09-06 MED ORDER — NITROGLYCERIN 0.4 MG SL SUBL
0.4000 mg | SUBLINGUAL_TABLET | SUBLINGUAL | 3 refills | Status: AC | PRN
  Filled 2023-09-06: qty 75, 21d supply, fill #0

## 2023-09-06 MED ORDER — ASPIRIN 81 MG PO TBEC
81.0000 mg | DELAYED_RELEASE_TABLET | Freq: Every day | ORAL | 1 refills | Status: DC
  Filled 2023-09-06: qty 90, 90d supply, fill #0
  Filled 2023-12-07: qty 90, 90d supply, fill #1

## 2023-09-06 MED ORDER — ROSUVASTATIN CALCIUM 20 MG PO TABS
20.0000 mg | ORAL_TABLET | Freq: Every day | ORAL | 0 refills | Status: DC
  Filled 2023-09-06: qty 90, 90d supply, fill #0

## 2023-09-06 MED ORDER — METOPROLOL TARTRATE 50 MG PO TABS
50.0000 mg | ORAL_TABLET | Freq: Two times a day (BID) | ORAL | 3 refills | Status: DC
  Filled 2023-09-06: qty 180, 90d supply, fill #0
  Filled 2023-10-23 – 2023-11-03 (×2): qty 180, 90d supply, fill #1
  Filled 2024-03-07: qty 180, 90d supply, fill #2
  Filled 2024-07-12: qty 180, 90d supply, fill #3

## 2023-09-06 NOTE — Progress Notes (Signed)
 Cardiology Office Note:  .   Date:  09/06/2023  ID:  Michael Hart, DOB Feb 21, 1986, MRN [id number] PCP: Avanell Shackleton, NP-C  [facility] Providers Cardiologist:  Truett Mainland, MD PCP: Avanell Shackleton, NP-C  Chief Complaint  Patient presents with   Coronary Artery Disease      History of Present Illness: .    Michael Hart is a 38 y.o. male with hypertension, hyperlipidemia, CAD  Patient is doing well.  Symptoms of palpitations have significantly improved after cutting down caffeine intake.  He gained weight recently with dietary indiscretions, but working on losing weight.  He has not had recent lipid panel checked.   Vitals:   09/06/23 1320 09/06/23 1643  BP: (!) 140/82 122/82  Pulse: 73   SpO2: 97%      ROS:  Review of Systems  Cardiovascular:  Negative for chest pain, dyspnea on exertion, leg swelling, palpitations and syncope.     Studies Reviewed: Marland Kitchen       EKG 09/06/2023: Normal sinus rhythm with sinus arrhythmia Right axis deviation Septal infarct (cited on or before 21-Mar-2022) When compared with ECG of 19-Oct-2022 22:23, No significant change was found  Echocardiogram 10/2022: LVEF 55 to 60%  April-July 2024: Chol 162, TG 273, HDL 34, dLDL 95  Zio patch 9 days 09/23/2022 - 10/02/2022: Dominant rhythm: Sinus. HR 57-144 bpm. Avg HR 82 bpm, in sinus rhythm. 0 episodes of SVT. <1% isolated SVE, couplets. 0 episodes of VT. <1% isolated VE, couplet. No atrial fibrillation/atrial flutter/SVT/VT/high grade AV block, sinus pause >3sec noted. 9 patient triggered events, correlated with sinus rhythm/VE.      Physical Exam:   Physical Exam Vitals and nursing note reviewed.  Constitutional:      General: He is not in acute distress. Neck:     Vascular: No JVD.  Cardiovascular:     Rate and Rhythm: Normal rate and regular rhythm.     Heart sounds: Normal heart sounds. No murmur heard. Pulmonary:     Effort: Pulmonary effort is  normal.     Breath sounds: Normal breath sounds. No wheezing or rales.  Musculoskeletal:     Right lower leg: No edema.     Left lower leg: No edema.     VISIT DIAGNOSES:   ICD-10-CM   1. Mixed hyperlipidemia  E78.2 Lipid panel    2. Coronary artery disease involving native coronary artery of native heart without angina pectoris  I25.10 EKG 12-Lead    clopidogrel (PLAVIX) 75 MG tablet    losartan (COZAAR) 50 MG tablet    3. Primary hypertension  I10 losartan (COZAAR) 50 MG tablet       ASSESSMENT AND PLAN: .    Michael Hart is a 38 y.o. male with hypertension, hyperlipidemia, CAD   CAD: S/p primary PCI to LAD in setting of STEMI (02/2022), followed by CABG x 2 (LIMA-RI, left radials-d RCA). No angina symptoms at this time. Given MI, PCI, multivessel CAD, at early age, I recommend DAPT with aspirin and Plavix for 3 years following heart attack which would be 02/2025.  After that, I would recommend Plavix monotherapy.  Continue metoprolol tartrate 50 mg twice daily. Continue Crestor 20 mg daily. Check fasting lipid panel.  If LDL remains >70, will strongly consider addition of PCSK9 elevator.  Palpitations: Improved.  Continue metoprolol tartrate 50 mg twice daily.  Hypertension: Well-controlled.    Meds ordered this encounter  Medications   nitroGLYCERIN (NITROSTAT) 0.4 MG SL  tablet    Sig: Place 1 tablet (0.4 mg total) under the tongue every 5 (five) minutes as needed for chest pain.    Dispense:  75 tablet    Refill:  3   aspirin EC 81 MG tablet    Sig: Take 1 tablet (81 mg total) by mouth daily. Swallow whole.    Dispense:  90 tablet    Refill:  1   clopidogrel (PLAVIX) 75 MG tablet    Sig: Take 1 tablet (75 mg total) by mouth daily.    Dispense:  90 tablet    Refill:  3   losartan (COZAAR) 50 MG tablet    Sig: Take 1 tablet (50 mg total) by mouth every evening.    Dispense:  90 tablet    Refill:  3   metoprolol tartrate (LOPRESSOR) 50 MG tablet    Sig:  Take 1 tablet (50 mg) by mouth 2 times daily.    Dispense:  180 tablet    Refill:  3     F/u in 3 months  Signed, Elder Negus, MD

## 2023-09-06 NOTE — Patient Instructions (Signed)
 Medication Instructions:  Refilled Aspirin, plavix, losartan, metoprolol   START Nitroglycerin  *If you need a refill on your cardiac medications before your next appointment, please call your pharmacy*   Lab Work next week : Fasting lipid   If you have labs (blood work) drawn today and your tests are completely normal, you will receive your results only by: MyChart Message (if you have MyChart) OR A paper copy in the mail If you have any lab test that is abnormal or we need to change your treatment, we will call you to review the results.   Follow-Up: At [facility], you and your health needs are our priority.  As part of our continuing mission to provide you with exceptional heart care, we have created designated Provider Care Teams.  These Care Teams include your primary Cardiologist (physician) and Advanced Practice Providers (APPs -  Physician Assistants and Nurse Practitioners) who all work together to provide you with the care you need, when you need it.    Your next appointment:   6 month(s)  Provider:   Elder Negus, MD     Other Instructions   1st Floor: - Lobby - Registration  - Pharmacy  - Lab - Cafe  2nd Floor: - PV Lab - Diagnostic Testing (echo, CT, nuclear med)  3rd Floor: - Vacant  4th Floor: - TCTS (cardiothoracic surgery) - AFib Clinic - Structural Heart Clinic - Vascular Surgery  - Vascular Ultrasound  5th Floor: - HeartCare Cardiology (general and EP) - Clinical Pharmacy for coumadin, hypertension, lipid, weight-loss medications, and med management appointments    Valet parking services will be available as well.

## 2023-09-07 ENCOUNTER — Other Ambulatory Visit (HOSPITAL_COMMUNITY): Payer: Self-pay

## 2023-09-08 ENCOUNTER — Other Ambulatory Visit (HOSPITAL_COMMUNITY): Payer: Self-pay

## 2023-09-09 ENCOUNTER — Other Ambulatory Visit (HOSPITAL_COMMUNITY): Payer: Self-pay

## 2023-09-10 ENCOUNTER — Other Ambulatory Visit (HOSPITAL_COMMUNITY): Payer: Self-pay

## 2023-09-13 ENCOUNTER — Other Ambulatory Visit (HOSPITAL_COMMUNITY): Payer: Self-pay

## 2023-09-16 DIAGNOSIS — E782 Mixed hyperlipidemia: Secondary | ICD-10-CM | POA: Diagnosis not present

## 2023-09-17 LAB — LIPID PANEL
Chol/HDL Ratio: 5 ratio (ref 0.0–5.0)
Cholesterol, Total: 161 mg/dL (ref 100–199)
HDL: 32 mg/dL — ABNORMAL LOW (ref >39)
LDL Chol Calc (NIH): 76 mg/dL (ref 0–99)
Triglycerides: 330 mg/dL — ABNORMAL HIGH (ref 0–149)
VLDL Cholesterol Cal: 53 mg/dL — ABNORMAL HIGH (ref 5–40)

## 2023-09-17 NOTE — Progress Notes (Signed)
 S/p CABG LDL remains slightly higher than 70, triglycerides remain significantly elevated, higher than last check in 03/2023. Currently on Crestor 20 mg daily. Recommend referral to lipid clinic for consideration for PCSK9 inhibitors as well as fenofibrate or Vascepa.  Thanks MJP

## 2023-09-23 ENCOUNTER — Ambulatory Visit: Payer: BC Managed Care – PPO | Admitting: Family Medicine

## 2023-09-29 ENCOUNTER — Other Ambulatory Visit: Payer: Self-pay

## 2023-09-29 DIAGNOSIS — E782 Mixed hyperlipidemia: Secondary | ICD-10-CM

## 2023-10-23 ENCOUNTER — Other Ambulatory Visit: Payer: Self-pay | Admitting: Family Medicine

## 2023-10-23 ENCOUNTER — Other Ambulatory Visit (HOSPITAL_COMMUNITY): Payer: Self-pay

## 2023-10-25 ENCOUNTER — Other Ambulatory Visit: Payer: Self-pay

## 2023-10-25 ENCOUNTER — Other Ambulatory Visit (HOSPITAL_COMMUNITY): Payer: Self-pay

## 2023-10-25 MED ORDER — ROSUVASTATIN CALCIUM 20 MG PO TABS
20.0000 mg | ORAL_TABLET | Freq: Every day | ORAL | 0 refills | Status: DC
  Filled 2023-10-25 – 2023-12-07 (×4): qty 90, 90d supply, fill #0

## 2023-11-02 ENCOUNTER — Other Ambulatory Visit (HOSPITAL_COMMUNITY): Payer: Self-pay

## 2023-11-03 ENCOUNTER — Other Ambulatory Visit (HOSPITAL_COMMUNITY): Payer: Self-pay

## 2023-12-07 ENCOUNTER — Other Ambulatory Visit (HOSPITAL_COMMUNITY): Payer: Self-pay

## 2023-12-07 ENCOUNTER — Ambulatory Visit: Admitting: Pharmacist

## 2023-12-07 ENCOUNTER — Other Ambulatory Visit: Payer: Self-pay

## 2023-12-07 NOTE — Progress Notes (Deleted)
 Patient ID: [id number]                 DOB: 1985/07/08                    MRN: [id number]     HPI: Michael Hart is a 38 y.o. male patient referred to lipid clinic by ***. PMH is significant for   Current Medications:  Intolerances:  Risk Factors:  LDL goal:   Diet:   Exercise:   Family History:   Social History:   Labs: TC 161, Trigs 330, HDL 32, LDL 76  Past Medical History:  Diagnosis Date   Allergy    Asthma    Coronary artery disease    Gout    Kidney stone    Panic attacks    nocturnal, controls with self directed biofeedback   Primary hypertension 03/03/2022   S/P CABG x 2 03/27/2022: LIMA-RI, free RIMA-dRCA     Current Outpatient Medications on File Prior to Visit  Medication Sig Dispense Refill   allopurinol  (ZYLOPRIM ) 100 MG tablet Take 2 tablets (200 mg total) by mouth daily. 60 tablet 2   aspirin  EC 81 MG tablet Take 1 tablet (81 mg total) by mouth daily. Swallow whole. 90 tablet 1   cetirizine  (ZYRTEC ) 10 MG tablet Take 1 tablet (10 mg total) by mouth daily as needed for allergies. 90 tablet 1   clopidogrel  (PLAVIX ) 75 MG tablet Take 1 tablet (75 mg total) by mouth daily. 90 tablet 3   colchicine  0.6 MG tablet Take 1 tablet (0.6 mg total) by mouth 2 (two) times daily. Take at onset of foot or ankle pain. 30 tablet 0   losartan  (COZAAR ) 50 MG tablet Take 1 tablet (50 mg total) by mouth every evening. 90 tablet 3   metoprolol  tartrate (LOPRESSOR ) 50 MG tablet Take 1 tablet (50 mg) by mouth 2 times daily. 180 tablet 3   nitroGLYCERIN  (NITROSTAT ) 0.4 MG SL tablet Place 1 tablet (0.4 mg total) under the tongue every 5 (five) minutes as needed for chest pain. 75 tablet 3   rosuvastatin  (CRESTOR ) 20 MG tablet Take 1 tablet (20 mg total) by mouth daily. PLEASE SCHEDULE FASTING FOLLOW UP FOR REFILLS. 90 tablet 0   No current facility-administered medications on file prior to visit.    Allergies  Allergen Reactions   Penicillins Shortness Of Breath,  Swelling and Other (See Comments)    Childhood reaction   Sulfa Antibiotics Shortness Of Breath, Swelling and Other (See Comments)    Childhood reaction    Assessment/Plan:  1. Hyperlipidemia -  Reviewed options for lowering LDL cholesterol, including statins, ezetimibe, PCSK9 inhibitors, Nexletol/Nexlizet, and Leqvio. Discussed efficacy, dosing, side effects, and copay information.

## 2024-01-19 ENCOUNTER — Encounter: Payer: Self-pay | Admitting: Pharmacist Clinician (PhC)/ Clinical Pharmacy Specialist

## 2024-01-19 ENCOUNTER — Ambulatory Visit: Attending: Cardiology | Admitting: Pharmacist Clinician (PhC)/ Clinical Pharmacy Specialist

## 2024-01-19 DIAGNOSIS — E782 Mixed hyperlipidemia: Secondary | ICD-10-CM | POA: Diagnosis not present

## 2024-01-19 LAB — LIPID PANEL

## 2024-01-19 NOTE — Assessment & Plan Note (Signed)
 Assessment: Patient with ASCVD not at LDL goal of < 70 or TG goal of < 150 Most recent LDL 76 on 09/16/23 Most recent TG 330 on 09/16/23 Has been compliant with high intensity statin : rosuvastatin  20 Reviewed options for lowering LDL cholesterol, including ezetimibe, PCSK-9 inhibitors, bempedoic acid and inclisiran.  Discussed mechanisms of action, dosing, side effects, potential decreases in LDL cholesterol and costs.  Also reviewed potential options for patient assistance.  Plan: Repeat labs today, patient has been on mostly plant based diet for last month or more If LDL not at goal, will add Repatha 140 mg q14d If TG not at goal, will add icosapent  ethyl 2 gm bid Repeat labs after:  3 months Lipid Liver function Will give out appropriate manufacture copay card information as necessary.

## 2024-01-19 NOTE — Patient Instructions (Signed)
 Your Results:             Your most recent labs Goal  Total Cholesterol 161 < 200  Triglycerides 330 < 150  HDL (happy/good cholesterol) 32 > 40  LDL (lousy/bad cholesterol 76 < 70   Get labs drawn today.  If LDL is > 70 we will get prior authorization for Repatha.  If it is < 70 we will just continue with the rosuvastatin .  For your triglycerides, we will most likely try to get icosapent  ethyl (Vascepa ) covered by your insurance.  If this is cost prohibitive, we can try fenofibrate in it's place.    Thank you for choosing [facility]

## 2024-01-19 NOTE — Progress Notes (Signed)
 Office Visit    Patient Name: Michael Hart Date of Encounter: 01/19/2024  Primary Care Provider:  Lendia Boby CROME, NP-C Primary Cardiologist:  Newman JINNY Lawrence, MD  Chief Complaint    Hyperlipidemia   Significant Past Medical History   CAD 9/23 STEMI w/CABG x 2 (age 38)  HTN Well controlled  palpitations Decreased w/ cutting back on caffeine, on metoprolol  tart 50 bid           Allergies  Allergen Reactions   Penicillins Shortness Of Breath, Swelling and Other (See Comments)    Childhood reaction   Sulfa Antibiotics Shortness Of Breath, Swelling and Other (See Comments)    Childhood reaction    History of Present Illness    Michael Hart is a 38 y.o. male patient of Dr Lawrence, in the office today to discuss options for cholesterol management.  Insurance Carrier: Medical laboratory scientific officer:   Darryle Long    LDL Cholesterol goal:  LDL < 70, TG < 150  Current Medications:   rosuvastatin  20 mg daily  Family Hx: paternal grandmother died 23's MI, maternal grandfather died 10 from MI, maternal uncle had CABG; father had valve replacement, mom with recent coronary stent; no siblings; kids 9,10    Social Hx: Tobacco: quit with kids Alcohol: occasionally    Diet:   currently eating a mostly plant based diet; using beans for protein, some chicken; plenty of fresh vegetables; snacks on mostly fruit, carrots/hummus.    Exercise: has 3 mile trail near home, walks most days or bikes 6 miles   Accessory Clinical Findings   Lab Results  Component Value Date   CHOL 161 09/16/2023   HDL 32 (L) 09/16/2023   LDLCALC 76 09/16/2023   LDLDIRECT 95.0 09/23/2022   TRIG 330 (H) 09/16/2023   CHOLHDL 5.0 09/16/2023    Lipoprotein (a)  Date/Time Value Ref Range Status  03/21/2022 04:08 AM 63.4 (H) <75.0 nmol/L Final    Comment:    (NOTE) Note:  Values greater than or equal to 75.0 nmol/L may       indicate an independent risk factor for CHD,       but must be  evaluated with caution when applied       to non-Caucasian populations due to the       influence of genetic factors on Lp(a) across       ethnicities. Performed At: [facility] [address] [place], [place] [id number] Jennette Shorter MD Ey:[id number]     Lab Results  Component Value Date   ALT 23 10/19/2022   AST 24 10/19/2022   ALKPHOS 55 10/19/2022   BILITOT 0.7 10/19/2022   Lab Results  Component Value Date   CREATININE 0.78 01/12/2023   BUN 9 01/12/2023   NA 141 01/12/2023   K 4.1 01/12/2023   CL 102 01/12/2023   CO2 22 01/12/2023   Lab Results  Component Value Date   HGBA1C 5.4 03/30/2023    Home Medications    Current Outpatient Medications  Medication Sig Dispense Refill   allopurinol  (ZYLOPRIM ) 100 MG tablet Take 2 tablets (200 mg total) by mouth daily. (Patient taking differently: Take 200 mg by mouth daily as needed.) 60 tablet 2   aspirin  EC 81 MG tablet Take 1 tablet (81 mg total) by mouth daily. Swallow whole. 90 tablet 1   cetirizine  (ZYRTEC ) 10 MG tablet Take 1 tablet (10 mg total) by mouth daily as needed for allergies. 90 tablet 1  clopidogrel  (PLAVIX ) 75 MG tablet Take 1 tablet (75 mg total) by mouth daily. 90 tablet 3   colchicine  0.6 MG tablet Take 1 tablet (0.6 mg total) by mouth 2 (two) times daily. Take at onset of foot or ankle pain. 30 tablet 0   metoprolol  tartrate (LOPRESSOR ) 50 MG tablet Take 1 tablet (50 mg) by mouth 2 times daily. 180 tablet 3   nitroGLYCERIN  (NITROSTAT ) 0.4 MG SL tablet Place 1 tablet (0.4 mg total) under the tongue every 5 (five) minutes as needed for chest pain. 75 tablet 3   rosuvastatin  (CRESTOR ) 20 MG tablet Take 1 tablet (20 mg total) by mouth daily. 90 tablet 0   losartan  (COZAAR ) 50 MG tablet Take 1 tablet (50 mg total) by mouth every evening. (Patient not taking: Reported on 01/19/2024) 90 tablet 3   No current facility-administered medications for this visit.     Assessment & Plan    Mixed  hyperlipidemia Assessment: Patient with ASCVD not at LDL goal of < 70 or TG goal of < 150 Most recent LDL 76 on 09/16/23 Most recent TG 330 on 09/16/23 Has been compliant with high intensity statin : rosuvastatin  20 Reviewed options for lowering LDL cholesterol, including ezetimibe, PCSK-9 inhibitors, bempedoic acid and inclisiran.  Discussed mechanisms of action, dosing, side effects, potential decreases in LDL cholesterol and costs.  Also reviewed potential options for patient assistance.  Plan: Repeat labs today, patient has been on mostly plant based diet for last month or more If LDL not at goal, will add Repatha 140 mg q14d If TG not at goal, will add icosapent  ethyl 2 gm bid Repeat labs after:  3 months Lipid Liver function Will give out appropriate manufacture copay card information as necessary.   Dorinne Graeff, PharmD CPP [facility] [address]   [place], [place] [address] [phone]  01/19/2024, 8:55 AM

## 2024-01-20 LAB — HEPATIC FUNCTION PANEL
ALT: 22 IU/L (ref 0–44)
AST: 21 IU/L (ref 0–40)
Albumin: 4.5 g/dL (ref 4.1–5.1)
Alkaline Phosphatase: 52 IU/L (ref 44–121)
Bilirubin Total: 0.3 mg/dL (ref 0.0–1.2)
Bilirubin, Direct: 0.12 mg/dL (ref 0.00–0.40)
Total Protein: 7 g/dL (ref 6.0–8.5)

## 2024-01-20 LAB — LIPID PANEL
Cholesterol, Total: 145 mg/dL (ref 100–199)
HDL: 33 mg/dL — AB (ref >39)
LDL CALC COMMENT:: 4.4 ratio (ref 0.0–5.0)
LDL Chol Calc (NIH): 71 mg/dL (ref 0–99)
Triglycerides: 252 mg/dL — AB (ref 0–149)
VLDL Cholesterol Cal: 41 mg/dL — AB (ref 5–40)

## 2024-01-21 ENCOUNTER — Ambulatory Visit: Payer: Self-pay | Admitting: Pharmacist Clinician (PhC)/ Clinical Pharmacy Specialist

## 2024-01-21 ENCOUNTER — Other Ambulatory Visit (HOSPITAL_COMMUNITY): Payer: Self-pay

## 2024-01-21 MED ORDER — ICOSAPENT ETHYL 1 G PO CAPS
2.0000 g | ORAL_CAPSULE | Freq: Two times a day (BID) | ORAL | 6 refills | Status: DC
  Filled 2024-01-21 – 2024-03-07 (×3): qty 120, 30d supply, fill #0
  Filled 2024-05-15: qty 120, 30d supply, fill #1

## 2024-01-24 ENCOUNTER — Other Ambulatory Visit (HOSPITAL_COMMUNITY): Payer: Self-pay

## 2024-01-26 ENCOUNTER — Other Ambulatory Visit (HOSPITAL_BASED_OUTPATIENT_CLINIC_OR_DEPARTMENT_OTHER): Payer: Self-pay

## 2024-02-03 ENCOUNTER — Other Ambulatory Visit (HOSPITAL_COMMUNITY): Payer: Self-pay

## 2024-03-07 ENCOUNTER — Encounter: Payer: Self-pay | Admitting: Family Medicine

## 2024-03-07 ENCOUNTER — Other Ambulatory Visit: Payer: Self-pay

## 2024-03-07 ENCOUNTER — Other Ambulatory Visit: Payer: Self-pay | Admitting: Family Medicine

## 2024-03-07 ENCOUNTER — Other Ambulatory Visit (HOSPITAL_COMMUNITY): Payer: Self-pay

## 2024-03-07 DIAGNOSIS — Z23 Encounter for immunization: Secondary | ICD-10-CM | POA: Diagnosis not present

## 2024-03-09 ENCOUNTER — Other Ambulatory Visit (HOSPITAL_COMMUNITY): Payer: Self-pay

## 2024-03-09 MED ORDER — ROSUVASTATIN CALCIUM 20 MG PO TABS
20.0000 mg | ORAL_TABLET | Freq: Every day | ORAL | 0 refills | Status: DC
  Filled 2024-03-09 – 2024-04-04 (×4): qty 30, 30d supply, fill #0

## 2024-03-19 ENCOUNTER — Other Ambulatory Visit (HOSPITAL_COMMUNITY): Payer: Self-pay

## 2024-03-21 ENCOUNTER — Other Ambulatory Visit (HOSPITAL_COMMUNITY): Payer: Self-pay

## 2024-03-31 ENCOUNTER — Other Ambulatory Visit (HOSPITAL_COMMUNITY): Payer: Self-pay

## 2024-04-04 ENCOUNTER — Other Ambulatory Visit (HOSPITAL_COMMUNITY): Payer: Self-pay

## 2024-04-20 ENCOUNTER — Encounter: Admitting: Family Medicine

## 2024-05-04 ENCOUNTER — Ambulatory Visit: Admitting: Family Medicine

## 2024-05-11 ENCOUNTER — Ambulatory Visit: Admitting: Family Medicine

## 2024-05-11 ENCOUNTER — Encounter: Payer: Self-pay | Admitting: Family Medicine

## 2024-05-11 VITALS — BP 122/84 | HR 66 | Temp 98.5°F | Ht 69.5 in | Wt 250.0 lb

## 2024-05-11 DIAGNOSIS — I2510 Atherosclerotic heart disease of native coronary artery without angina pectoris: Secondary | ICD-10-CM

## 2024-05-11 DIAGNOSIS — I10 Essential (primary) hypertension: Secondary | ICD-10-CM

## 2024-05-11 DIAGNOSIS — E669 Obesity, unspecified: Secondary | ICD-10-CM

## 2024-05-11 DIAGNOSIS — Z0000 Encounter for general adult medical examination without abnormal findings: Secondary | ICD-10-CM | POA: Diagnosis not present

## 2024-05-11 DIAGNOSIS — Z23 Encounter for immunization: Secondary | ICD-10-CM | POA: Diagnosis not present

## 2024-05-11 DIAGNOSIS — N509 Disorder of male genital organs, unspecified: Secondary | ICD-10-CM

## 2024-05-11 DIAGNOSIS — E782 Mixed hyperlipidemia: Secondary | ICD-10-CM | POA: Diagnosis not present

## 2024-05-11 DIAGNOSIS — R7303 Prediabetes: Secondary | ICD-10-CM

## 2024-05-11 DIAGNOSIS — Z1159 Encounter for screening for other viral diseases: Secondary | ICD-10-CM

## 2024-05-11 DIAGNOSIS — H9313 Tinnitus, bilateral: Secondary | ICD-10-CM

## 2024-05-11 DIAGNOSIS — M1A9XX0 Chronic gout, unspecified, without tophus (tophi): Secondary | ICD-10-CM

## 2024-05-11 DIAGNOSIS — Z0001 Encounter for general adult medical examination with abnormal findings: Secondary | ICD-10-CM

## 2024-05-11 DIAGNOSIS — E781 Pure hyperglyceridemia: Secondary | ICD-10-CM

## 2024-05-11 LAB — LIPID PANEL
Cholesterol: 129 mg/dL (ref 0–200)
HDL: 31.3 mg/dL — ABNORMAL LOW (ref >39.00)
LDL Cholesterol: 66 mg/dL (ref 0–99)
NonHDL: 97.91
Total CHOL/HDL Ratio: 4
Triglycerides: 159 mg/dL — ABNORMAL HIGH (ref 0.0–149.0)
VLDL: 31.8 mg/dL (ref 0.0–40.0)

## 2024-05-11 LAB — COMPREHENSIVE METABOLIC PANEL WITH GFR
ALT: 22 U/L (ref 0–53)
AST: 18 U/L (ref 0–37)
Albumin: 4.8 g/dL (ref 3.5–5.2)
Alkaline Phosphatase: 44 U/L (ref 39–117)
BUN: 10 mg/dL (ref 6–23)
CO2: 26 meq/L (ref 19–32)
Calcium: 9.5 mg/dL (ref 8.4–10.5)
Chloride: 103 meq/L (ref 96–112)
Creatinine, Ser: 0.81 mg/dL (ref 0.40–1.50)
GFR: 110.24 mL/min (ref >60.00)
Glucose, Bld: 90 mg/dL (ref 70–99)
Potassium: 4 meq/L (ref 3.5–5.1)
Sodium: 137 meq/L (ref 135–145)
Total Bilirubin: 0.4 mg/dL (ref 0.2–1.2)
Total Protein: 7.8 g/dL (ref 6.0–8.3)

## 2024-05-11 LAB — CBC WITH DIFFERENTIAL/PLATELET
Basophils Absolute: 0.1 K/uL (ref 0.0–0.1)
Basophils Relative: 1.2 % (ref 0.0–3.0)
Eosinophils Absolute: 0.2 K/uL (ref 0.0–0.7)
Eosinophils Relative: 3.1 % (ref 0.0–5.0)
HCT: 43.2 % (ref 39.0–52.0)
Hemoglobin: 15.1 g/dL (ref 13.0–17.0)
Lymphocytes Relative: 33.7 % (ref 12.0–46.0)
Lymphs Abs: 2 K/uL (ref 0.7–4.0)
MCHC: 34.9 g/dL (ref 30.0–36.0)
MCV: 86.8 fl (ref 78.0–100.0)
Monocytes Absolute: 0.5 K/uL (ref 0.1–1.0)
Monocytes Relative: 8.7 % (ref 3.0–12.0)
Neutro Abs: 3.2 K/uL (ref 1.4–7.7)
Neutrophils Relative %: 53.3 % (ref 43.0–77.0)
Platelets: 339 K/uL (ref 150.0–400.0)
RBC: 4.98 Mil/uL (ref 4.22–5.81)
RDW: 12.7 % (ref 11.5–15.5)
WBC: 5.9 K/uL (ref 4.0–10.5)

## 2024-05-11 LAB — TSH: TSH: 2.73 u[IU]/mL (ref 0.35–5.50)

## 2024-05-11 LAB — HEMOGLOBIN A1C: Hgb A1c MFr Bld: 5.5 % (ref 4.6–6.5)

## 2024-05-11 LAB — URIC ACID: Uric Acid, Serum: 7.4 mg/dL (ref 4.0–7.8)

## 2024-05-11 NOTE — Progress Notes (Signed)
 Complete physical exam  Patient: Michael Hart   DOB: October 11, 1985   38 y.o. Male  MRN: [id number]  Subjective:    No chief complaint on file.  He is here for a complete physical exam and chronic health conditions.   Discussed the use of AI scribe software for clinical note transcription with the patient, who gave verbal consent to proceed.  History of Present Illness Michael Hart is a 38 year old male who presents for an annual physical exam and preventive healthcare.  Dyslipidemia and prediabetes - Hyperlipidemia with last LDL 71 mg/dL and elevated triglycerides - Takes Vascepa , two capsules daily prescribed by cardiology  - Prediabetes with A1c recheck due - No current symptoms of diabetes  Scrotal lesion - Spot on scrotum present for three to four weeks - Initially tender, now non-painful - Monitoring for changes  Obstructive sleep apnea - Diagnosed with sleep apnea, mild - Does not use CPAP therapy  Tinnitus - Persistent tinnitus - No prior ENT evaluation  Exercise and gout - Preparing for a 5K turkey trot and walks regularly - No leg or foot swelling - No recent gout flares  Dietary modification and bowel habits - Reduced red meat intake - Increased dietary fiber - Improved bowel regularity    Health Maintenance  Topic Date Due   Hepatitis C Screening  Never done   Pneumococcal Vaccine (1 of 2 - PCV) Never done   Hepatitis B Vaccine (1 of 3 - 19+ 3-dose series) Never done   HPV Vaccine (1 - 3-dose SCDM series) Never done   COVID-19 Vaccine (5 - Moderna risk 2025-26 season) 09/04/2024   DTaP/Tdap/Td vaccine (5 - Td or Tdap) 05/11/2034   Flu Shot  Completed   HIV Screening  Completed   Meningitis B Vaccine  Aged Out    Wears seatbelt always, uses sunscreen, smoke detectors in home and functioning, does not text while driving, feels safe in home environment.  Depression screening:    09/23/2022    3:33 PM 05/26/2022    8:18 AM  04/16/2022    8:17 AM  Depression screen PHQ 2/9  Decreased Interest 0 0 1  Down, Depressed, Hopeless 0 0 1  PHQ - 2 Score 0 0 2  Altered sleeping   2  Tired, decreased energy   2  Change in appetite   0  Feeling bad or failure about yourself    1  Trouble concentrating   1  Moving slowly or fidgety/restless   0  Suicidal thoughts   0  PHQ-9 Score   8   Difficult doing work/chores   Not difficult at all     Data saved with a previous flowsheet row definition   Anxiety Screening:     No data to display           Patient Care Team: Lendia Boby CROME, NP-C as PCP - General (Family Medicine) Elmira Newman PARAS, MD as PCP - Cardiology (Cardiology)   Outpatient Medications Prior to Visit  Medication Sig Note   allopurinol  (ZYLOPRIM ) 100 MG tablet Take 2 tablets (200 mg total) by mouth daily. (Patient taking differently: Take 200 mg by mouth daily as needed.)    aspirin  EC 81 MG tablet Take 1 tablet (81 mg total) by mouth daily. Swallow whole.    cetirizine  (ZYRTEC ) 10 MG tablet Take 1 tablet (10 mg total) by mouth daily as needed for allergies.    clopidogrel  (PLAVIX ) 75 MG tablet Take 1  tablet (75 mg total) by mouth daily.    colchicine  0.6 MG tablet Take 1 tablet (0.6 mg total) by mouth 2 (two) times daily. Take at onset of foot or ankle pain.    icosapent  Ethyl (VASCEPA ) 1 g capsule Take 2 capsules (2 g total) by mouth 2 (two) times daily.    metoprolol  tartrate (LOPRESSOR ) 50 MG tablet Take 1 tablet (50 mg) by mouth 2 times daily.    nitroGLYCERIN  (NITROSTAT ) 0.4 MG SL tablet Place 1 tablet (0.4 mg total) under the tongue every 5 (five) minutes as needed for chest pain.    rosuvastatin  (CRESTOR ) 20 MG tablet Take 1 tablet (20 mg total) by mouth daily. NEEDS APPOINTMENT FOR REFILLS    [DISCONTINUED] losartan  (COZAAR ) 50 MG tablet Take 1 tablet (50 mg total) by mouth every evening. (Patient not taking: Reported on 05/11/2024) 05/11/2024: DC'ed by cardiology   No  facility-administered medications prior to visit.    Review of Systems  Constitutional:  Negative for chills, fever, malaise/fatigue and weight loss.  HENT:  Positive for tinnitus. Negative for congestion, ear pain, sinus pain and sore throat.   Eyes:  Negative for blurred vision, double vision and pain.  Respiratory:  Negative for cough, shortness of breath and wheezing.   Cardiovascular:  Negative for chest pain, palpitations and leg swelling.  Gastrointestinal:  Negative for abdominal pain, constipation, diarrhea, nausea and vomiting.  Genitourinary:  Negative for dysuria, frequency and urgency.  Musculoskeletal:  Negative for back pain, joint pain and myalgias.  Skin:  Negative for rash.       Bump on testicle that has healed   Neurological:  Negative for dizziness, tingling, focal weakness and headaches.  Psychiatric/Behavioral:  Negative for depression. The patient is not nervous/anxious.        Objective:    BP 122/84 (BP Location: Right Arm, Patient Position: Sitting, Cuff Size: Normal)   Pulse 66   Temp 98.5 F (36.9 C) (Oral)   Ht 5' 9.5 (1.765 m)   Wt 250 lb (113.4 kg)   SpO2 98%   BMI 36.39 kg/m  BP Readings from Last 3 Encounters:  05/11/24 122/84  09/06/23 122/82  03/25/23 134/78   Wt Readings from Last 3 Encounters:  05/11/24 250 lb (113.4 kg)  09/06/23 246 lb 6.4 oz (111.8 kg)  03/25/23 233 lb (105.7 kg)    Physical Exam Exam conducted with a chaperone present.  Constitutional:      General: He is not in acute distress.    Appearance: He is not ill-appearing.  HENT:     Right Ear: Tympanic membrane, ear canal and external ear normal.     Left Ear: Tympanic membrane, ear canal and external ear normal.     Nose: Nose normal.     Mouth/Throat:     Mouth: Mucous membranes are moist.     Pharynx: Oropharynx is clear.  Eyes:     Extraocular Movements: Extraocular movements intact.     Conjunctiva/sclera: Conjunctivae normal.     Pupils: Pupils are  equal, round, and reactive to light.  Neck:     Thyroid : No thyroid  mass, thyromegaly or thyroid  tenderness.  Cardiovascular:     Rate and Rhythm: Normal rate and regular rhythm.     Pulses: Normal pulses.     Heart sounds: Normal heart sounds.  Pulmonary:     Effort: Pulmonary effort is normal.     Breath sounds: Normal breath sounds.  Abdominal:     General: Bowel sounds  are normal. There is no distension.     Palpations: Abdomen is soft.     Tenderness: There is no abdominal tenderness. There is no right CVA tenderness, left CVA tenderness, guarding or rebound.  Genitourinary:    Comments: Pinpoint non tender, healed bump on inferior scrotum Musculoskeletal:        General: Normal range of motion.     Cervical back: Normal range of motion and neck supple. No tenderness.     Right lower leg: No edema.     Left lower leg: No edema.  Lymphadenopathy:     Cervical: No cervical adenopathy.  Skin:    General: Skin is warm and dry.     Findings: No lesion or rash.  Neurological:     General: No focal deficit present.     Mental Status: He is alert and oriented to person, place, and time.     Cranial Nerves: No cranial nerve deficit.     Sensory: No sensory deficit.     Motor: No weakness.  Psychiatric:        Mood and Affect: Mood normal.        Behavior: Behavior normal.        Thought Content: Thought content normal.      Results for orders placed or performed in visit on 05/11/24  Uric acid  Result Value Ref Range   Uric Acid, Serum 7.4 4.0 - 7.8 mg/dL  TSH  Result Value Ref Range   TSH 2.73 0.35 - 5.50 uIU/mL  Lipid panel  Result Value Ref Range   Cholesterol 129 0 - 200 mg/dL   Triglycerides 840.9 (H) 0.0 - 149.0 mg/dL   HDL 68.69 (L) >60.99 mg/dL   VLDL 68.1 0.0 - 59.9 mg/dL   LDL Cholesterol 66 0 - 99 mg/dL   Total CHOL/HDL Ratio 4    NonHDL 97.91   Hemoglobin A1c  Result Value Ref Range   Hgb A1c MFr Bld 5.5 4.6 - 6.5 %  Comprehensive metabolic panel with  GFR  Result Value Ref Range   Sodium 137 135 - 145 mEq/L   Potassium 4.0 3.5 - 5.1 mEq/L   Chloride 103 96 - 112 mEq/L   CO2 26 19 - 32 mEq/L   Glucose, Bld 90 70 - 99 mg/dL   BUN 10 6 - 23 mg/dL   Creatinine, Ser 9.18 0.40 - 1.50 mg/dL   Total Bilirubin 0.4 0.2 - 1.2 mg/dL   Alkaline Phosphatase 44 39 - 117 U/L   AST 18 0 - 37 U/L   ALT 22 0 - 53 U/L   Total Protein 7.8 6.0 - 8.3 g/dL   Albumin  4.8 3.5 - 5.2 g/dL   GFR 889.75 >39.99 mL/min   Calcium  9.5 8.4 - 10.5 mg/dL  CBC with Differential/Platelet  Result Value Ref Range   WBC 5.9 4.0 - 10.5 K/uL   RBC 4.98 4.22 - 5.81 Mil/uL   Hemoglobin 15.1 13.0 - 17.0 g/dL   HCT 56.7 60.9 - 47.9 %   MCV 86.8 78.0 - 100.0 fl   MCHC 34.9 30.0 - 36.0 g/dL   RDW 87.2 88.4 - 84.4 %   Platelets 339.0 150.0 - 400.0 K/uL   Neutrophils Relative % 53.3 43.0 - 77.0 %   Lymphocytes Relative 33.7 12.0 - 46.0 %   Monocytes Relative 8.7 3.0 - 12.0 %   Eosinophils Relative 3.1 0.0 - 5.0 %   Basophils Relative 1.2 0.0 - 3.0 %   Neutro Abs  3.2 1.4 - 7.7 K/uL   Lymphs Abs 2.0 0.7 - 4.0 K/uL   Monocytes Absolute 0.5 0.1 - 1.0 K/uL   Eosinophils Absolute 0.2 0.0 - 0.7 K/uL   Basophils Absolute 0.1 0.0 - 0.1 K/uL      Assessment & Plan:    Routine Health Maintenance and Physical Exam Problem List Items Addressed This Visit     Chronic gout without tophus   Relevant Orders   Uric acid (Completed)   Coronary artery disease   Relevant Orders   Lipid panel (Completed)   Hypertriglyceridemia   Relevant Orders   Lipid panel (Completed)   Mixed hyperlipidemia   Relevant Orders   Lipid panel (Completed)   Obesity (BMI 30-39.9)   Relevant Orders   TSH (Completed)   Prediabetes   Relevant Orders   CBC with Differential/Platelet (Completed)   Comprehensive metabolic panel with GFR (Completed)   Hemoglobin A1c (Completed)   TSH (Completed)   Primary hypertension   Relevant Orders   CBC with Differential/Platelet (Completed)   Comprehensive  metabolic panel with GFR (Completed)   TSH (Completed)   Other Visit Diagnoses       Encounter for general adult medical examination with abnormal findings    -  Primary     Scrotal skin lesion         Tinnitus of both ears         Encounter for screening for other viral diseases       Relevant Orders   Hepatitis C antibody     Immunization due       Relevant Orders   Tdap vaccine greater than or equal to 7yo IM (Completed)       Assessment and Plan Assessment & Plan Adult Wellness Visit Annual physical exam conducted. Blood pressure is well-controlled. No changes in bowel habits or family history of colon cancer. No need for colonoscopy at this time. Dental check-up completed this year. - Continue routine health maintenance and screenings  Atherosclerotic heart disease of native coronary artery without angina No current angina symptoms. Cardiologist follow-up is necessary for ongoing management. - Schedule follow-up appointment with cardiologist  Hypertriglyceridemia and mixed hyperlipidemia Previously elevated triglycerides. Currently on Vascepa . Last cholesterol check in July showed LDL at 71. Liver function tests monitored due to statin use. - Ordered fasting lipid panel to assess current triglyceride levels  Obesity (BMI 30-39.9) BMI is 36. Discussed potential for GLP-1 therapy. Discussed side effects including nausea, bloating, and constipation. Emphasized importance of hydration and nutrition. - Follow up with cardiologist regarding GLP-1 agonist therapy approval  Prediabetes Previous A1c in prediabetes range. Monitoring required to assess current status. - Ordered A1c test to evaluate current glycemic control  Essential hypertension Blood pressure is well-controlled with current medication regimen. - Continue current antihypertensive medications  Chronic gout without tophus No recent gout flares. Dietary modifications include reduced red meat and alcohol  intake. - Ordered uric acid level to monitor gout status  Tinnitus Chronic tinnitus with no acute changes. No ENT referral desired at this time. - Advised on ear protection and use of white noise for sleep  History of sleep apnea Mild sleep apnea with no current CPAP use. No severe symptoms reported. - Continue monitoring symptoms and consider CPAP if symptoms worsen  Resolved scrotal skin lesion Previously tender scrotal lesion, now resolved. Possible ingrown hair. No signs of infection. - Advised to monitor for recurrence or changes in size - Will consider ultrasound if lesion enlarges or becomes  tender     Return in about 6 months (around 11/08/2024).     Boby Mackintosh, NP-C

## 2024-05-11 NOTE — Patient Instructions (Addendum)
 Please go downstairs for labs before you leave.  I will be in touch with your results and with recommendations.  Remember to call and schedule your follow-up cardiology appointment  My records indicate that you may be overdue for your Tdap (tetanus, diphtheria, and pertussis) vaccine.  If you have not had 1 of these in the past 10 years, you should update this.

## 2024-05-13 LAB — HEPATITIS C ANTIBODY: Hepatitis C Ab: NONREACTIVE

## 2024-05-15 ENCOUNTER — Other Ambulatory Visit (HOSPITAL_COMMUNITY): Payer: Self-pay

## 2024-05-15 ENCOUNTER — Other Ambulatory Visit: Payer: Self-pay

## 2024-05-15 ENCOUNTER — Ambulatory Visit: Payer: Self-pay | Admitting: Family Medicine

## 2024-05-15 ENCOUNTER — Other Ambulatory Visit: Payer: Self-pay | Admitting: Family Medicine

## 2024-05-15 MED ORDER — ROSUVASTATIN CALCIUM 20 MG PO TABS
20.0000 mg | ORAL_TABLET | Freq: Every day | ORAL | 1 refills | Status: DC
  Filled 2024-05-15: qty 90, 90d supply, fill #0

## 2024-05-20 ENCOUNTER — Other Ambulatory Visit (HOSPITAL_COMMUNITY): Payer: Self-pay

## 2024-06-16 ENCOUNTER — Other Ambulatory Visit (HOSPITAL_COMMUNITY): Payer: Self-pay

## 2024-06-28 ENCOUNTER — Encounter: Payer: Self-pay | Admitting: Family Medicine

## 2024-06-28 ENCOUNTER — Ambulatory Visit: Admitting: Family Medicine

## 2024-06-28 ENCOUNTER — Other Ambulatory Visit (HOSPITAL_COMMUNITY): Payer: Self-pay

## 2024-06-28 VITALS — BP 122/86 | HR 92 | Temp 97.8°F | Ht 69.5 in | Wt 249.0 lb

## 2024-06-28 DIAGNOSIS — F419 Anxiety disorder, unspecified: Secondary | ICD-10-CM | POA: Diagnosis not present

## 2024-06-28 MED ORDER — SERTRALINE HCL 25 MG PO TABS
25.0000 mg | ORAL_TABLET | Freq: Every day | ORAL | 1 refills | Status: AC
  Filled 2024-06-28: qty 30, 30d supply, fill #0

## 2024-06-28 MED ORDER — ALPRAZOLAM 0.5 MG PO TABS
0.2500 mg | ORAL_TABLET | Freq: Every day | ORAL | 0 refills | Status: AC | PRN
  Filled 2024-06-28: qty 30, 30d supply, fill #0

## 2024-06-28 NOTE — Progress Notes (Signed)
 "  Subjective:     Patient ID: Michael Hart, male    DOB: Oct 22, 1985, 39 y.o.   MRN: [id number]  Chief Complaint  Patient presents with   Anxiety    Having anxiety for last 2.5 months. Getting panic attacks pretty much daily.     Anxiety Symptoms include nervous/anxious behavior. Patient reports no chest pain, dizziness, nausea, palpitations, shortness of breath or suicidal ideas.      Discussed the use of AI scribe software for clinical note transcription with the patient, who gave verbal consent to proceed.  History of Present Illness Michael Hart is a 39 year old male with anxiety who presents with increased anxiety and panic attacks.  Anxiety and panic symptoms - Notable increase in anxiety and panic attacks over the past few weeks - Previously trialed Effexor  without benefit - No use of other medications for anxiety or depression - No thoughts of self-harm - No alcohol or drug use for coping - Follow-up scheduled with psychiatrist and therapist  Psychosocial stressors - Significant stress from ongoing custody issues - Resurfacing distress related to past marriage issues  Cardiac history - History of heart issues relevant to evaluation and management of anxiety and panic symptoms         06/28/2024    3:02 PM 09/23/2022    3:33 PM 05/26/2022    8:18 AM 04/16/2022    8:17 AM 01/28/2022    8:50 AM  Depression screen PHQ 2/9  Decreased Interest 1 0 0 1 0  Down, Depressed, Hopeless 1 0 0 1 0  PHQ - 2 Score 2 0 0 2 0  Altered sleeping 3   2   Tired, decreased energy 3   2   Change in appetite 0   0   Feeling bad or failure about yourself  1   1   Trouble concentrating 0   1   Moving slowly or fidgety/restless 0   0   Suicidal thoughts 0   0   PHQ-9 Score 9   8    Difficult doing work/chores    Not difficult at all      Data saved with a previous flowsheet row definition      06/28/2024    3:01 PM  GAD 7 : Generalized Anxiety Score  Nervous,  Anxious, on Edge 3  Control/stop worrying 2  Worry too much - different things 3  Trouble relaxing 3  Restless 0  Easily annoyed or irritable 3  Afraid - awful might happen 3  Total GAD 7 Score 17  Anxiety Difficulty Very difficult       Health Maintenance Due  Topic Date Due   Pneumococcal Vaccine (1 of 2 - PCV) Never done   Hepatitis B Vaccines 19-59 Average Risk (1 of 3 - 19+ 3-dose series) Never done   HPV VACCINES (1 - 3-dose SCDM series) Never done    Past Medical History:  Diagnosis Date   Allergy    Asthma    Coronary artery disease    Gout    Kidney stone    Panic attacks    nocturnal, controls with self directed biofeedback   Primary hypertension 03/03/2022   S/P CABG x 2 03/27/2022: LIMA-RI, free RIMA-dRCA     Past Surgical History:  Procedure Laterality Date   CARDIAC CATHETERIZATION     CORONARY ARTERY BYPASS GRAFT N/A 03/27/2022   Procedure: CORONARY ARTERY BYPASS GRAFTING (CABG) x2 , USING BILATERAL INTERNAL MAMMARY ARTERIES;  Surgeon: Kerrin Elspeth BROCKS, MD;  Location: [facility] OR;  Service: Open Heart Surgery;  Laterality: N/A;   CORONARY/GRAFT ACUTE MI REVASCULARIZATION N/A 03/21/2022   Procedure: Coronary/Graft Acute MI Revascularization;  Surgeon: Elmira Newman PARAS, MD;  Location: [facility] IN[facility];  Service: Cardiovascular;  Laterality: N/A;   CYSTOSCOPY N/A 03/27/2022   Procedure: CYSTOSCOPY FLEXIBLE, PLACEMENT OF FOLEY CATHETER;  Surgeon: Selma Donnice SAUNDERS, MD;  Location: [facility] OR;  Service: Urology;  Laterality: N/A;   NO PAST SURGERIES     RADIAL ARTERY HARVEST Left 03/27/2022   Procedure: ATTEMPTED RADIAL ARTERY HARVEST;  Surgeon: Kerrin Elspeth BROCKS, MD;  Location: [facility] OR;  Service: Open Heart Surgery;  Laterality: Left;   TEE WITHOUT CARDIOVERSION N/A 03/27/2022   Procedure: TRANSESOPHAGEAL ECHOCARDIOGRAM (TEE);  Surgeon: Kerrin Elspeth BROCKS, MD;  Location: [facility] OR;  Service: Open Heart Surgery;  Laterality: N/A;    Family History  Problem  Relation Age of Onset   Breast cancer Mother    Lung cancer Father    Heart disease Father        CABG   COPD Father    Hypertension Other    Stroke Other    Migraines Neg Hx    Headache Neg Hx    Sleep apnea Neg Hx     Social History   Socioeconomic History   Marital status: Divorced    Spouse name: Not on file   Number of children: 2   Years of education: 16   Highest education level: Bachelor's degree (e.g., BA, AB, BS)  Occupational History   Not on file  Tobacco Use   Smoking status: Former    Current packs/day: 0.00    Average packs/day: 0.5 packs/day for 12.0 years (6.0 ttl pk-yrs)    Types: Cigarettes    Start date: 2001    Quit date: 2013    Years since quitting: 13.0   Smokeless tobacco: Never  Vaping Use   Vaping status: Never Used  Substance and Sexual Activity   Alcohol use: Not Currently    Alcohol/week: 2.0 standard drinks of alcohol    Types: 2 Glasses of wine per week    Comment: occasional   Drug use: No   Sexual activity: Not on file  Other Topics Concern   Not on file  Social History Narrative   Not on file   Social Drivers of Health   Tobacco Use: Medium Risk (06/28/2024)   Patient History    Smoking Tobacco Use: Former    Smokeless Tobacco Use: Never    Passive Exposure: Not on file  Financial Resource Strain: Medium Risk (09/23/2022)   Overall Financial Resource Strain (CARDIA)    Difficulty of Paying Living Expenses: Somewhat hard  Food Insecurity: No Food Insecurity (09/23/2022)   Hunger Vital Sign    Worried About Running Out of Food in the Last Year: Never true    Ran Out of Food in the Last Year: Never true  Transportation Needs: No Transportation Needs (09/23/2022)   PRAPARE - Administrator, Civil Service (Medical): No    Lack of Transportation (Non-Medical): No  Physical Activity: Insufficiently Active (09/23/2022)   Exercise Vital Sign    Days of Exercise per Week: 3 days    Minutes of Exercise per Session: 20 min   Stress: Stress Concern Present (09/23/2022)   [employer] of Occupational Health - Occupational Stress Questionnaire    Feeling of Stress : To some extent  Social Connections: Moderately Isolated (09/23/2022)  Social Advertising Account Executive    Frequency of Communication with Friends and Family: More than three times a week    Frequency of Social Gatherings with Friends and Family: Twice a week    Attends Religious Services: 1 to 4 times per year    Active Member of [employer] or Organizations: No    Attends Engineer, Structural: Not on file    Marital Status: Divorced  Intimate Partner Violence: Not At Risk (03/21/2022)   Humiliation, Afraid, Rape, and Kick questionnaire    Fear of Current or Ex-Partner: No    Emotionally Abused: No    Physically Abused: No    Sexually Abused: No  Depression (PHQ2-9): Medium Risk (06/28/2024)   Depression (PHQ2-9)    PHQ-2 Score: 9  Alcohol Screen: Low Risk (09/23/2022)   Alcohol Screen    Last Alcohol Screening Score (AUDIT): 3  Housing: Low Risk (09/23/2022)   Housing    Last Housing Risk Score: 0  Utilities: Not At Risk (03/21/2022)   AHC Utilities    Threatened with loss of utilities: No  Health Literacy: Not on file    Outpatient Medications Prior to Visit  Medication Sig Dispense Refill   allopurinol  (ZYLOPRIM ) 100 MG tablet Take 2 tablets (200 mg total) by mouth daily. (Patient taking differently: Take 200 mg by mouth daily as needed.) 60 tablet 2   aspirin  EC 81 MG tablet Take 1 tablet (81 mg total) by mouth daily. Swallow whole. 90 tablet 1   cetirizine  (ZYRTEC ) 10 MG tablet Take 1 tablet (10 mg total) by mouth daily as needed for allergies. 90 tablet 1   clopidogrel  (PLAVIX ) 75 MG tablet Take 1 tablet (75 mg total) by mouth daily. 90 tablet 3   colchicine  0.6 MG tablet Take 1 tablet (0.6 mg total) by mouth 2 (two) times daily. Take at onset of foot or ankle pain. 30 tablet 0   icosapent  Ethyl (VASCEPA ) 1 g capsule Take 2  capsules (2 g total) by mouth 2 (two) times daily. 120 capsule 6   metoprolol  tartrate (LOPRESSOR ) 50 MG tablet Take 1 tablet (50 mg) by mouth 2 times daily. 180 tablet 3   nitroGLYCERIN  (NITROSTAT ) 0.4 MG SL tablet Place 1 tablet (0.4 mg total) under the tongue every 5 (five) minutes as needed for chest pain. 75 tablet 3   rosuvastatin  (CRESTOR ) 20 MG tablet Take 1 tablet (20 mg total) by mouth daily. 90 tablet 1   No facility-administered medications prior to visit.    Allergies[1]  Review of Systems  Constitutional:  Negative for chills and fever.  Respiratory:  Negative for shortness of breath.   Cardiovascular:  Negative for chest pain, palpitations and leg swelling.  Gastrointestinal:  Negative for abdominal pain, constipation, diarrhea, nausea and vomiting.  Genitourinary:  Negative for dysuria, frequency and urgency.  Neurological:  Negative for dizziness, focal weakness and headaches.  Psychiatric/Behavioral:  Positive for depression. Negative for substance abuse and suicidal ideas. The patient is nervous/anxious.        Objective:    Physical Exam Constitutional:      General: He is not in acute distress.    Appearance: He is not ill-appearing.  Eyes:     Extraocular Movements: Extraocular movements intact.     Conjunctiva/sclera: Conjunctivae normal.  Cardiovascular:     Rate and Rhythm: Normal rate.  Pulmonary:     Effort: Pulmonary effort is normal.  Musculoskeletal:     Cervical back: Normal range of motion and  neck supple.  Skin:    General: Skin is warm and dry.  Neurological:     General: No focal deficit present.     Mental Status: He is alert and oriented to person, place, and time.  Psychiatric:        Mood and Affect: Mood normal.        Behavior: Behavior normal.        Thought Content: Thought content normal.      BP 122/86   Pulse 92   Temp 97.8 F (36.6 C) (Temporal)   Ht 5' 9.5 (1.765 m)   Wt 249 lb (112.9 kg)   SpO2 98%   BMI 36.24  kg/m  Wt Readings from Last 3 Encounters:  06/28/24 249 lb (112.9 kg)  05/11/24 250 lb (113.4 kg)  09/06/23 246 lb 6.4 oz (111.8 kg)       Assessment & Plan:   Problem List Items Addressed This Visit   None Visit Diagnoses       Anxiety disorder with panic attacks    -  Primary   Relevant Medications   sertraline  (ZOLOFT ) 25 MG tablet   ALPRAZolam  (XANAX ) 0.5 MG tablet       Assessment and Plan Assessment & Plan Anxiety disorder with panic attacks Increased anxiety and panic attacks exacerbated by recent stressors including custody issues and personal losses. No current risk of self-harm or harm to others. No history of self-medication with alcohol or drugs. Previous trial of Effexor  was not well-tolerated. No prior use of SSRIs or benzodiazepines for anxiety. - Prescribed sertraline  (Zoloft ) to be taken at bedtime. Discussed potential benefits of SSRIs in managing anxiety and mood, with effects expected in 2-4 weeks. - Prescribed alprazolam  (Xanax ) for as-needed use, with instructions to take half a pill initially - Continue with scheduled appointments with psychiatry and therapy for ongoing management and support.     I am having Elsie DEL. Demilio Hense start on sertraline  and ALPRAZolam . I am also having him maintain his cetirizine , colchicine , allopurinol , nitroGLYCERIN , aspirin  EC, clopidogrel , metoprolol  tartrate, icosapent  Ethyl, and rosuvastatin .  Meds ordered this encounter  Medications   sertraline  (ZOLOFT ) 25 MG tablet    Sig: Take 1 tablet (25 mg total) by mouth daily.    Dispense:  30 tablet    Refill:  1    Supervising Provider:   ROLLENE NORRIS A [[id number]]   ALPRAZolam  (XANAX ) 0.5 MG tablet    Sig: Take 0.5-1 tablets (0.25-0.5 mg total) by mouth daily as needed for anxiety.    Dispense:  30 tablet    Refill:  0    Supervising Provider:   ROLLENE NORRIS A [[id number]]       [1]  Allergies Allergen Reactions   Penicillins Shortness Of Breath,  Swelling and Other (See Comments)    Childhood reaction   Sulfa Antibiotics Shortness Of Breath, Swelling and Other (See Comments)    Childhood reaction   "

## 2024-07-06 ENCOUNTER — Other Ambulatory Visit: Payer: Self-pay

## 2024-07-06 ENCOUNTER — Other Ambulatory Visit (HOSPITAL_COMMUNITY): Payer: Self-pay

## 2024-07-06 MED ORDER — SERTRALINE HCL 50 MG PO TABS
50.0000 mg | ORAL_TABLET | Freq: Every day | ORAL | 1 refills | Status: AC
  Filled 2024-07-06: qty 30, 30d supply, fill #0

## 2024-07-12 ENCOUNTER — Other Ambulatory Visit (HOSPITAL_COMMUNITY): Payer: Self-pay

## 2024-07-25 ENCOUNTER — Other Ambulatory Visit (HOSPITAL_COMMUNITY): Payer: Self-pay

## 2024-07-25 ENCOUNTER — Ambulatory Visit: Payer: Self-pay | Admitting: Cardiology

## 2024-07-25 ENCOUNTER — Other Ambulatory Visit: Payer: Self-pay

## 2024-07-25 VITALS — BP 98/50 | HR 92 | Ht 70.0 in | Wt 243.8 lb

## 2024-07-25 DIAGNOSIS — Z6834 Body mass index (BMI) 34.0-34.9, adult: Secondary | ICD-10-CM | POA: Diagnosis not present

## 2024-07-25 DIAGNOSIS — E782 Mixed hyperlipidemia: Secondary | ICD-10-CM | POA: Diagnosis not present

## 2024-07-25 DIAGNOSIS — E6609 Other obesity due to excess calories: Secondary | ICD-10-CM

## 2024-07-25 DIAGNOSIS — E66811 Obesity, class 1: Secondary | ICD-10-CM

## 2024-07-25 DIAGNOSIS — I2510 Atherosclerotic heart disease of native coronary artery without angina pectoris: Secondary | ICD-10-CM | POA: Diagnosis not present

## 2024-07-25 MED ORDER — ROSUVASTATIN CALCIUM 20 MG PO TABS
20.0000 mg | ORAL_TABLET | Freq: Every day | ORAL | 3 refills | Status: AC
  Filled 2024-07-25: qty 90, 90d supply, fill #0

## 2024-07-25 MED ORDER — METOPROLOL TARTRATE 50 MG PO TABS
50.0000 mg | ORAL_TABLET | Freq: Two times a day (BID) | ORAL | 3 refills | Status: AC
  Filled 2024-07-25: qty 180, 90d supply, fill #0

## 2024-07-25 MED ORDER — ICOSAPENT ETHYL 1 G PO CAPS
2.0000 g | ORAL_CAPSULE | Freq: Two times a day (BID) | ORAL | 3 refills | Status: AC
  Filled 2024-07-25: qty 360, 90d supply, fill #0

## 2024-07-25 NOTE — Progress Notes (Signed)
 " Cardiology Office Note:  .   Date:  07/25/2024  ID:  Michael Hart, DOB [birth year], MRN [id number] PCP: Lendia Boby CROME, NP-C  [facility] Providers Cardiologist:  Newman Lawrence, MD PCP: Lendia Boby CROME, NP-C  Chief Complaint  Patient presents with   Coronary Artery Disease     Michael Hart is a 39 y.o. male with hypertension, hyperlipidemia, CAD s/p CABG X 2 (LIMA-ramus, free RIMA-dRCA) 2023    Discussed the use of AI scribe software for clinical note transcription with the patient, who gave verbal consent to proceed.  History of Present Illness Michael Hart is a 39 year old male with coronary artery disease who presents for a follow-up visit.  He developed recurrent hives in the same areas after his hospitalization for heart attack and suspects a medication reaction. He takes daily Zyrtec , and the hives recur if he misses a dose.  He is doing regular weightlifting and aerobic exercise, including biking, without chest pain, shortness of breath, or diaphoresis.  He takes Vascepa  with improvement in cholesterol and triglycerides and has reduced red meat and increased fiber, though he still struggles with portion control.  He is currently taking Plavix , metoprolol , Crestor , and Vascepa .      There were no vitals filed for this visit.    Review of Systems  Cardiovascular:  Negative for chest pain, dyspnea on exertion, leg swelling, palpitations and syncope.        Studies Reviewed: .        Echocardiogram 10/2022: LVEF 55 to 60%  Zio patch 9 days 09/23/2022 - 10/02/2022: Dominant rhythm: Sinus. HR 57-144 bpm. Avg HR 82 bpm, in sinus rhythm. 0 episodes of SVT. <1% isolated SVE, couplets. 0 episodes of VT. <1% isolated VE, couplet. No atrial fibrillation/atrial flutter/SVT/VT/high grade AV block, sinus pause >3sec noted. 9 patient triggered events, correlated with sinus rhythm/VE.     Coronary intervention 2023: LM: Normal LAD:  Prox 100% occlusion          Mid 80-90% stenoses          Diffuse 80% diag disease Ramus: Large vessel. Prox 75% stenosis Lcx: Mid 95%, OM1 95% bifurcation stenosis RCA: Mid 80%, followed by 75% stenoses          Distal 80% stenosis   Successful percutaneous coronary intervention prox-mid LAD     Aspiration thrombectomy, PTCA, and overlapping stents placement     Synergy DES 4.0X12 mm, 3.5X48 mm, 3.0X16 mm     This was necessitated by flow limiting lesions beyond the culprit lesion     Post dilataation with 4.0X15 mm [place] balloon upto 18 atm  --->CABG X 2 (LIMA-ramus, free RIMA-dRCA)  Labs 04/2024: Chol 129, TG 159, HDL 31, LDL 66 HbA1C 5.5% Hb 15.1 Cr 0.81 TSH 2.7   Physical Exam Vitals and nursing note reviewed.  Constitutional:      General: He is not in acute distress. Neck:     Vascular: No JVD.  Cardiovascular:     Rate and Rhythm: Normal rate and regular rhythm.     Heart sounds: Normal heart sounds. No murmur heard. Pulmonary:     Effort: Pulmonary effort is normal.     Breath sounds: Normal breath sounds. No wheezing or rales.  Musculoskeletal:     Right lower leg: No edema.     Left lower leg: No edema.      VISIT DIAGNOSES:   ICD-10-CM   1. Class 1 obesity due to  excess calories without serious comorbidity with body mass index (BMI) of 34.0 to 34.9 in adult  E66.811 AMB Referral to [facility] Pharm[facility]   E66.09    Z68.34     2. Coronary artery disease involving native coronary artery of native heart without angina pectoris  I25.10 AMB Referral to [facility] Pharm-D    3. Mixed hyperlipidemia  E78.2        Michael Hart is a 39 y.o. male with hypertension, hyperlipidemia, CAD s/p CABG X 2 (LIMA-ramus, free RIMA-dRCA) 2023     Assessment & Plan Coronary artery disease: Well-managed, no recent chest pain or dyspnea. Blood pressure controlled. Aspirin  discontinued, Plavix  continued as sole antithrombotic. - Discontinued aspirin . - Continue Plavix . -  Provided one-year refill for cardiac medications including Plavix , Vascepa , metoprolol , and Crestor .  Premature ventricular contractions: - Continue metoprolol .  Mixed hyperlipidemia: Well-controlled with medications and dietary modifications. Improved cholesterol and triglyceride levels. - Continue current lipid-lowering therapy including Vascepa  and Crestor .  Obesity: Management prioritized due to coronary artery disease. Interested in pharmacological weight loss assistance. Insurance coverage for medications like Ozempic or Wegovy anticipated. - Referred to pharmacist to explore weight loss medications such as Ozempic or Wegovy.  Drug-induced urticaria: Intermittent urticaria likely a fixed drug reaction, controlled with daily Zyrtec . Exact medication causing reaction unclear. - Continue daily Zyrtec .    Meds ordered this encounter  Medications   icosapent  Ethyl (VASCEPA ) 1 g capsule    Sig: Take 2 capsules (2 g total) by mouth 2 (two) times daily.    Dispense:  360 capsule    Refill:  3   metoprolol  tartrate (LOPRESSOR ) 50 MG tablet    Sig: Take 1 tablet (50 mg) by mouth 2 times daily.    Dispense:  180 tablet    Refill:  3   rosuvastatin  (CRESTOR ) 20 MG tablet    Sig: Take 1 tablet (20 mg total) by mouth daily.    Dispense:  90 tablet    Refill:  3     F/u in 1 year  Signed, Newman JINNY Lawrence, MD  "

## 2024-07-26 ENCOUNTER — Ambulatory Visit: Payer: PRIVATE HEALTH INSURANCE | Admitting: Pharmacist Clinician (PhC)/ Clinical Pharmacy Specialist

## 2024-07-26 ENCOUNTER — Encounter: Payer: Self-pay | Admitting: Pharmacist Clinician (PhC)/ Clinical Pharmacy Specialist

## 2024-07-26 NOTE — Patient Instructions (Signed)
 We will start the prior authorization process to get Wegovy tablets covered by your insurance.   TIPS FOR SUCCESS Write down the reasons why you want to lose weight and post it in a place where you'll see it often. Start small and work your way up. Keep in mind that it takes time to achieve goals, and small steps add up. Any additional movements help to burn calories. Taking the stairs rather than the elevator and parking at the far end of your parking lot are easy ways to start. Brisk walking for at least 30 minutes 4 or more days of the week is an excellent goal to work toward  [employer] WHAT IT MEANS TO FEEL FULL Did you know that it can take 15 minutes or more for your brain to receive the message that you've eaten? This means that if you eat less food, but consume it slower, you may still feel satisfied. Eating a lot of fruits and vegetables can also help you feel fuller. Eat off of smaller plates so that moderate portions don't seem too small  TITRATION PLAN  You will start with the lowest dose and increase every 4 weeks (30 days for tablets) as tolerated.  If you feel you need another month on the same dose, that is perfectly fine.  GI side effects are the most problematic (nausea, vomiting).  When you get to the 4th injection (4th week of tablets) please send me a MyChart message or call [phone], and tell me the following information:  1.  Any change in weight (we don't always expect in the first month or two). 2.  Any side effects (mostly nausea/vomiting) 3.  Do you feel as though you are wanting less food? 4.  Are you continuing to work on exercise and lifestyle modifications. 5.  Are you ready to move up to the next dose, or would you like to  stay here for another 4 weeks. (We encourage staying at the same dose if nausea and/or vomiting were problematic)  I need to have this information to help determine whether to increase the dose or stay the same for another month.     If you have any questions or concerns, please reach out to me.  MyChart is the fastest way to find me, but you can also call me directly at [phone].  (Note: I am not always available to check phone messages daily)  THANK YOU FOR CHOOSING [facility]

## 2024-07-26 NOTE — Progress Notes (Unsigned)
 "    Office Visit    Patient Name: Michael Hart Date of Encounter: 07/26/2024  Primary Care Provider:  Lendia Boby CROME, NP-C Primary Cardiologist:  Newman JINNY Lawrence, MD  Chief Complaint    Weight management  Significant Past Medical History   ASCVD 9/23 - STEMI, CABG x 2  HTN   HFrEF On metoprolol   HLD 11/25 LDL 66, TG 159, on rosuvastatin  20, icosapent  ethyl 2 bid       Allergies[1]  History of Present Illness    Michael Hart is a 39 y.o. male patient of Dr Lawrence, in the office today to discuss options for weight management.    Goal around 200 lb  *** If diabetic and on insulin /sulfonylurea, can consider reducing dose to reduce risk of hypoglycemia  *** Follow-up visit  Assess % weight loss Assess adverse effects Missed doses  Current weight management medications:   Previously tried meds:   Current meds that may affect weight:   Baseline weight/BMI:   Insurance payor:   Diet: MWF regimented weight lifting routine (weighted qsquats) some cadio - 3 mile walks in warmer we[place] 2-3 time wper week  Exercise: 3 meals, struggles to isten to voice , has changed type, less red meat, fat, more fiber; lowest wt 224 after MI  Family History:   Confirmed patient not ***pregnant and no personal or family history of medullary thyroid  carcinoma (MTC) or Multiple Endocrine Neoplasia syndrome type 2 (MEN 2).   Social History:   Tobacco:  Alcohol:  Caffeine:   Adherence Assessment  Do you ever forget to take your medication? [] Yes [] No  Do you ever skip doses due to side effects? [] Yes [] No  Do you have trouble affording your medicines? [] Yes [] No  Are you ever unable to pick up your medication due to transportation difficulties? [] Yes [] No  Do you ever stop taking your medications because you don't believe they are helping? [] Yes [] No  Do you check your weight daily? [] Yes [] No   Adherence strategy: ***  Barriers to obtaining medications:  ***   Accessory Clinical Findings    Lab Results  Component Value Date   CREATININE 0.81 05/11/2024   BUN 10 05/11/2024   NA 137 05/11/2024   K 4.0 05/11/2024   CL 103 05/11/2024   CO2 26 05/11/2024   Lab Results  Component Value Date   ALT 22 05/11/2024   AST 18 05/11/2024   ALKPHOS 44 05/11/2024   BILITOT 0.4 05/11/2024   Lab Results  Component Value Date   HGBA1C 5.5 05/11/2024      Home Medications/Allergies    Current Outpatient Medications  Medication Sig Dispense Refill   allopurinol  (ZYLOPRIM ) 100 MG tablet Take 2 tablets (200 mg total) by mouth daily. (Patient not taking: Reported on 07/25/2024) 60 tablet 2   ALPRAZolam  (XANAX ) 0.5 MG tablet Take 0.5-1 tablets (0.25-0.5 mg total) by mouth daily as needed for anxiety. 30 tablet 0   cetirizine  (ZYRTEC ) 10 MG tablet Take 1 tablet (10 mg total) by mouth daily as needed for allergies. 90 tablet 1   clopidogrel  (PLAVIX ) 75 MG tablet Take 1 tablet (75 mg total) by mouth daily. 90 tablet 3   colchicine  0.6 MG tablet Take 1 tablet (0.6 mg total) by mouth 2 (two) times daily. Take at onset of foot or ankle pain. 30 tablet 0   icosapent  Ethyl (VASCEPA ) 1 g capsule Take 2 capsules (2 g total) by mouth 2 (two) times daily. 360 capsule 3  metoprolol  tartrate (LOPRESSOR ) 50 MG tablet Take 1 tablet (50 mg) by mouth 2 times daily. 180 tablet 3   nitroGLYCERIN  (NITROSTAT ) 0.4 MG SL tablet Place 1 tablet (0.4 mg total) under the tongue every 5 (five) minutes as needed for chest pain. 75 tablet 3   rosuvastatin  (CRESTOR ) 20 MG tablet Take 1 tablet (20 mg total) by mouth daily. 90 tablet 3   sertraline  (ZOLOFT ) 25 MG tablet Take 1 tablet (25 mg total) by mouth daily. 30 tablet 1   sertraline  (ZOLOFT ) 50 MG tablet Take 1 tablet (50 mg total) by mouth daily. 30 tablet 1   No current facility-administered medications for this visit.     Allergies[2]  Assessment & Plan    No problem-specific Assessment & Plan notes found for this  encounter.   Allean Mink PharmD CPP [facility]  [address] [place], [place] [address] [phone]      [1]  Allergies Allergen Reactions   Penicillins Shortness Of Breath, Swelling and Other (See Comments)    Childhood reaction   Sulfa Antibiotics Shortness Of Breath, Swelling and Other (See Comments)    Childhood reaction  [2]  Allergies Allergen Reactions   Penicillins Shortness Of Breath, Swelling and Other (See Comments)    Childhood reaction   Sulfa Antibiotics Shortness Of Breath, Swelling and Other (See Comments)    Childhood reaction   "

## 2024-11-09 ENCOUNTER — Ambulatory Visit: Admitting: Family Medicine
